# Patient Record
Sex: Female | Born: 1935 | Race: White | Hispanic: No | Marital: Married | Smoking: Never smoker
Health system: Southern US, Community
[De-identification: ages and names within clinical notes are randomized; demographics above are authoritative.]

## PROBLEM LIST (undated history)

## (undated) DIAGNOSIS — Z531 Procedure and treatment not carried out because of patient's decision for reasons of belief and group pressure: Secondary | ICD-10-CM

## (undated) DIAGNOSIS — F0150 Vascular dementia without behavioral disturbance: Secondary | ICD-10-CM

## (undated) DIAGNOSIS — IMO0001 Reserved for inherently not codable concepts without codable children: Secondary | ICD-10-CM

## (undated) DIAGNOSIS — I499 Cardiac arrhythmia, unspecified: Secondary | ICD-10-CM

## (undated) DIAGNOSIS — E039 Hypothyroidism, unspecified: Secondary | ICD-10-CM

## (undated) DIAGNOSIS — R609 Edema, unspecified: Secondary | ICD-10-CM

## (undated) DIAGNOSIS — G4730 Sleep apnea, unspecified: Secondary | ICD-10-CM

## (undated) DIAGNOSIS — I4891 Unspecified atrial fibrillation: Secondary | ICD-10-CM

## (undated) DIAGNOSIS — G459 Transient cerebral ischemic attack, unspecified: Secondary | ICD-10-CM

## (undated) DIAGNOSIS — R32 Unspecified urinary incontinence: Secondary | ICD-10-CM

## (undated) DIAGNOSIS — I639 Cerebral infarction, unspecified: Secondary | ICD-10-CM

## (undated) DIAGNOSIS — F0280 Dementia in other diseases classified elsewhere without behavioral disturbance: Secondary | ICD-10-CM

## (undated) HISTORY — PX: COLONOSCOPY: SHX174

## (undated) HISTORY — PX: FOOT SURGERY: SHX648

## (undated) HISTORY — PX: CHOLECYSTECTOMY: SHX55

---

## 2005-03-26 ENCOUNTER — Ambulatory Visit: Payer: Self-pay | Admitting: Internal Medicine

## 2005-04-19 ENCOUNTER — Ambulatory Visit: Payer: Self-pay | Admitting: Internal Medicine

## 2005-08-23 ENCOUNTER — Ambulatory Visit: Payer: Self-pay

## 2006-01-16 ENCOUNTER — Ambulatory Visit: Payer: Self-pay | Admitting: Gastroenterology

## 2006-05-01 ENCOUNTER — Ambulatory Visit: Payer: Self-pay | Admitting: Internal Medicine

## 2007-01-22 ENCOUNTER — Ambulatory Visit: Payer: Self-pay | Admitting: Internal Medicine

## 2007-07-08 ENCOUNTER — Ambulatory Visit: Payer: Self-pay | Admitting: Physician Assistant

## 2007-07-19 ENCOUNTER — Ambulatory Visit: Payer: Self-pay | Admitting: Internal Medicine

## 2008-01-28 ENCOUNTER — Ambulatory Visit: Payer: Self-pay | Admitting: Internal Medicine

## 2009-03-21 ENCOUNTER — Ambulatory Visit: Payer: Self-pay | Admitting: Internal Medicine

## 2010-05-22 ENCOUNTER — Ambulatory Visit: Payer: Self-pay | Admitting: Internal Medicine

## 2010-08-17 ENCOUNTER — Observation Stay: Payer: Self-pay | Admitting: Internal Medicine

## 2011-07-22 ENCOUNTER — Ambulatory Visit: Payer: Self-pay | Admitting: Internal Medicine

## 2012-01-23 ENCOUNTER — Ambulatory Visit: Payer: Self-pay | Admitting: Gastroenterology

## 2012-01-24 LAB — PATHOLOGY REPORT

## 2012-09-21 ENCOUNTER — Ambulatory Visit: Payer: Self-pay | Admitting: Internal Medicine

## 2013-09-02 ENCOUNTER — Ambulatory Visit: Payer: Self-pay | Admitting: Internal Medicine

## 2014-02-07 DIAGNOSIS — I4891 Unspecified atrial fibrillation: Secondary | ICD-10-CM | POA: Diagnosis not present

## 2014-04-21 DIAGNOSIS — G4733 Obstructive sleep apnea (adult) (pediatric): Secondary | ICD-10-CM | POA: Diagnosis not present

## 2014-04-21 DIAGNOSIS — I4891 Unspecified atrial fibrillation: Secondary | ICD-10-CM | POA: Diagnosis not present

## 2014-04-21 DIAGNOSIS — E039 Hypothyroidism, unspecified: Secondary | ICD-10-CM | POA: Diagnosis not present

## 2014-04-21 DIAGNOSIS — N3941 Urge incontinence: Secondary | ICD-10-CM | POA: Diagnosis not present

## 2014-04-28 DIAGNOSIS — E039 Hypothyroidism, unspecified: Secondary | ICD-10-CM | POA: Diagnosis not present

## 2014-04-28 DIAGNOSIS — I480 Paroxysmal atrial fibrillation: Secondary | ICD-10-CM | POA: Diagnosis not present

## 2014-04-28 DIAGNOSIS — G4733 Obstructive sleep apnea (adult) (pediatric): Secondary | ICD-10-CM | POA: Diagnosis not present

## 2014-04-28 DIAGNOSIS — N3941 Urge incontinence: Secondary | ICD-10-CM | POA: Diagnosis not present

## 2014-05-24 DIAGNOSIS — I4891 Unspecified atrial fibrillation: Secondary | ICD-10-CM | POA: Diagnosis not present

## 2014-06-03 DIAGNOSIS — N390 Urinary tract infection, site not specified: Secondary | ICD-10-CM | POA: Diagnosis not present

## 2014-06-03 DIAGNOSIS — E039 Hypothyroidism, unspecified: Secondary | ICD-10-CM | POA: Diagnosis not present

## 2014-06-03 DIAGNOSIS — N3941 Urge incontinence: Secondary | ICD-10-CM | POA: Diagnosis not present

## 2014-06-03 DIAGNOSIS — R300 Dysuria: Secondary | ICD-10-CM | POA: Diagnosis not present

## 2014-06-03 DIAGNOSIS — G4733 Obstructive sleep apnea (adult) (pediatric): Secondary | ICD-10-CM | POA: Diagnosis not present

## 2014-06-03 DIAGNOSIS — I480 Paroxysmal atrial fibrillation: Secondary | ICD-10-CM | POA: Diagnosis not present

## 2014-06-07 DIAGNOSIS — I4891 Unspecified atrial fibrillation: Secondary | ICD-10-CM | POA: Diagnosis not present

## 2014-06-21 DIAGNOSIS — I480 Paroxysmal atrial fibrillation: Secondary | ICD-10-CM | POA: Diagnosis not present

## 2014-07-21 DIAGNOSIS — I480 Paroxysmal atrial fibrillation: Secondary | ICD-10-CM | POA: Diagnosis not present

## 2014-08-23 DIAGNOSIS — I480 Paroxysmal atrial fibrillation: Secondary | ICD-10-CM | POA: Diagnosis not present

## 2014-09-09 DIAGNOSIS — I480 Paroxysmal atrial fibrillation: Secondary | ICD-10-CM | POA: Diagnosis not present

## 2014-09-09 DIAGNOSIS — G4733 Obstructive sleep apnea (adult) (pediatric): Secondary | ICD-10-CM | POA: Diagnosis not present

## 2014-09-09 DIAGNOSIS — N3941 Urge incontinence: Secondary | ICD-10-CM | POA: Diagnosis not present

## 2014-09-09 DIAGNOSIS — E039 Hypothyroidism, unspecified: Secondary | ICD-10-CM | POA: Diagnosis not present

## 2014-09-16 DIAGNOSIS — I480 Paroxysmal atrial fibrillation: Secondary | ICD-10-CM | POA: Diagnosis not present

## 2014-09-16 DIAGNOSIS — E039 Hypothyroidism, unspecified: Secondary | ICD-10-CM | POA: Diagnosis not present

## 2014-09-16 DIAGNOSIS — Z0000 Encounter for general adult medical examination without abnormal findings: Secondary | ICD-10-CM | POA: Diagnosis not present

## 2014-09-16 DIAGNOSIS — N3941 Urge incontinence: Secondary | ICD-10-CM | POA: Diagnosis not present

## 2014-09-22 DIAGNOSIS — I480 Paroxysmal atrial fibrillation: Secondary | ICD-10-CM | POA: Diagnosis not present

## 2014-11-11 DIAGNOSIS — Z23 Encounter for immunization: Secondary | ICD-10-CM | POA: Diagnosis not present

## 2014-11-23 DIAGNOSIS — I480 Paroxysmal atrial fibrillation: Secondary | ICD-10-CM | POA: Diagnosis not present

## 2015-01-11 DIAGNOSIS — R0789 Other chest pain: Secondary | ICD-10-CM | POA: Diagnosis not present

## 2015-01-11 DIAGNOSIS — I480 Paroxysmal atrial fibrillation: Secondary | ICD-10-CM | POA: Diagnosis not present

## 2015-01-11 DIAGNOSIS — R110 Nausea: Secondary | ICD-10-CM | POA: Diagnosis not present

## 2015-01-11 DIAGNOSIS — R079 Chest pain, unspecified: Secondary | ICD-10-CM | POA: Diagnosis not present

## 2015-01-24 DIAGNOSIS — H2513 Age-related nuclear cataract, bilateral: Secondary | ICD-10-CM | POA: Diagnosis not present

## 2015-02-16 DIAGNOSIS — I480 Paroxysmal atrial fibrillation: Secondary | ICD-10-CM | POA: Diagnosis not present

## 2015-02-16 DIAGNOSIS — E039 Hypothyroidism, unspecified: Secondary | ICD-10-CM | POA: Diagnosis not present

## 2015-02-16 DIAGNOSIS — Z6841 Body Mass Index (BMI) 40.0 and over, adult: Secondary | ICD-10-CM | POA: Diagnosis not present

## 2015-02-16 DIAGNOSIS — G4733 Obstructive sleep apnea (adult) (pediatric): Secondary | ICD-10-CM | POA: Diagnosis not present

## 2015-02-16 DIAGNOSIS — N3941 Urge incontinence: Secondary | ICD-10-CM | POA: Diagnosis not present

## 2015-03-02 DIAGNOSIS — I482 Chronic atrial fibrillation: Secondary | ICD-10-CM | POA: Diagnosis not present

## 2015-03-02 DIAGNOSIS — R410 Disorientation, unspecified: Secondary | ICD-10-CM | POA: Diagnosis not present

## 2015-03-02 DIAGNOSIS — K219 Gastro-esophageal reflux disease without esophagitis: Secondary | ICD-10-CM | POA: Diagnosis not present

## 2015-03-02 DIAGNOSIS — I517 Cardiomegaly: Secondary | ICD-10-CM | POA: Diagnosis not present

## 2015-03-02 DIAGNOSIS — R4182 Altered mental status, unspecified: Secondary | ICD-10-CM | POA: Diagnosis not present

## 2015-03-02 DIAGNOSIS — R51 Headache: Secondary | ICD-10-CM | POA: Diagnosis not present

## 2015-03-02 DIAGNOSIS — M1990 Unspecified osteoarthritis, unspecified site: Secondary | ICD-10-CM | POA: Diagnosis not present

## 2015-03-02 DIAGNOSIS — R791 Abnormal coagulation profile: Secondary | ICD-10-CM | POA: Diagnosis not present

## 2015-03-02 DIAGNOSIS — I4891 Unspecified atrial fibrillation: Secondary | ICD-10-CM | POA: Diagnosis not present

## 2015-03-02 DIAGNOSIS — N390 Urinary tract infection, site not specified: Secondary | ICD-10-CM | POA: Diagnosis not present

## 2015-03-02 DIAGNOSIS — E785 Hyperlipidemia, unspecified: Secondary | ICD-10-CM | POA: Diagnosis not present

## 2015-03-02 DIAGNOSIS — G9389 Other specified disorders of brain: Secondary | ICD-10-CM | POA: Diagnosis not present

## 2015-03-02 DIAGNOSIS — I639 Cerebral infarction, unspecified: Secondary | ICD-10-CM | POA: Diagnosis not present

## 2015-03-02 DIAGNOSIS — R4781 Slurred speech: Secondary | ICD-10-CM | POA: Diagnosis not present

## 2015-03-02 DIAGNOSIS — I10 Essential (primary) hypertension: Secondary | ICD-10-CM | POA: Diagnosis not present

## 2015-03-02 DIAGNOSIS — Z8673 Personal history of transient ischemic attack (TIA), and cerebral infarction without residual deficits: Secondary | ICD-10-CM | POA: Diagnosis not present

## 2015-03-02 DIAGNOSIS — G459 Transient cerebral ischemic attack, unspecified: Secondary | ICD-10-CM | POA: Diagnosis not present

## 2015-03-02 DIAGNOSIS — Z7901 Long term (current) use of anticoagulants: Secondary | ICD-10-CM | POA: Diagnosis not present

## 2015-04-05 DIAGNOSIS — H2513 Age-related nuclear cataract, bilateral: Secondary | ICD-10-CM | POA: Diagnosis not present

## 2015-04-05 DIAGNOSIS — I480 Paroxysmal atrial fibrillation: Secondary | ICD-10-CM | POA: Diagnosis not present

## 2015-04-06 DIAGNOSIS — N3941 Urge incontinence: Secondary | ICD-10-CM | POA: Diagnosis not present

## 2015-04-06 DIAGNOSIS — I480 Paroxysmal atrial fibrillation: Secondary | ICD-10-CM | POA: Diagnosis not present

## 2015-04-06 DIAGNOSIS — G4733 Obstructive sleep apnea (adult) (pediatric): Secondary | ICD-10-CM | POA: Diagnosis not present

## 2015-04-06 DIAGNOSIS — Z8673 Personal history of transient ischemic attack (TIA), and cerebral infarction without residual deficits: Secondary | ICD-10-CM | POA: Diagnosis not present

## 2015-04-06 DIAGNOSIS — E039 Hypothyroidism, unspecified: Secondary | ICD-10-CM | POA: Diagnosis not present

## 2015-04-06 DIAGNOSIS — Z6841 Body Mass Index (BMI) 40.0 and over, adult: Secondary | ICD-10-CM | POA: Diagnosis not present

## 2015-04-06 DIAGNOSIS — H2589 Other age-related cataract: Secondary | ICD-10-CM | POA: Diagnosis not present

## 2015-04-06 DIAGNOSIS — Z01818 Encounter for other preprocedural examination: Secondary | ICD-10-CM | POA: Diagnosis not present

## 2015-04-13 NOTE — Pre-Procedure Instructions (Signed)
CLEARED BY DR Ginette Pitman 04/06/15

## 2015-04-20 ENCOUNTER — Ambulatory Visit: Payer: Commercial Managed Care - HMO | Admitting: Anesthesiology

## 2015-04-20 ENCOUNTER — Encounter
Admission: RE | Disposition: A | Discharge status: Home / Self Care | Payer: Self-pay | Source: Ambulatory Visit | Attending: Ophthalmology

## 2015-04-20 ENCOUNTER — Ambulatory Visit
Admission: RE | Admit: 2015-04-20 | Discharge: 2015-04-20 | Disposition: A | Discharge status: Home / Self Care | Payer: Commercial Managed Care - HMO | Source: Ambulatory Visit | Attending: Ophthalmology | Admitting: Ophthalmology

## 2015-04-20 DIAGNOSIS — H2511 Age-related nuclear cataract, right eye: Secondary | ICD-10-CM | POA: Diagnosis not present

## 2015-04-20 DIAGNOSIS — I499 Cardiac arrhythmia, unspecified: Secondary | ICD-10-CM | POA: Insufficient documentation

## 2015-04-20 DIAGNOSIS — E039 Hypothyroidism, unspecified: Secondary | ICD-10-CM | POA: Insufficient documentation

## 2015-04-20 DIAGNOSIS — Z8673 Personal history of transient ischemic attack (TIA), and cerebral infarction without residual deficits: Secondary | ICD-10-CM | POA: Insufficient documentation

## 2015-04-20 DIAGNOSIS — I4891 Unspecified atrial fibrillation: Secondary | ICD-10-CM | POA: Diagnosis not present

## 2015-04-20 DIAGNOSIS — G4730 Sleep apnea, unspecified: Secondary | ICD-10-CM | POA: Diagnosis not present

## 2015-04-20 DIAGNOSIS — Z9049 Acquired absence of other specified parts of digestive tract: Secondary | ICD-10-CM | POA: Diagnosis not present

## 2015-04-20 DIAGNOSIS — Z91041 Radiographic dye allergy status: Secondary | ICD-10-CM | POA: Diagnosis not present

## 2015-04-20 DIAGNOSIS — H2513 Age-related nuclear cataract, bilateral: Secondary | ICD-10-CM | POA: Diagnosis not present

## 2015-04-20 HISTORY — PX: CATARACT EXTRACTION W/PHACO: SHX586

## 2015-04-20 HISTORY — DX: Hypothyroidism, unspecified: E03.9

## 2015-04-20 HISTORY — DX: Cardiac arrhythmia, unspecified: I49.9

## 2015-04-20 HISTORY — DX: Reserved for inherently not codable concepts without codable children: IMO0001

## 2015-04-20 HISTORY — DX: Sleep apnea, unspecified: G47.30

## 2015-04-20 HISTORY — DX: Cerebral infarction, unspecified: I63.9

## 2015-04-20 HISTORY — DX: Transient cerebral ischemic attack, unspecified: G45.9

## 2015-04-20 SURGERY — PHACOEMULSIFICATION, CATARACT, WITH IOL INSERTION
Anesthesia: Monitor Anesthesia Care | Laterality: Right | Wound class: Clean

## 2015-04-20 MED ORDER — EPINEPHRINE HCL 1 MG/ML IJ SOLN
INTRAMUSCULAR | Status: DC | PRN
  Administered 2015-04-20: 250 mL via OPHTHALMIC

## 2015-04-20 MED ORDER — NA HYALUR & NA CHOND-NA HYALUR 0.4-0.35 ML IO KIT
PACK | INTRAOCULAR | Status: DC | PRN
  Administered 2015-04-20: .75 mL via INTRAOCULAR

## 2015-04-20 MED ORDER — NA HYALUR & NA CHOND-NA HYALUR 0.55-0.5 ML IO KIT
PACK | INTRAOCULAR | Status: AC
  Filled 2015-04-20: qty 1.05

## 2015-04-20 MED ORDER — CEFUROXIME OPHTHALMIC INJECTION 1 MG/0.1 ML
INJECTION | OPHTHALMIC | Status: AC
  Filled 2015-04-20: qty 0.1

## 2015-04-20 MED ORDER — EPINEPHRINE HCL 1 MG/ML IJ SOLN
INTRAMUSCULAR | Status: AC
  Filled 2015-04-20: qty 1

## 2015-04-20 MED ORDER — POVIDONE-IODINE 5 % OP SOLN
OPHTHALMIC | Status: AC
  Administered 2015-04-20: 1 via OPHTHALMIC
  Filled 2015-04-20: qty 30

## 2015-04-20 MED ORDER — POVIDONE-IODINE 5 % OP SOLN
1.0000 "application " | OPHTHALMIC | Status: AC | PRN
  Administered 2015-04-20: 1 via OPHTHALMIC

## 2015-04-20 MED ORDER — CEFUROXIME OPHTHALMIC INJECTION 1 MG/0.1 ML
INJECTION | OPHTHALMIC | Status: DC | PRN
  Administered 2015-04-20: 0.1 mL via INTRACAMERAL

## 2015-04-20 MED ORDER — ARMC OPHTHALMIC DILATING GEL
OPHTHALMIC | Status: AC
  Administered 2015-04-20: 1 via OPHTHALMIC
  Filled 2015-04-20: qty 0.25

## 2015-04-20 MED ORDER — TETRACAINE HCL 0.5 % OP SOLN
1.0000 [drp] | OPHTHALMIC | Status: AC | PRN
  Administered 2015-04-20: 1 [drp] via OPHTHALMIC

## 2015-04-20 MED ORDER — NEOMYCIN-POLYMYXIN-DEXAMETH 3.5-10000-0.1 OP OINT
TOPICAL_OINTMENT | OPHTHALMIC | Status: AC
  Filled 2015-04-20: qty 3.5

## 2015-04-20 MED ORDER — CARBACHOL 0.01 % IO SOLN
INTRAOCULAR | Status: DC | PRN
  Administered 2015-04-20: 0.5 mL via INTRAOCULAR

## 2015-04-20 MED ORDER — MOXIFLOXACIN HCL 0.5 % OP SOLN: 1.0000 [drp] | Freq: Once | OPHTHALMIC | Status: DC

## 2015-04-20 MED ORDER — LABETALOL HCL 5 MG/ML IV SOLN
INTRAVENOUS | Status: DC | PRN
  Administered 2015-04-20 (×2): 5 mg via INTRAVENOUS

## 2015-04-20 MED ORDER — SODIUM CHLORIDE 0.9 % IV SOLN
INTRAVENOUS | Status: DC
  Administered 2015-04-20: 07:00:00 via INTRAVENOUS

## 2015-04-20 MED ORDER — NEOMYCIN-POLYMYXIN-DEXAMETH 0.1 % OP OINT
TOPICAL_OINTMENT | OPHTHALMIC | Status: DC | PRN
  Administered 2015-04-20: 1 via OPHTHALMIC

## 2015-04-20 MED ORDER — MOXIFLOXACIN HCL 0.5 % OP SOLN
OPHTHALMIC | Status: AC
  Filled 2015-04-20: qty 3

## 2015-04-20 MED ORDER — TETRACAINE HCL 0.5 % OP SOLN
OPHTHALMIC | Status: AC
  Administered 2015-04-20: 1 [drp] via OPHTHALMIC
  Filled 2015-04-20: qty 2

## 2015-04-20 MED ORDER — ARMC OPHTHALMIC DILATING GEL
1.0000 "application " | OPHTHALMIC | Status: DC | PRN
  Administered 2015-04-20: 1 via OPHTHALMIC

## 2015-04-20 SURGICAL SUPPLY — 23 items
BSS 15ML ×3 IMPLANT
CANNULA ANT/CHMB 27GA (MISCELLANEOUS) ×3 IMPLANT
CUP MEDICINE 2OZ PLAST GRAD ST (MISCELLANEOUS) IMPLANT
GLOVE BIO SURGEON STRL SZ8 (GLOVE) ×3 IMPLANT
GLOVE BIOGEL M 6.5 STRL (GLOVE) ×3 IMPLANT
GLOVE SURG LX 7.5 STRW (GLOVE) ×2
GLOVE SURG LX STRL 7.5 STRW (GLOVE) ×1 IMPLANT
GOWN STRL REUS W/ TWL LRG LVL3 (GOWN DISPOSABLE) ×2 IMPLANT
GOWN STRL REUS W/TWL LRG LVL3 (GOWN DISPOSABLE) ×4
LENS IOL TECNIS 21.5 (Intraocular Lens) ×3 IMPLANT
LENS IOL TECNIS MONO 1P 21.5 (Intraocular Lens) ×1 IMPLANT
PACK CATARACT (MISCELLANEOUS) ×3 IMPLANT
PACK CATARACT BRASINGTON LX (MISCELLANEOUS) ×3 IMPLANT
PACK EYE AFTER SURG (MISCELLANEOUS) ×3 IMPLANT
SOL BSS BAG (MISCELLANEOUS) ×3
SOL PREP PVP 2OZ (MISCELLANEOUS)
SOLUTION BSS BAG (MISCELLANEOUS) ×1 IMPLANT
SOLUTION PREP PVP 2OZ (MISCELLANEOUS) IMPLANT
SYR 3ML LL SCALE MARK (SYRINGE) ×3 IMPLANT
SYR 5ML LL (SYRINGE) ×6 IMPLANT
SYR TB 1ML 27GX1/2 LL (SYRINGE) ×3 IMPLANT
WATER STERILE IRR 1000ML POUR (IV SOLUTION) ×3 IMPLANT
WIPE NON LINTING 3.25X3.25 (MISCELLANEOUS) ×3 IMPLANT

## 2015-04-20 NOTE — Op Note (Signed)
OPERATIVE NOTE  Nicole Walton [id number] 04/20/2015   PREOPERATIVE DIAGNOSIS:  Nuclear Sclerotic Cataract Right Eye H25.11   POSTOPERATIVE DIAGNOSIS: Nuclear Sclerotic Cataract Right Eye H25.11          PROCEDURE:  Phacoemusification with posterior chamber intraocular lens placement of the right eye   LENS:   Implant Name Type Inv. Item Serial No. Manufacturer Lot No. LRB No. Used  LENS IOL TECNIS 21.5 - [id number] Intraocular Lens LENS IOL TECNIS 21.5 [id number]   Right 1       ULTRASOUND TIME: 16 %  of 1 minutes 12 seconds, CDE 11.7  SURGEON:  Wyonia Hough, MD   ANESTHESIA:  Topical with tetracaine drops and 2% Xylocaine jelly.   COMPLICATIONS:  None.   DESCRIPTION OF PROCEDURE:  The patient was identified in the holding room and transported to the operating room and placed in the supine position under the operating microscope. Theright eye was identified as the operative eye and it was prepped and draped in the usual sterile ophthalmic fashion.   A 1 millimeter clear-corneal paracentesis was made at the 12:00 position.  The anterior chamber was filled with Viscoat viscoelastic.  A 2.4 millimeter keratome was used to make a near-clear corneal incision at the 9:00 position. A curvilinear capsulorrhexis was made with a cystotome and capsulorrhexis forceps.  Balanced salt solution was used to hydrodissect and hydrodelineate the nucleus.   Phacoemulsification was then used in stop and chop fashion to remove the lens nucleus and epinucleus.  The remaining cortex was then removed using the irrigation and aspiration handpiece. Provisc was then placed into the capsular bag to distend it for lens placement.  A lens was then injected into the capsular bag.  The remaining viscoelastic was aspirated.  Wounds were hydrated with balanced salt solution.  The anterior chamber was inflated to a physiologic pressure with balanced salt solution. Cefuroxime 0.1 ml of a 10mg /ml solution  was injected into the anterior chamber for a dose of 1 mg of intracameral antibiotic at the completion of the case. Miostat was placed into the anterior chamber to constrict the pupil.  No wound leaks were noted.  Topical Vigamox drops and Maxitrol ointment were applied to the eye.  The patient was taken to the recovery room in stable condition without complications of anesthesia or surgery.  [name] 04/20/2015, 8:58 AM

## 2015-04-20 NOTE — Anesthesia Preprocedure Evaluation (Signed)
Anesthesia Evaluation  Patient identified by MRN, date of birth, ID band Patient awake    Reviewed: Allergy & Precautions, H&P , NPO status , Patient's Chart, lab work & pertinent test results  History of Anesthesia Complications Negative for: history of anesthetic complications  Airway Mallampati: III  TM Distance: >3 FB Neck ROM: limited    Dental  (+) Poor Dentition, Chipped, Missing, Upper Dentures, Lower Dentures   Pulmonary shortness of breath, sleep apnea and Continuous Positive Airway Pressure Ventilation ,    Pulmonary exam normal breath sounds clear to auscultation       Cardiovascular Exercise Tolerance: Good (-) angina(-) Past MI and (-) DOE Normal cardiovascular exam+ dysrhythmias  Rhythm:regular Rate:Normal     Neuro/Psych TIACVA negative psych ROS   GI/Hepatic negative GI ROS, Neg liver ROS, neg GERD  ,  Endo/Other  Hypothyroidism   Renal/GU negative Renal ROS  negative genitourinary   Musculoskeletal   Abdominal   Peds  Hematology negative hematology ROS (+)   Anesthesia Other Findings Past Medical History:   Sleep apnea                                                  Shortness of breath dyspnea                                    Comment:with exersion   Dysrhythmia                                                    Comment:A-fib   Stroke (HCC)                                                 TIA (transient ischemic attack)                              Hypothyroidism                                              Past Surgical History:   CESAREAN SECTION                                              CHOLECYSTECTOMY                                               FOOT SURGERY  BMI    Body Mass Index   38.62 kg/m 2      Reproductive/Obstetrics negative OB ROS                             Anesthesia Physical Anesthesia  Plan  ASA: III  Anesthesia Plan: MAC   Post-op Pain Management:    Induction:   Airway Management Planned:   Additional Equipment:   Intra-op Plan:   Post-operative Plan:   Informed Consent: I have reviewed the patients History and Physical, chart, labs and discussed the procedure including the risks, benefits and alternatives for the proposed anesthesia with the patient or authorized representative who has indicated his/her understanding and acceptance.   Dental Advisory Given  Plan Discussed with: Anesthesiologist, CRNA and Surgeon  Anesthesia Plan Comments:         Anesthesia Quick Evaluation

## 2015-04-20 NOTE — Discharge Instructions (Signed)
AMBULATORY SURGERY  DISCHARGE INSTRUCTIONS   1) The drugs that you were given will stay in your system until tomorrow so for the next 24 hours you should not:  A) Drive an automobile B) Make any legal decisions C) Drink any alcoholic beverage   2) You may resume regular meals tomorrow.  Today it is better to start with liquids and gradually work up to solid foods.  You may eat anything you prefer, but it is better to start with liquids, then soup and crackers, and gradually work up to solid foods.   3) Please notify your doctor immediately if you have any unusual bleeding, trouble breathing, redness and pain at the surgery site, drainage, fever, or pain not relieved by medication.    4) Additional Instructions:    Eye Surgery Discharge Instructions  Expect mild scratchy sensation or mild soreness. DO NOT RUB YOUR EYE!  The day of surgery:  Minimal physical activity, but bed rest is not required  No reading, computer work, or close hand work  No bending, lifting, or straining.  May watch TV  For 24 hours:  No driving, legal decisions, or alcoholic beverages  Safety precautions  Eat anything you prefer: It is better to start with liquids, then soup then solid foods.  _____ Eye patch should be worn until postoperative exam tomorrow.  ____ Solar shield eyeglasses should be worn for comfort in the sunlight/patch while sleeping  Resume all regular medications including aspirin or Coumadin if these were discontinued prior to surgery. You may shower, bathe, shave, or wash your hair. Tylenol may be taken for mild discomfort.  Call your doctor if you experience significant pain, nausea, or vomiting, fever > 101 or other signs of infection. [phone] or [phone] Specific instructions:  Follow-up Information    Follow up with Leandrew Koyanagi, MD In 1 day.   Specialty:  Ophthalmology   Why:  March 17 at 10:40am   Contact information:   [address]   [place] [address] [phone]         Please contact your physician with any problems or Same Day Surgery at [phone], Monday through Friday 6 am to 4 pm, or [place] at [facility] number at [phone].

## 2015-04-20 NOTE — Transfer of Care (Signed)
Immediate Anesthesia Transfer of Care Note  Patient: Nicole Walton  Procedure(s) Performed: Procedure(s) with comments: CATARACT EXTRACTION PHACO AND INTRAOCULAR LENS PLACEMENT (IOC) (Right) - [place]   1:12.1 AP%  16.2 CDE   11.69 fluid cassette lot# [id number]   exp.07/04/2016  Patient Location: PACU  Anesthesia Type:MAC  Level of Consciousness: awake, alert  and oriented  Airway & Oxygen Therapy: Patient Spontanous Breathing  Post-op Assessment: Report given to RN and Post -op Vital signs reviewed and stable  Post vital signs: stable  Last Vitals:  Filed Vitals:   04/20/15 0705 04/20/15 0903  BP: 172/89 166/92  Pulse: 63 68  Temp: 37 C 36.9 C  Resp: 16 12    Complications: No apparent anesthesia complications

## 2015-04-20 NOTE — OR Nursing (Signed)
Patient reports to her knowledge  Never taking Cipro however she list it as an allergy because her son had an allergy to it.  Discussed with Dr Wallace Going.  Order received to proceed with ordered drops.

## 2015-04-20 NOTE — H&P (Signed)
  The History and Physical notes are on paper, have been signed, and are to be scanned. The patient remains stable and unchanged from the H&P.   Previous H&P reviewed, patient examined, and there are no changes.  Gwenlyn Hottinger 04/20/2015 8:36 AM

## 2015-04-20 NOTE — Anesthesia Postprocedure Evaluation (Signed)
Anesthesia Post Note  Patient: Nicole Walton  Procedure(s) Performed: Procedure(s) (LRB): CATARACT EXTRACTION PHACO AND INTRAOCULAR LENS PLACEMENT (IOC) (Right)  Patient location during evaluation: PACU Anesthesia Type: MAC Level of consciousness: awake and alert and oriented Pain management: pain level controlled Vital Signs Assessment: post-procedure vital signs reviewed and stable Respiratory status: spontaneous breathing Cardiovascular status: blood pressure returned to baseline Anesthetic complications: no    Last Vitals:  Filed Vitals:   04/20/15 0705 04/20/15 0903  BP: 172/89 166/92  Pulse: 63 68  Temp: 37 C 36.9 C  Resp: 16 12    Last Pain: There were no vitals filed for this visit.               Estill Batten

## 2015-05-08 DIAGNOSIS — I480 Paroxysmal atrial fibrillation: Secondary | ICD-10-CM | POA: Diagnosis not present

## 2015-05-25 DIAGNOSIS — H2512 Age-related nuclear cataract, left eye: Secondary | ICD-10-CM | POA: Diagnosis not present

## 2015-05-30 ENCOUNTER — Encounter: Payer: Self-pay | Admitting: *Deleted

## 2015-05-31 NOTE — Progress Notes (Signed)
Called [facility] about allergy to Cipro. [facility] returned call to state this is patients 2nd eye and with her first eye she did well with medications as ordered with no reactions so continue to use medications for eye as ordered this time as well.

## 2015-06-01 ENCOUNTER — Ambulatory Visit
Admission: RE | Admit: 2015-06-01 | Discharge: 2015-06-01 | Disposition: A | Discharge status: Home / Self Care | Payer: Commercial Managed Care - HMO | Source: Ambulatory Visit | Attending: Ophthalmology | Admitting: Ophthalmology

## 2015-06-01 ENCOUNTER — Ambulatory Visit: Payer: Commercial Managed Care - HMO | Admitting: Anesthesiology

## 2015-06-01 ENCOUNTER — Encounter
Admission: RE | Disposition: A | Discharge status: Home / Self Care | Payer: Self-pay | Source: Ambulatory Visit | Attending: Ophthalmology

## 2015-06-01 DIAGNOSIS — Z9841 Cataract extraction status, right eye: Secondary | ICD-10-CM | POA: Insufficient documentation

## 2015-06-01 DIAGNOSIS — G4730 Sleep apnea, unspecified: Secondary | ICD-10-CM | POA: Insufficient documentation

## 2015-06-01 DIAGNOSIS — Z9049 Acquired absence of other specified parts of digestive tract: Secondary | ICD-10-CM | POA: Insufficient documentation

## 2015-06-01 DIAGNOSIS — Z9109 Other allergy status, other than to drugs and biological substances: Secondary | ICD-10-CM | POA: Diagnosis not present

## 2015-06-01 DIAGNOSIS — Z79891 Long term (current) use of opiate analgesic: Secondary | ICD-10-CM | POA: Insufficient documentation

## 2015-06-01 DIAGNOSIS — E039 Hypothyroidism, unspecified: Secondary | ICD-10-CM | POA: Insufficient documentation

## 2015-06-01 DIAGNOSIS — Z79899 Other long term (current) drug therapy: Secondary | ICD-10-CM | POA: Diagnosis not present

## 2015-06-01 DIAGNOSIS — Z7902 Long term (current) use of antithrombotics/antiplatelets: Secondary | ICD-10-CM | POA: Diagnosis not present

## 2015-06-01 DIAGNOSIS — Z7901 Long term (current) use of anticoagulants: Secondary | ICD-10-CM | POA: Insufficient documentation

## 2015-06-01 DIAGNOSIS — Z9889 Other specified postprocedural states: Secondary | ICD-10-CM | POA: Insufficient documentation

## 2015-06-01 DIAGNOSIS — H269 Unspecified cataract: Secondary | ICD-10-CM | POA: Diagnosis present

## 2015-06-01 DIAGNOSIS — Z91041 Radiographic dye allergy status: Secondary | ICD-10-CM | POA: Diagnosis not present

## 2015-06-01 DIAGNOSIS — H2512 Age-related nuclear cataract, left eye: Secondary | ICD-10-CM | POA: Diagnosis not present

## 2015-06-01 DIAGNOSIS — Z8673 Personal history of transient ischemic attack (TIA), and cerebral infarction without residual deficits: Secondary | ICD-10-CM | POA: Insufficient documentation

## 2015-06-01 DIAGNOSIS — I4891 Unspecified atrial fibrillation: Secondary | ICD-10-CM | POA: Insufficient documentation

## 2015-06-01 HISTORY — DX: Unspecified urinary incontinence: R32

## 2015-06-01 HISTORY — DX: Edema, unspecified: R60.9

## 2015-06-01 HISTORY — PX: CATARACT EXTRACTION W/PHACO: SHX586

## 2015-06-01 SURGERY — PHACOEMULSIFICATION, CATARACT, WITH IOL INSERTION
Anesthesia: Monitor Anesthesia Care | Site: Eye | Laterality: Left | Wound class: Clean

## 2015-06-01 MED ORDER — LIDOCAINE HCL (PF) 4 % IJ SOLN
INTRAMUSCULAR | Status: AC
  Filled 2015-06-01: qty 5

## 2015-06-01 MED ORDER — MOXIFLOXACIN HCL 0.5 % OP SOLN: 1.0000 [drp] | OPHTHALMIC | Status: DC | PRN

## 2015-06-01 MED ORDER — NEOMYCIN-POLYMYXIN-DEXAMETH 3.5-10000-0.1 OP OINT
TOPICAL_OINTMENT | OPHTHALMIC | Status: AC
  Filled 2015-06-01: qty 3.5

## 2015-06-01 MED ORDER — EPINEPHRINE HCL 1 MG/ML IJ SOLN
INTRAMUSCULAR | Status: AC
  Filled 2015-06-01: qty 1

## 2015-06-01 MED ORDER — BUPIVACAINE HCL (PF) 0.75 % IJ SOLN
INTRAMUSCULAR | Status: AC
  Filled 2015-06-01: qty 10

## 2015-06-01 MED ORDER — BSS IO SOLN
INTRAOCULAR | Status: DC | PRN
  Administered 2015-06-01: 08:00:00 via OPHTHALMIC

## 2015-06-01 MED ORDER — MOXIFLOXACIN HCL 0.5 % OP SOLN
OPHTHALMIC | Status: AC
  Filled 2015-06-01: qty 3

## 2015-06-01 MED ORDER — ARMC OPHTHALMIC DILATING GEL
1.0000 "application " | OPHTHALMIC | Status: AC | PRN
  Administered 2015-06-01 (×2): 1 via OPHTHALMIC

## 2015-06-01 MED ORDER — CEFUROXIME OPHTHALMIC INJECTION 1 MG/0.1 ML
INJECTION | OPHTHALMIC | Status: DC | PRN
  Administered 2015-06-01: 0.1 mL via INTRACAMERAL

## 2015-06-01 MED ORDER — CEFUROXIME OPHTHALMIC INJECTION 1 MG/0.1 ML
INJECTION | OPHTHALMIC | Status: AC
  Filled 2015-06-01: qty 0.1

## 2015-06-01 MED ORDER — NA HYALUR & NA CHOND-NA HYALUR 0.55-0.5 ML IO KIT
PACK | INTRAOCULAR | Status: AC
  Filled 2015-06-01: qty 1.05

## 2015-06-01 MED ORDER — CARBACHOL 0.01 % IO SOLN
INTRAOCULAR | Status: DC | PRN
  Administered 2015-06-01: 0.5 mL via INTRAOCULAR

## 2015-06-01 MED ORDER — POVIDONE-IODINE 5 % OP SOLN
OPHTHALMIC | Status: AC
  Administered 2015-06-01: 1 via OPHTHALMIC
  Filled 2015-06-01: qty 30

## 2015-06-01 MED ORDER — SODIUM CHLORIDE 0.9 % IV SOLN
INTRAVENOUS | Status: DC
  Administered 2015-06-01: 07:00:00 via INTRAVENOUS

## 2015-06-01 MED ORDER — POVIDONE-IODINE 5 % OP SOLN
1.0000 "application " | Freq: Once | OPHTHALMIC | Status: AC
  Administered 2015-06-01: 1 via OPHTHALMIC

## 2015-06-01 MED ORDER — NEOMYCIN-POLYMYXIN-DEXAMETH 0.1 % OP OINT
TOPICAL_OINTMENT | OPHTHALMIC | Status: DC | PRN
  Administered 2015-06-01: 1 via OPHTHALMIC

## 2015-06-01 MED ORDER — LIDOCAINE HCL (PF) 4 % IJ SOLN
INTRAMUSCULAR | Status: DC | PRN
  Administered 2015-06-01: 08:00:00 via OPHTHALMIC

## 2015-06-01 MED ORDER — TETRACAINE HCL 0.5 % OP SOLN
1.0000 [drp] | Freq: Once | OPHTHALMIC | Status: AC
  Administered 2015-06-01: 1 [drp] via OPHTHALMIC

## 2015-06-01 MED ORDER — TETRACAINE HCL 0.5 % OP SOLN
OPHTHALMIC | Status: AC
  Administered 2015-06-01: 1 [drp] via OPHTHALMIC
  Filled 2015-06-01: qty 2

## 2015-06-01 MED ORDER — LIDOCAINE HCL (PF) 1 % IJ SOLN
INTRAMUSCULAR | Status: AC
  Filled 2015-06-01: qty 2

## 2015-06-01 MED ORDER — FENTANYL CITRATE (PF) 100 MCG/2ML IJ SOLN
INTRAMUSCULAR | Status: DC | PRN
  Administered 2015-06-01: 25 ug via INTRAVENOUS

## 2015-06-01 MED ORDER — NA HYALUR & NA CHOND-NA HYALUR 0.4-0.35 ML IO KIT
PACK | INTRAOCULAR | Status: DC | PRN
  Administered 2015-06-01: .35 mL via INTRAOCULAR

## 2015-06-01 MED ORDER — ARMC OPHTHALMIC DILATING GEL
OPHTHALMIC | Status: AC
  Administered 2015-06-01: 1 via OPHTHALMIC
  Filled 2015-06-01: qty 0.25

## 2015-06-01 MED ORDER — HYALURONIDASE HUMAN 150 UNIT/ML IJ SOLN
INTRAMUSCULAR | Status: AC
  Filled 2015-06-01: qty 1

## 2015-06-01 SURGICAL SUPPLY — 22 items
CANNULA ANT/CHMB 27GA (MISCELLANEOUS) ×3 IMPLANT
CUP MEDICINE 2OZ PLAST GRAD ST (MISCELLANEOUS) ×3 IMPLANT
GLOVE BIO SURGEON STRL SZ8 (GLOVE) ×3 IMPLANT
GLOVE BIOGEL M 6.5 STRL (GLOVE) ×3 IMPLANT
GLOVE SURG LX 7.5 STRW (GLOVE) ×2
GLOVE SURG LX STRL 7.5 STRW (GLOVE) ×1 IMPLANT
GOWN STRL REUS W/ TWL LRG LVL3 (GOWN DISPOSABLE) ×2 IMPLANT
GOWN STRL REUS W/TWL LRG LVL3 (GOWN DISPOSABLE) ×4
LENS IOL TECNIS 21.5 (Intraocular Lens) ×3 IMPLANT
LENS IOL TECNIS MONO 1P 21.5 (Intraocular Lens) ×1 IMPLANT
PACK CATARACT (MISCELLANEOUS) ×3 IMPLANT
PACK CATARACT BRASINGTON LX (MISCELLANEOUS) ×3 IMPLANT
PACK EYE AFTER SURG (MISCELLANEOUS) ×3 IMPLANT
SOL BSS BAG (MISCELLANEOUS) ×3
SOL PREP PVP 2OZ (MISCELLANEOUS) ×3
SOLUTION BSS BAG (MISCELLANEOUS) ×1 IMPLANT
SOLUTION PREP PVP 2OZ (MISCELLANEOUS) ×1 IMPLANT
SYR 3ML LL SCALE MARK (SYRINGE) ×3 IMPLANT
SYR 5ML LL (SYRINGE) ×3 IMPLANT
SYR TB 1ML 27GX1/2 LL (SYRINGE) ×3 IMPLANT
WATER STERILE IRR 1000ML POUR (IV SOLUTION) ×3 IMPLANT
WIPE NON LINTING 3.25X3.25 (MISCELLANEOUS) ×3 IMPLANT

## 2015-06-01 NOTE — Op Note (Signed)
OPERATIVE NOTE  Nicole Walton [id number] 06/01/2015   PREOPERATIVE DIAGNOSIS:  Nuclear sclerotic cataract left eye. H25.12   POSTOPERATIVE DIAGNOSIS:    Nuclear sclerotic cataract left eye.     PROCEDURE:  Phacoemusification with posterior chamber intraocular lens placement of the left eye   LENS:   Implant Name Type Inv. Item Serial No. Manufacturer Lot No. LRB No. Used  LENS IOL TECNIS 21.5 - [id number] Intraocular Lens LENS IOL TECNIS 21.5 [id number]   Left 1        ULTRASOUND TIME: 10 % of 1 minutes, 17 seconds.  CDE 7.7   SURGEON:  Wyonia Hough, MD   ANESTHESIA:  Topical with tetracaine drops and 2% Xylocaine jelly, augmented with 1% preservative-free intracameral lidocaine.    COMPLICATIONS:  None.   DESCRIPTION OF PROCEDURE:  The patient was identified in the holding room and transported to the operating room and placed in the supine position under the operating microscope.  The left eye was identified as the operative eye and it was prepped and draped in the usual sterile ophthalmic fashion.   A 1 millimeter clear-corneal paracentesis was made at the 1:30 position. 0.5 ml of preservative-free 1% lidocaine was injected into the anterior chamber.  The anterior chamber was filled with Viscoat viscoelastic.  A 2.4 millimeter keratome was used to make a near-clear corneal incision at the 10:30 position.  .  A curvilinear capsulorrhexis was made with a cystotome and capsulorrhexis forceps.  Balanced salt solution was used to hydrodissect and hydrodelineate the nucleus.   Phacoemulsification was then used in stop and chop fashion to remove the lens nucleus and epinucleus.  The remaining cortex was then removed using the irrigation and aspiration handpiece. Provisc was then placed into the capsular bag to distend it for lens placement.  A lens was then injected into the capsular bag.  The remaining viscoelastic was aspirated.   Wounds were hydrated with balanced salt  solution.  The anterior chamber was inflated to a physiologic pressure with balanced salt solution. Cefuroxime 0.1 ml of a 10mg /ml solution was injected into the anterior chamber for a dose of 1 mg of intracameral antibiotic at the completion of the case.  Miostat was placed into the anterior chamber to constrict the pupil.  No wound leaks were noted.  Topical Vigamox drops and Maxitrol ointment were applied to the eye.  The patient was taken to the recovery room in stable condition without complications of anesthesia or surgery  Nicole Walton 06/01/2015, 8:34 AM

## 2015-06-01 NOTE — Anesthesia Postprocedure Evaluation (Signed)
Anesthesia Post Note  Patient: Nicole Walton  Procedure(s) Performed: Procedure(s) (LRB): CATARACT EXTRACTION PHACO AND INTRAOCULAR LENS PLACEMENT (IOC) (Left)  Patient location during evaluation: Short Stay Anesthesia Type: MAC Level of consciousness: awake and alert and oriented Pain management: pain level controlled Vital Signs Assessment: post-procedure vital signs reviewed and stable Respiratory status: spontaneous breathing Cardiovascular status: stable Postop Assessment: no signs of nausea or vomiting Anesthetic complications: no    Last Vitals:  Filed Vitals:   06/01/15 0646  BP: 162/92  Pulse: 60  Temp: 36.5 C  Resp: 16    Last Pain: There were no vitals filed for this visit.               [name]

## 2015-06-01 NOTE — Transfer of Care (Signed)
Immediate Anesthesia Transfer of Care Note  Patient: Brelyn M Boley  Procedure(s) Performed: Procedure(s) with comments: CATARACT EXTRACTION PHACO AND INTRAOCULAR LENS PLACEMENT (IOC) (Left) - [place] 1.17 AP% 9.9 CDE 765 Fluid Pack Lot # [id number]  Patient Location: Short Stay  Anesthesia Type:MAC  Level of Consciousness: awake, alert  and oriented  Airway & Oxygen Therapy: Patient Spontanous Breathing and Patient connected to nasal cannula oxygen  Post-op Assessment: Report given to RN and Post -op Vital signs reviewed and stable  Post vital signs: Reviewed and stable  Last Vitals: 0837 67 hr 99% sat 97.8 temp 106/79 bp 18 resp  Filed Vitals:   06/01/15 0646  BP: 162/92  Pulse: 60  Temp: 36.5 C  Resp: 16    Last Pain: There were no vitals filed for this visit.       Complications: No apparent anesthesia complications

## 2015-06-01 NOTE — Discharge Instructions (Signed)
AMBULATORY SURGERY  DISCHARGE INSTRUCTIONS   1) The drugs that you were given will stay in your system until tomorrow so for the next 24 hours you should not:  A) Drive an automobile B) Make any legal decisions C) Drink any alcoholic beverage   2) You may resume regular meals tomorrow.  Today it is better to start with liquids and gradually work up to solid foods.  You may eat anything you prefer, but it is better to start with liquids, then soup and crackers, and gradually work up to solid foods.   3) Please notify your doctor immediately if you have any unusual bleeding, trouble breathing, redness and pain at the surgery site, drainage, fever, or pain not relieved by medication.    4) Additional Instructions:        Please contact your physician with any problems or Same Day Surgery at [phone], Monday through Friday 6 am to 4 pm, or [place] at [facility] number at [phone].   Eye Surgery Discharge Instructions  Expect mild scratchy sensation or mild soreness. DO NOT RUB YOUR EYE!  The day of surgery:  Minimal physical activity, but bed rest is not required  No reading, computer work, or close hand work  No bending, lifting, or straining.  May watch TV  For 24 hours:  No driving, legal decisions, or alcoholic beverages  Safety precautions  Eat anything you prefer: It is better to start with liquids, then soup then solid foods.  _____ Eye patch should be worn until postoperative exam tomorrow.  ____ Solar shield eyeglasses should be worn for comfort in the sunlight/patch while sleeping  Resume all regular medications including aspirin or Coumadin if these were discontinued prior to surgery. You may shower, bathe, shave, or wash your hair. Tylenol may be taken for mild discomfort.  Call your doctor if you experience significant pain, nausea, or vomiting, fever > 101 or other signs of infection. [phone] or [phone] Specific  instructions:  Follow-up Information    Follow up with Leandrew Koyanagi, MD In 1 day.   Specialty:  Ophthalmology   Why:  April 28 at 9:35am   Contact information:   [address]   [place] [address] [phone]

## 2015-06-01 NOTE — H&P (Signed)
  The History and Physical notes are on paper, have been signed, and are to be scanned. The patient remains stable and unchanged from the H&P.   Previous H&P reviewed, patient examined, and there are no changes.  Nicole Walton 06/01/2015 7:29 AM

## 2015-06-01 NOTE — Anesthesia Preprocedure Evaluation (Signed)
Anesthesia Evaluation  Patient identified by MRN, date of birth, ID band Patient awake    Reviewed: Allergy & Precautions, H&P , NPO status , Patient's Chart, lab work & pertinent test results  History of Anesthesia Complications Negative for: history of anesthetic complications  Airway Mallampati: III  TM Distance: >3 FB Neck ROM: limited    Dental  (+) Poor Dentition, Chipped, Missing, Upper Dentures, Lower Dentures   Pulmonary shortness of breath, sleep apnea and Continuous Positive Airway Pressure Ventilation ,    Pulmonary exam normal breath sounds clear to auscultation       Cardiovascular Exercise Tolerance: Good (-) angina(-) Past MI and (-) DOE Normal cardiovascular exam+ dysrhythmias  Rhythm:regular Rate:Normal     Neuro/Psych TIACVA negative psych ROS   GI/Hepatic negative GI ROS, Neg liver ROS, neg GERD  ,  Endo/Other  Hypothyroidism   Renal/GU negative Renal ROS  negative genitourinary   Musculoskeletal   Abdominal   Peds  Hematology negative hematology ROS (+)   Anesthesia Other Findings Past Medical History:   Sleep apnea                                                  Shortness of breath dyspnea                                    Comment:with exersion   Dysrhythmia                                                    Comment:A-fib   Stroke (HCC)                                                 TIA (transient ischemic attack)                              Hypothyroidism                                              Past Surgical History:   CESAREAN SECTION                                              CHOLECYSTECTOMY                                               FOOT SURGERY  BMI    Body Mass Index   38.62 kg/m 2      Reproductive/Obstetrics negative OB ROS                             Anesthesia Physical  Anesthesia  Plan  ASA: III  Anesthesia Plan: MAC   Post-op Pain Management:    Induction:   Airway Management Planned:   Additional Equipment:   Intra-op Plan:   Post-operative Plan:   Informed Consent: I have reviewed the patients History and Physical, chart, labs and discussed the procedure including the risks, benefits and alternatives for the proposed anesthesia with the patient or authorized representative who has indicated his/her understanding and acceptance.   Dental Advisory Given  Plan Discussed with: Anesthesiologist, CRNA and Surgeon  Anesthesia Plan Comments:         Anesthesia Quick Evaluation

## 2015-06-08 DIAGNOSIS — E039 Hypothyroidism, unspecified: Secondary | ICD-10-CM | POA: Diagnosis not present

## 2015-06-08 DIAGNOSIS — N3941 Urge incontinence: Secondary | ICD-10-CM | POA: Diagnosis not present

## 2015-06-08 DIAGNOSIS — I480 Paroxysmal atrial fibrillation: Secondary | ICD-10-CM | POA: Diagnosis not present

## 2015-06-08 DIAGNOSIS — G4733 Obstructive sleep apnea (adult) (pediatric): Secondary | ICD-10-CM | POA: Diagnosis not present

## 2015-06-08 DIAGNOSIS — Z6841 Body Mass Index (BMI) 40.0 and over, adult: Secondary | ICD-10-CM | POA: Diagnosis not present

## 2015-06-15 DIAGNOSIS — M79672 Pain in left foot: Secondary | ICD-10-CM | POA: Diagnosis not present

## 2015-06-15 DIAGNOSIS — E039 Hypothyroidism, unspecified: Secondary | ICD-10-CM | POA: Diagnosis not present

## 2015-06-15 DIAGNOSIS — Z8673 Personal history of transient ischemic attack (TIA), and cerebral infarction without residual deficits: Secondary | ICD-10-CM | POA: Diagnosis not present

## 2015-06-15 DIAGNOSIS — Z23 Encounter for immunization: Secondary | ICD-10-CM | POA: Diagnosis not present

## 2015-06-15 DIAGNOSIS — Z6841 Body Mass Index (BMI) 40.0 and over, adult: Secondary | ICD-10-CM | POA: Diagnosis not present

## 2015-06-15 DIAGNOSIS — G4733 Obstructive sleep apnea (adult) (pediatric): Secondary | ICD-10-CM | POA: Diagnosis not present

## 2015-06-15 DIAGNOSIS — N3941 Urge incontinence: Secondary | ICD-10-CM | POA: Diagnosis not present

## 2015-06-15 DIAGNOSIS — I480 Paroxysmal atrial fibrillation: Secondary | ICD-10-CM | POA: Diagnosis not present

## 2015-06-15 DIAGNOSIS — M7732 Calcaneal spur, left foot: Secondary | ICD-10-CM | POA: Diagnosis not present

## 2015-06-27 DIAGNOSIS — M722 Plantar fascial fibromatosis: Secondary | ICD-10-CM | POA: Diagnosis not present

## 2015-06-27 DIAGNOSIS — M7732 Calcaneal spur, left foot: Secondary | ICD-10-CM | POA: Diagnosis not present

## 2015-06-28 ENCOUNTER — Encounter: Payer: Self-pay | Admitting: Ophthalmology

## 2015-07-18 DIAGNOSIS — I480 Paroxysmal atrial fibrillation: Secondary | ICD-10-CM | POA: Diagnosis not present

## 2015-07-25 DIAGNOSIS — M722 Plantar fascial fibromatosis: Secondary | ICD-10-CM | POA: Diagnosis not present

## 2015-07-25 DIAGNOSIS — M7732 Calcaneal spur, left foot: Secondary | ICD-10-CM | POA: Diagnosis not present

## 2015-08-16 DIAGNOSIS — I480 Paroxysmal atrial fibrillation: Secondary | ICD-10-CM | POA: Diagnosis not present

## 2015-09-13 DIAGNOSIS — I480 Paroxysmal atrial fibrillation: Secondary | ICD-10-CM | POA: Diagnosis not present

## 2015-10-20 DIAGNOSIS — I480 Paroxysmal atrial fibrillation: Secondary | ICD-10-CM | POA: Diagnosis not present

## 2015-10-31 DIAGNOSIS — I480 Paroxysmal atrial fibrillation: Secondary | ICD-10-CM | POA: Diagnosis not present

## 2015-10-31 DIAGNOSIS — N3941 Urge incontinence: Secondary | ICD-10-CM | POA: Diagnosis not present

## 2015-10-31 DIAGNOSIS — M79672 Pain in left foot: Secondary | ICD-10-CM | POA: Diagnosis not present

## 2015-10-31 DIAGNOSIS — E039 Hypothyroidism, unspecified: Secondary | ICD-10-CM | POA: Diagnosis not present

## 2015-10-31 DIAGNOSIS — Z23 Encounter for immunization: Secondary | ICD-10-CM | POA: Diagnosis not present

## 2015-10-31 DIAGNOSIS — G4733 Obstructive sleep apnea (adult) (pediatric): Secondary | ICD-10-CM | POA: Diagnosis not present

## 2015-10-31 DIAGNOSIS — Z8673 Personal history of transient ischemic attack (TIA), and cerebral infarction without residual deficits: Secondary | ICD-10-CM | POA: Diagnosis not present

## 2015-10-31 DIAGNOSIS — Z6841 Body Mass Index (BMI) 40.0 and over, adult: Secondary | ICD-10-CM | POA: Diagnosis not present

## 2015-11-06 DIAGNOSIS — M545 Low back pain: Secondary | ICD-10-CM | POA: Diagnosis not present

## 2015-11-06 DIAGNOSIS — I480 Paroxysmal atrial fibrillation: Secondary | ICD-10-CM | POA: Diagnosis not present

## 2015-11-06 DIAGNOSIS — Z6841 Body Mass Index (BMI) 40.0 and over, adult: Secondary | ICD-10-CM | POA: Diagnosis not present

## 2015-11-06 DIAGNOSIS — G8929 Other chronic pain: Secondary | ICD-10-CM | POA: Diagnosis not present

## 2015-11-06 DIAGNOSIS — E039 Hypothyroidism, unspecified: Secondary | ICD-10-CM | POA: Diagnosis not present

## 2015-11-06 DIAGNOSIS — Z23 Encounter for immunization: Secondary | ICD-10-CM | POA: Diagnosis not present

## 2015-11-06 DIAGNOSIS — G4733 Obstructive sleep apnea (adult) (pediatric): Secondary | ICD-10-CM | POA: Diagnosis not present

## 2015-11-06 DIAGNOSIS — Z0000 Encounter for general adult medical examination without abnormal findings: Secondary | ICD-10-CM | POA: Diagnosis not present

## 2015-11-30 DIAGNOSIS — I480 Paroxysmal atrial fibrillation: Secondary | ICD-10-CM | POA: Diagnosis not present

## 2016-01-04 DIAGNOSIS — I480 Paroxysmal atrial fibrillation: Secondary | ICD-10-CM | POA: Diagnosis not present

## 2016-01-08 DIAGNOSIS — H04123 Dry eye syndrome of bilateral lacrimal glands: Secondary | ICD-10-CM | POA: Diagnosis not present

## 2016-01-31 DIAGNOSIS — I480 Paroxysmal atrial fibrillation: Secondary | ICD-10-CM | POA: Diagnosis not present

## 2016-02-08 DIAGNOSIS — G4733 Obstructive sleep apnea (adult) (pediatric): Secondary | ICD-10-CM | POA: Diagnosis not present

## 2016-02-20 DIAGNOSIS — R350 Frequency of micturition: Secondary | ICD-10-CM | POA: Diagnosis not present

## 2016-02-20 DIAGNOSIS — I4891 Unspecified atrial fibrillation: Secondary | ICD-10-CM | POA: Diagnosis not present

## 2016-02-20 DIAGNOSIS — G8929 Other chronic pain: Secondary | ICD-10-CM | POA: Diagnosis not present

## 2016-02-20 DIAGNOSIS — M545 Low back pain: Secondary | ICD-10-CM | POA: Diagnosis not present

## 2016-02-24 ENCOUNTER — Encounter: Payer: Self-pay | Admitting: Emergency Medicine

## 2016-02-24 ENCOUNTER — Inpatient Hospital Stay
Admission: EM | Admit: 2016-02-24 | Discharge: 2016-02-25 | DRG: 103 | Disposition: A | Discharge status: Home / Self Care | Payer: Medicare HMO | Attending: Internal Medicine | Admitting: Internal Medicine

## 2016-02-24 ENCOUNTER — Emergency Department: Payer: Medicare HMO

## 2016-02-24 DIAGNOSIS — Z9989 Dependence on other enabling machines and devices: Secondary | ICD-10-CM | POA: Diagnosis not present

## 2016-02-24 DIAGNOSIS — Z880 Allergy status to penicillin: Secondary | ICD-10-CM

## 2016-02-24 DIAGNOSIS — Z91041 Radiographic dye allergy status: Secondary | ICD-10-CM | POA: Diagnosis not present

## 2016-02-24 DIAGNOSIS — R519 Headache, unspecified: Secondary | ICD-10-CM

## 2016-02-24 DIAGNOSIS — Z79891 Long term (current) use of opiate analgesic: Secondary | ICD-10-CM | POA: Diagnosis not present

## 2016-02-24 DIAGNOSIS — I480 Paroxysmal atrial fibrillation: Secondary | ICD-10-CM | POA: Diagnosis not present

## 2016-02-24 DIAGNOSIS — Z7902 Long term (current) use of antithrombotics/antiplatelets: Secondary | ICD-10-CM

## 2016-02-24 DIAGNOSIS — R531 Weakness: Secondary | ICD-10-CM | POA: Diagnosis not present

## 2016-02-24 DIAGNOSIS — Z8673 Personal history of transient ischemic attack (TIA), and cerebral infarction without residual deficits: Secondary | ICD-10-CM | POA: Diagnosis not present

## 2016-02-24 DIAGNOSIS — R4781 Slurred speech: Secondary | ICD-10-CM | POA: Diagnosis present

## 2016-02-24 DIAGNOSIS — Z7901 Long term (current) use of anticoagulants: Secondary | ICD-10-CM

## 2016-02-24 DIAGNOSIS — N390 Urinary tract infection, site not specified: Secondary | ICD-10-CM | POA: Diagnosis present

## 2016-02-24 DIAGNOSIS — E039 Hypothyroidism, unspecified: Secondary | ICD-10-CM | POA: Diagnosis present

## 2016-02-24 DIAGNOSIS — Z79899 Other long term (current) drug therapy: Secondary | ICD-10-CM

## 2016-02-24 DIAGNOSIS — N3000 Acute cystitis without hematuria: Secondary | ICD-10-CM | POA: Diagnosis not present

## 2016-02-24 DIAGNOSIS — R110 Nausea: Secondary | ICD-10-CM | POA: Diagnosis present

## 2016-02-24 DIAGNOSIS — R32 Unspecified urinary incontinence: Secondary | ICD-10-CM | POA: Diagnosis not present

## 2016-02-24 DIAGNOSIS — I6523 Occlusion and stenosis of bilateral carotid arteries: Secondary | ICD-10-CM | POA: Diagnosis not present

## 2016-02-24 DIAGNOSIS — I482 Chronic atrial fibrillation: Secondary | ICD-10-CM | POA: Diagnosis not present

## 2016-02-24 DIAGNOSIS — R262 Difficulty in walking, not elsewhere classified: Secondary | ICD-10-CM

## 2016-02-24 DIAGNOSIS — G459 Transient cerebral ischemic attack, unspecified: Secondary | ICD-10-CM | POA: Diagnosis not present

## 2016-02-24 DIAGNOSIS — Z8249 Family history of ischemic heart disease and other diseases of the circulatory system: Secondary | ICD-10-CM

## 2016-02-24 DIAGNOSIS — R5381 Other malaise: Secondary | ICD-10-CM

## 2016-02-24 DIAGNOSIS — R51 Headache: Secondary | ICD-10-CM | POA: Diagnosis not present

## 2016-02-24 DIAGNOSIS — G451 Carotid artery syndrome (hemispheric): Secondary | ICD-10-CM | POA: Diagnosis not present

## 2016-02-24 DIAGNOSIS — G4730 Sleep apnea, unspecified: Secondary | ICD-10-CM | POA: Diagnosis not present

## 2016-02-24 DIAGNOSIS — Z881 Allergy status to other antibiotic agents status: Secondary | ICD-10-CM

## 2016-02-24 DIAGNOSIS — M6281 Muscle weakness (generalized): Secondary | ICD-10-CM

## 2016-02-24 LAB — CBC
HEMATOCRIT: 42.4 % (ref 35.0–47.0)
Hemoglobin: 14.5 g/dL (ref 12.0–16.0)
MCH: 31.6 pg (ref 26.0–34.0)
MCHC: 34.3 g/dL (ref 32.0–36.0)
MCV: 92.2 fL (ref 80.0–100.0)
Platelets: 221 10*3/uL (ref 150–440)
RBC: 4.6 MIL/uL (ref 3.80–5.20)
RDW: 13.6 % (ref 11.5–14.5)
WBC: 6.6 10*3/uL (ref 3.6–11.0)

## 2016-02-24 LAB — COMPREHENSIVE METABOLIC PANEL
ALT: 13 U/L — ABNORMAL LOW (ref 14–54)
ANION GAP: 6 (ref 5–15)
AST: 24 U/L (ref 15–41)
Albumin: 4.1 g/dL (ref 3.5–5.0)
Alkaline Phosphatase: 73 U/L (ref 38–126)
BILIRUBIN TOTAL: 1.1 mg/dL (ref 0.3–1.2)
BUN: 16 mg/dL (ref 6–20)
CO2: 29 mmol/L (ref 22–32)
Calcium: 8.9 mg/dL (ref 8.9–10.3)
Chloride: 103 mmol/L (ref 101–111)
Creatinine, Ser: 0.87 mg/dL (ref 0.44–1.00)
GFR calc Af Amer: >60 mL/min (ref >60)
Glucose, Bld: 133 mg/dL — ABNORMAL HIGH (ref 65–99)
POTASSIUM: 4.9 mmol/L (ref 3.5–5.1)
Sodium: 138 mmol/L (ref 135–145)
TOTAL PROTEIN: 8 g/dL (ref 6.5–8.1)

## 2016-02-24 LAB — PROTIME-INR
INR: 1.82
Prothrombin Time: 21.3 seconds — ABNORMAL HIGH (ref 11.4–15.2)

## 2016-02-24 LAB — TROPONIN I

## 2016-02-24 LAB — LIPASE, BLOOD: LIPASE: 17 U/L (ref 11–51)

## 2016-02-24 MED ORDER — ONDANSETRON 4 MG PO TBDP
4.0000 mg | ORAL_TABLET | Freq: Once | ORAL | Status: AC | PRN
Start: 2016-02-24 — End: 2016-02-24
  Administered 2016-02-24: 4 mg via ORAL

## 2016-02-24 MED ORDER — ONDANSETRON 4 MG PO TBDP
ORAL_TABLET | ORAL | Status: AC
  Filled 2016-02-24: qty 1

## 2016-02-24 MED ORDER — ONDANSETRON HCL 4 MG/2ML IJ SOLN
4.0000 mg | Freq: Once | INTRAMUSCULAR | Status: AC
  Administered 2016-02-24: 4 mg via INTRAVENOUS
  Filled 2016-02-24: qty 2

## 2016-02-24 MED ORDER — FENTANYL CITRATE (PF) 100 MCG/2ML IJ SOLN
50.0000 ug | Freq: Once | INTRAMUSCULAR | Status: AC
  Administered 2016-02-24: 50 ug via INTRAVENOUS
  Filled 2016-02-24: qty 2

## 2016-02-24 MED ORDER — SODIUM CHLORIDE 0.9 % IV BOLUS (SEPSIS)
500.0000 mL | Freq: Once | INTRAVENOUS | Status: AC
  Administered 2016-02-24: 500 mL via INTRAVENOUS

## 2016-02-24 NOTE — ED Notes (Addendum)
Pt's duaghter and son are now present and state they think pt is having a stroke because she has had a headache since noon. Daughter states her mother has been confused today and has had a headache since noon. Daughter states her father told her her mother's speech was slurred today. Pt appears unchanged at this time, cms intact in all extremities, speech clear.

## 2016-02-24 NOTE — ED Triage Notes (Addendum)
Pt states nausea that began at lunchtime. Pt states she did have generalized abd pain, but it is gone. Pt states "i just don't feel good." pt states she has also had a headache today. Pt denies diarrhea, but states she feels like "it could happen". resps unlabored. Pt drinking pepsi when this rn called her back for triage without difficulty.

## 2016-02-24 NOTE — ED Notes (Signed)
Pt to CT via wheelchair
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
## 2016-02-24 NOTE — ED Notes (Signed)
ED Provider at bedside. 

## 2016-02-25 ENCOUNTER — Inpatient Hospital Stay: Payer: Medicare HMO

## 2016-02-25 ENCOUNTER — Inpatient Hospital Stay
Admit: 2016-02-25 | Discharge: 2016-02-25 | Disposition: A | Discharge status: Home / Self Care | Payer: Medicare HMO | Attending: Internal Medicine | Admitting: Internal Medicine

## 2016-02-25 ENCOUNTER — Encounter: Payer: Self-pay | Admitting: Internal Medicine

## 2016-02-25 DIAGNOSIS — Z9989 Dependence on other enabling machines and devices: Secondary | ICD-10-CM | POA: Diagnosis not present

## 2016-02-25 DIAGNOSIS — G451 Carotid artery syndrome (hemispheric): Secondary | ICD-10-CM

## 2016-02-25 DIAGNOSIS — I480 Paroxysmal atrial fibrillation: Secondary | ICD-10-CM | POA: Diagnosis not present

## 2016-02-25 DIAGNOSIS — G459 Transient cerebral ischemic attack, unspecified: Secondary | ICD-10-CM | POA: Diagnosis present

## 2016-02-25 DIAGNOSIS — Z7902 Long term (current) use of antithrombotics/antiplatelets: Secondary | ICD-10-CM | POA: Diagnosis not present

## 2016-02-25 DIAGNOSIS — N390 Urinary tract infection, site not specified: Secondary | ICD-10-CM | POA: Diagnosis present

## 2016-02-25 DIAGNOSIS — R110 Nausea: Secondary | ICD-10-CM | POA: Diagnosis present

## 2016-02-25 DIAGNOSIS — R51 Headache: Secondary | ICD-10-CM | POA: Diagnosis not present

## 2016-02-25 DIAGNOSIS — R32 Unspecified urinary incontinence: Secondary | ICD-10-CM | POA: Diagnosis present

## 2016-02-25 DIAGNOSIS — I482 Chronic atrial fibrillation: Secondary | ICD-10-CM | POA: Diagnosis present

## 2016-02-25 DIAGNOSIS — Z8673 Personal history of transient ischemic attack (TIA), and cerebral infarction without residual deficits: Secondary | ICD-10-CM | POA: Diagnosis not present

## 2016-02-25 DIAGNOSIS — Z8249 Family history of ischemic heart disease and other diseases of the circulatory system: Secondary | ICD-10-CM | POA: Diagnosis not present

## 2016-02-25 DIAGNOSIS — Z880 Allergy status to penicillin: Secondary | ICD-10-CM | POA: Diagnosis not present

## 2016-02-25 DIAGNOSIS — Z91041 Radiographic dye allergy status: Secondary | ICD-10-CM | POA: Diagnosis not present

## 2016-02-25 DIAGNOSIS — N3000 Acute cystitis without hematuria: Secondary | ICD-10-CM | POA: Diagnosis present

## 2016-02-25 DIAGNOSIS — I6523 Occlusion and stenosis of bilateral carotid arteries: Secondary | ICD-10-CM | POA: Diagnosis not present

## 2016-02-25 DIAGNOSIS — Z881 Allergy status to other antibiotic agents status: Secondary | ICD-10-CM | POA: Diagnosis not present

## 2016-02-25 DIAGNOSIS — Z79891 Long term (current) use of opiate analgesic: Secondary | ICD-10-CM | POA: Diagnosis not present

## 2016-02-25 DIAGNOSIS — G4730 Sleep apnea, unspecified: Secondary | ICD-10-CM | POA: Diagnosis present

## 2016-02-25 DIAGNOSIS — Z79899 Other long term (current) drug therapy: Secondary | ICD-10-CM | POA: Diagnosis not present

## 2016-02-25 DIAGNOSIS — R4781 Slurred speech: Secondary | ICD-10-CM | POA: Diagnosis not present

## 2016-02-25 DIAGNOSIS — E039 Hypothyroidism, unspecified: Secondary | ICD-10-CM | POA: Diagnosis present

## 2016-02-25 DIAGNOSIS — Z7901 Long term (current) use of anticoagulants: Secondary | ICD-10-CM | POA: Diagnosis not present

## 2016-02-25 LAB — URINALYSIS, COMPLETE (UACMP) WITH MICROSCOPIC
BILIRUBIN URINE: NEGATIVE
Glucose, UA: NEGATIVE mg/dL
HGB URINE DIPSTICK: NEGATIVE
Ketones, ur: NEGATIVE mg/dL
NITRITE: NEGATIVE
PH: 7 (ref 5.0–8.0)
Protein, ur: NEGATIVE mg/dL
SPECIFIC GRAVITY, URINE: 1.017 (ref 1.005–1.030)

## 2016-02-25 LAB — LIPID PANEL
Cholesterol: 128 mg/dL (ref 0–200)
HDL: 47 mg/dL (ref >40)
LDL CALC: 64 mg/dL (ref 0–99)
Total CHOL/HDL Ratio: 2.7 RATIO
Triglycerides: 87 mg/dL (ref <150)
VLDL: 17 mg/dL (ref 0–40)

## 2016-02-25 MED ORDER — METOCLOPRAMIDE HCL 5 MG/ML IJ SOLN
2.5000 mg | Freq: Once | INTRAMUSCULAR | Status: AC
  Administered 2016-02-25: 2.5 mg via INTRAVENOUS

## 2016-02-25 MED ORDER — CHLORHEXIDINE GLUCONATE 0.12 % MT SOLN
15.0000 mL | Freq: Two times a day (BID) | OROMUCOSAL | Status: DC
  Administered 2016-02-25: 15 mL via OROMUCOSAL
  Filled 2016-02-25: qty 15

## 2016-02-25 MED ORDER — ASPIRIN 300 MG RE SUPP: 300.0000 mg | Freq: Every day | RECTAL | Status: DC

## 2016-02-25 MED ORDER — METOCLOPRAMIDE HCL 5 MG/ML IJ SOLN
INTRAMUSCULAR | Status: AC
  Administered 2016-02-25: 2.5 mg via INTRAVENOUS
  Filled 2016-02-25: qty 2

## 2016-02-25 MED ORDER — ACETAMINOPHEN 160 MG/5ML PO SOLN: 650.0000 mg | ORAL | Status: DC | PRN

## 2016-02-25 MED ORDER — CLOPIDOGREL BISULFATE 75 MG PO TABS
75.0000 mg | ORAL_TABLET | Freq: Every day | ORAL | Status: DC
  Administered 2016-02-25: 75 mg via ORAL
  Filled 2016-02-25: qty 1

## 2016-02-25 MED ORDER — STROKE: EARLY STAGES OF RECOVERY BOOK
Freq: Once | Status: AC
  Administered 2016-02-25: 06:00:00

## 2016-02-25 MED ORDER — ACETAMINOPHEN 325 MG PO TABS: 650.0000 mg | ORAL_TABLET | ORAL | Status: DC | PRN

## 2016-02-25 MED ORDER — WARFARIN SODIUM 4 MG PO TABS: 4.0000 mg | ORAL_TABLET | ORAL | Status: DC

## 2016-02-25 MED ORDER — SODIUM CHLORIDE 0.9 % IV SOLN
INTRAVENOUS | Status: DC
  Administered 2016-02-25: 06:00:00 via INTRAVENOUS

## 2016-02-25 MED ORDER — CEPHALEXIN 500 MG PO CAPS: 500.0000 mg | ORAL_CAPSULE | Freq: Three times a day (TID) | ORAL | 0 refills | Status: DC

## 2016-02-25 MED ORDER — NITROGLYCERIN 0.4 MG SL SUBL: 0.4000 mg | SUBLINGUAL_TABLET | SUBLINGUAL | Status: DC | PRN

## 2016-02-25 MED ORDER — WARFARIN SODIUM 4 MG PO TABS: 5.0000 mg | ORAL_TABLET | ORAL | Status: DC

## 2016-02-25 MED ORDER — CEFTRIAXONE SODIUM 1 G IJ SOLR: 1.0000 g | INTRAMUSCULAR | Status: DC

## 2016-02-25 MED ORDER — LORAZEPAM 2 MG/ML IJ SOLN
0.5000 mg | Freq: Once | INTRAMUSCULAR | Status: AC
  Administered 2016-02-25: 0.5 mg via INTRAVENOUS
  Filled 2016-02-25: qty 1

## 2016-02-25 MED ORDER — OXYBUTYNIN CHLORIDE 5 MG PO TABS
5.0000 mg | ORAL_TABLET | Freq: Three times a day (TID) | ORAL | Status: DC
  Administered 2016-02-25: 5 mg via ORAL
  Filled 2016-02-25: qty 1

## 2016-02-25 MED ORDER — WARFARIN - PHYSICIAN DOSING INPATIENT: Freq: Every day | Status: DC

## 2016-02-25 MED ORDER — SENNOSIDES-DOCUSATE SODIUM 8.6-50 MG PO TABS: 1.0000 | ORAL_TABLET | Freq: Every evening | ORAL | Status: DC | PRN

## 2016-02-25 MED ORDER — ASPIRIN 325 MG PO TABS
325.0000 mg | ORAL_TABLET | Freq: Every day | ORAL | Status: DC
  Administered 2016-02-25: 325 mg via ORAL
  Filled 2016-02-25: qty 1

## 2016-02-25 MED ORDER — CEFTRIAXONE SODIUM-DEXTROSE 1-3.74 GM-% IV SOLR
1.0000 g | INTRAVENOUS | Status: DC
  Administered 2016-02-25: 1 g via INTRAVENOUS
  Filled 2016-02-25: qty 50

## 2016-02-25 MED ORDER — KETOROLAC TROMETHAMINE 15 MG/ML IJ SOLN
15.0000 mg | Freq: Once | INTRAMUSCULAR | Status: AC
  Administered 2016-02-25: 14:00:00 15 mg via INTRAVENOUS
  Filled 2016-02-25: qty 1

## 2016-02-25 MED ORDER — ACETAMINOPHEN 650 MG RE SUPP: 650.0000 mg | RECTAL | Status: DC | PRN

## 2016-02-25 MED ORDER — HYDROCODONE-ACETAMINOPHEN 5-325 MG PO TABS: 1.0000 | ORAL_TABLET | Freq: Two times a day (BID) | ORAL | Status: DC | PRN

## 2016-02-25 MED ORDER — DILTIAZEM HCL ER COATED BEADS 120 MG PO CP24
120.0000 mg | ORAL_CAPSULE | Freq: Every day | ORAL | Status: DC
Start: 2016-02-25 — End: 2016-02-25
  Administered 2016-02-25: 08:00:00 120 mg via ORAL
  Filled 2016-02-25: qty 1

## 2016-02-25 MED ORDER — LEVOTHYROXINE SODIUM 50 MCG PO TABS
50.0000 ug | ORAL_TABLET | Freq: Every day | ORAL | Status: DC
  Administered 2016-02-25: 08:00:00 50 ug via ORAL
  Filled 2016-02-25: qty 1

## 2016-02-25 MED ORDER — CEFTRIAXONE SODIUM-DEXTROSE 1-3.74 GM-% IV SOLR: 1.0000 g | INTRAVENOUS | Status: DC

## 2016-02-25 MED ORDER — MAGNESIUM SULFATE 2 GM/50ML IV SOLN
2.0000 g | Freq: Once | INTRAVENOUS | Status: AC
  Administered 2016-02-25: 14:00:00 2 g via INTRAVENOUS
  Filled 2016-02-25: qty 50

## 2016-02-25 MED ORDER — ATENOLOL 50 MG PO TABS: 25.0000 mg | ORAL_TABLET | Freq: Every day | ORAL | Status: DC

## 2016-02-25 NOTE — Progress Notes (Signed)
Pharmacy Antibiotic Note  Nicole Walton is a 83 y.o. female admitted on 02/24/2016 with UTI.  Pharmacy has been consulted for ceftriaxone dosing.  Plan: Ceftriaxone 1 gm IV Q24H  Height: 5\' 4"  (162.6 cm) Weight: 212 lb (96.2 kg) IBW/kg (Calculated) : 54.7  Temp (24hrs), Avg:98.3 F (36.8 C), Min:98.3 F (36.8 C), Max:98.3 F (36.8 C)   Recent Labs Lab 02/24/16 2057  WBC 6.6  CREATININE 0.87    Estimated Creatinine Clearance: 56.1 mL/min (by C-G formula based on SCr of 0.87 mg/dL).    Allergies  Allergen Reactions  . Ciprofloxacin Other (See Comments)    Not sure of reaction   . Contrast Media [Iodinated Diagnostic Agents] Swelling    Of feet and legs.  (Betadine is OK to use)  . Nitrofurantoin Other (See Comments)    GI distress  . Penicillin G Other (See Comments)    Pt doesn't know.     Thank you for allowing pharmacy to be a part of this patient's care.  Laural Benes, Pharm.D., BCPS Clinical Pharmacist 02/25/2016 2:57 AM

## 2016-02-25 NOTE — Discharge Summary (Signed)
[place] [facility] [place] NAME: Nicole Walton    MR#:  [id number]  DATE OF BIRTH:  September 24, 1931  DATE OF ADMISSION:  02/24/2016 ADMITTING PHYSICIAN: Saundra Shelling, MD  DATE OF DISCHARGE: 02/25/2016  PRIMARY CARE PHYSICIAN: Tracie Harrier, MD    ADMISSION DIAGNOSIS:  Nausea [R11.0] Malaise [R53.81] Weakness [R53.1] Urinary tract infection without hematuria, site unspecified [N39.0] Acute nonintractable headache, unspecified headache type [R51]  DISCHARGE DIAGNOSIS:  Headache Acute cystitis without hematuria  SECONDARY DIAGNOSIS:   Past Medical History:  Diagnosis Date  . Dysrhythmia    A-fib  . Edema    feet/legs  . Hypothyroidism   . Incontinence   . Shortness of breath dyspnea    with exersion  . Sleep apnea   . Stroke ([place])   . TIA (transient ischemic attack)     HOSPITAL COURSE:   1. Slurred speech, headache. Slurred speech has resolved. Headache still present will give a dose of Toradol and magnesium IV. MRI of the brain is negative this is not a stroke. Carotid ultrasound and echocardiogram done but not read at the time of discharge. Patient seen in consultation by neurology and cleared to go home. 2. Acute cystitis without hematuria. Started on Rocephin in the emergency room prescribed Keflex for a few more days at home. 3. History of old right frontal stroke in the past. On Coumadin and Plavix 4. History of sleep apnea on CPAP 5. Hypothyroidism unspecified on levothyroxine 6. History of paroxysmal atrial fibrillation on Cardizem and Coumadin  DISCHARGE CONDITIONS:   Follow-up PMD one week  CONSULTS OBTAINED:  Treatment Team:  Leotis Pain, MD  DRUG ALLERGIES:   Allergies  Allergen Reactions  . Ciprofloxacin Other (See Comments)    Not sure of reaction   . Contrast Media [Iodinated Diagnostic Agents] Swelling    Of feet and legs.  (Betadine is OK to use)  . Nitrofurantoin Other (See Comments)    GI  distress  . Penicillin G Other (See Comments)    Pt doesn't know.     DISCHARGE MEDICATIONS:   Current Discharge Medication List    START taking these medications   Details  cephALEXin (KEFLEX) 500 MG capsule Take 1 capsule (500 mg total) by mouth 3 (three) times daily. Qty: 6 capsule, Refills: 0      CONTINUE these medications which have NOT CHANGED   Details  clopidogrel (PLAVIX) 75 MG tablet Take 75 mg by mouth daily.    diltiazem (DILACOR XR) 120 MG 24 hr capsule Take 120 mg by mouth daily.    HYDROcodone-acetaminophen (NORCO/VICODIN) 5-325 MG tablet Take 1 tablet by mouth 2 (two) times daily as needed for moderate pain. Reported on 06/01/2015    levothyroxine (SYNTHROID, LEVOTHROID) 50 MCG tablet Take 50 mcg by mouth daily before breakfast.    nitroGLYCERIN (NITROSTAT) 0.4 MG SL tablet Place 0.4 mg under the tongue every 5 (five) minutes as needed for chest pain.    oxybutynin (DITROPAN) 5 MG tablet Take 5 mg by mouth 3 (three) times daily.    !! warfarin (COUMADIN) 4 MG tablet Take 4 mg by mouth every morning. Monday - Friday. Not on Saturdays and sundays.    !! warfarin (COUMADIN) 5 MG tablet Take 5 mg by mouth 2 (two) times a week. Saturday and Sunday.     !! - Potential duplicate medications found. Please discuss with provider.    STOP taking these medications     atenolol (TENORMIN) 25 MG tablet  doxycycline (VIBRA-TABS) 100 MG tablet          DISCHARGE INSTRUCTIONS:   Follow-up with PMD one week  If you experience worsening of your admission symptoms, develop shortness of breath, life threatening emergency, suicidal or homicidal thoughts you must seek medical attention immediately by calling 911 or calling your MD immediately  if symptoms less severe.  You Must read complete instructions/literature along with all the possible adverse reactions/side effects for all the Medicines you take and that have been prescribed to you. Take any new Medicines after  you have completely understood and accept all the possible adverse reactions/side effects.   Please note  You were cared for by a hospitalist during your hospital stay. If you have any questions about your discharge medications or the care you received while you were in the hospital after you are discharged, you can call the unit and asked to speak with the hospitalist on call if the hospitalist that took care of you is not available. Once you are discharged, your primary care physician will handle any further medical issues. Please note that NO REFILLS for any discharge medications will be authorized once you are discharged, as it is imperative that you return to your primary care physician (or establish a relationship with a primary care physician if you do not have one) for your aftercare needs so that they can reassess your need for medications and monitor your lab values.    Today   CHIEF COMPLAINT:   Chief Complaint  Patient presents with  . Nausea    HISTORY OF PRESENT ILLNESS:  Nicole Walton  is a 83 y.o. female with a known history of Came in with slurred speech headache and some nausea   VITAL SIGNS:  Blood pressure (!) 143/60, pulse 81, temperature 98 F (36.7 C), temperature source Oral, resp. rate 18, height 5\' 4"  (1.626 m), weight 96.2 kg (212 lb), SpO2 99 %.    PHYSICAL EXAMINATION:  GENERAL:  83 y.o.-year-old patient lying in the bed with no acute distress.  EYES: Pupils equal, round, reactive to light and accommodation. No scleral icterus. Extraocular muscles intact.  HEENT: Head atraumatic, normocephalic. Oropharynx and nasopharynx clear.  NECK:  Supple, no jugular venous distention. No thyroid enlargement, no tenderness.  LUNGS: Normal breath sounds bilaterally, no wheezing, rales,rhonchi or crepitation. No use of accessory muscles of respiration.  CARDIOVASCULAR: S1, S2 normal. No murmurs, rubs, or gallops.  ABDOMEN: Soft, non-tender, non-distended. Bowel  sounds present. No organomegaly or mass.  EXTREMITIES: Trace edema, no cyanosis, or clubbing.  NEUROLOGIC: Cranial nerves II through XII are intact. Muscle strength 5/5 in all extremities. Sensation intact. Gait not checked.  PSYCHIATRIC: The patient is alert and oriented x 3.  SKIN: No obvious rash, lesion, or ulcer.   DATA REVIEW:   CBC  Recent Labs Lab 02/24/16 2057  WBC 6.6  HGB 14.5  HCT 42.4  PLT 221    Chemistries   Recent Labs Lab 02/24/16 2057  NA 138  K 4.9  CL 103  CO2 29  GLUCOSE 133*  BUN 16  CREATININE 0.87  CALCIUM 8.9  AST 24  ALT 13*  ALKPHOS 73  BILITOT 1.1    Cardiac Enzymes  Recent Labs Lab 02/24/16 2057  TROPONINI <0.03      RADIOLOGY:  Ct Head Wo Contrast  Result Date: 02/24/2016 CLINICAL DATA:  Headache and nausea EXAM: CT HEAD WITHOUT CONTRAST TECHNIQUE: Contiguous axial images were obtained from the base of the skull through  the vertex without intravenous contrast. COMPARISON:  09/02/2013, 08/17/2010 FINDINGS: Brain: No acute territorial infarction, intracranial hemorrhage or extra-axial fluid collection is seen. There is no focal mass, mass effect or midline shift. Old left cerebellar infarct. Old right frontal lobe infarct. Mild periventricular and deep white matter hypodensity consistent with small vessel changes. Moderate atrophy. Ventricles are nonenlarged. Vascular: No hyperdense vessels. Scattered carotid artery calcifications. Skull: Normal. Negative for fracture or focal lesion. Sinuses/Orbits: No acute finding. Other: None IMPRESSION: No CT evidence for acute intracranial hemorrhage. Old left cerebellar and right frontal lobe infarcts. Electronically Signed   By: Donavan Foil M.D.   On: 02/24/2016 23:09   Mr Brain Wo Contrast  Result Date: 02/25/2016 CLINICAL DATA:  Acute presentation with nausea and slurred speech. Headache. EXAM: MRI HEAD WITHOUT CONTRAST MRA HEAD WITHOUT CONTRAST TECHNIQUE: Multiplanar, multiecho pulse  sequences of the brain and surrounding structures were obtained without intravenous contrast. Angiographic images of the head were obtained using MRA technique without contrast. COMPARISON:  Head CT 02/24/2016.  MRI 09/02/2013. FINDINGS: MRI HEAD FINDINGS Brain: Diffusion imaging does not show any acute or subacute infarction. There is an old inferior cerebellar infarction on the left. Mild chronic small-vessel changes affect the pons. Cerebral hemispheres show an old right posterior frontal cortical and subcortical infarction. Minimal small vessel changes of the white matter. No mass lesion, hemorrhage, hydrocephalus or extra-axial collection. Vascular: Major vessels at the base of the brain show flow. Skull and upper cervical spine: Negative Sinuses/Orbits: Clear/normal Other: None significant MRA HEAD FINDINGS Both internal carotid arteries are widely patent into the brain. The anterior and middle cerebral arteries are patent without proximal stenosis, aneurysm or vascular malformation. Both vertebral arteries are patent to the basilar. No basilar stenosis. Posterior circulation branch vessels are normal. This includes flow in left PICA. IMPRESSION: No acute finding by MRI. Old inferior left cerebellar infarction. Old right posterior frontal infarction. Intracranial MR angiography of the large and medium size vessels is within normal limits. Electronically Signed   By: Nelson Chimes M.D.   On: 02/25/2016 11:18   Mr Jodene Nam Head/brain [id number] Cm  Result Date: 02/25/2016 CLINICAL DATA:  Acute presentation with nausea and slurred speech. Headache. EXAM: MRI HEAD WITHOUT CONTRAST MRA HEAD WITHOUT CONTRAST TECHNIQUE: Multiplanar, multiecho pulse sequences of the brain and surrounding structures were obtained without intravenous contrast. Angiographic images of the head were obtained using MRA technique without contrast. COMPARISON:  Head CT 02/24/2016.  MRI 09/02/2013. FINDINGS: MRI HEAD FINDINGS Brain: Diffusion imaging  does not show any acute or subacute infarction. There is an old inferior cerebellar infarction on the left. Mild chronic small-vessel changes affect the pons. Cerebral hemispheres show an old right posterior frontal cortical and subcortical infarction. Minimal small vessel changes of the white matter. No mass lesion, hemorrhage, hydrocephalus or extra-axial collection. Vascular: Major vessels at the base of the brain show flow. Skull and upper cervical spine: Negative Sinuses/Orbits: Clear/normal Other: None significant MRA HEAD FINDINGS Both internal carotid arteries are widely patent into the brain. The anterior and middle cerebral arteries are patent without proximal stenosis, aneurysm or vascular malformation. Both vertebral arteries are patent to the basilar. No basilar stenosis. Posterior circulation branch vessels are normal. This includes flow in left PICA. IMPRESSION: No acute finding by MRI. Old inferior left cerebellar infarction. Old right posterior frontal infarction. Intracranial MR angiography of the large and medium size vessels is within normal limits. Electronically Signed   By: Nelson Chimes M.D.   On: 02/25/2016 11:18  Management plans discussed with the patient, family and they are in agreement.  CODE STATUS:     Code Status Orders        Start     Ordered   02/25/16 0534  Full code  Continuous     02/25/16 0533    Code Status History    Date Active Date Inactive Code Status Order ID Comments User Context   This patient has a current code status but no historical code status.    Advance Directive Documentation   Flowsheet Row Most Recent Value  Type of Advance Directive  Healthcare Power of Attorney, Living will  Pre-existing out of facility DNR order (yellow form or pink MOST form)  No data  "MOST" Form in Place?  No data      TOTAL TIME TAKING CARE OF THIS PATIENT: 35 minutes.    Loletha Grayer M.D on 02/25/2016 at 1:58 PM  Between 7am to 6pm - Pager -  [phone]  After 6pm go to [email or web address] - password [email or web address]  [employer] Office  [phone]  CC: Primary care physician; Tracie Harrier, MD
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
## 2016-02-25 NOTE — Consult Note (Signed)
Reason for Consult:dysarthria  Referring Physician: Dr. Leslye Peer   CC: dysarthria   HPI: Nicole Walton is an 83 y.o. female  with a known history of Atrial fibrillation on Coumadin, hypothyroidism, sleep apnea, CVA, urinary incontinence presented to the emergency room with nausea and slurred speech. Patient had slurred speech for about 20 minutes. Currently back to baseline. Was found to have UTI and currently on antibiotics.    Past Medical History:  Diagnosis Date  . Dysrhythmia    A-fib  . Edema    feet/legs  . Hypothyroidism   . Incontinence   . Shortness of breath dyspnea    with exersion  . Sleep apnea   . Stroke ([place])   . TIA (transient ischemic attack)     Past Surgical History:  Procedure Laterality Date  . CATARACT EXTRACTION W/PHACO Right 04/20/2015   Procedure: CATARACT EXTRACTION PHACO AND INTRAOCULAR LENS PLACEMENT (IOC);  Surgeon: Leandrew Koyanagi, MD;  Location: [facility] ORS;  Service: Ophthalmology;  Laterality: Right;  [place]   1:12.1 AP%  16.2 CDE   11.69 fluid cassette lot# [id number]   exp.07/04/2016  . CATARACT EXTRACTION W/PHACO Left 06/01/2015   Procedure: CATARACT EXTRACTION PHACO AND INTRAOCULAR LENS PLACEMENT (IOC);  Surgeon: Leandrew Koyanagi, MD;  Location: [facility] ORS;  Service: Ophthalmology;  Laterality: Left;  [place] 1.17 AP% 9.9 CDE 765 Fluid Pack Lot # [id number]  . CESAREAN SECTION    . CHOLECYSTECTOMY    . COLONOSCOPY    . FOOT SURGERY      Family History  Problem Relation Age of Onset  . Atrial fibrillation Son   . Diabetes Daughter     Social History:  reports that she has never smoked. She has never used smokeless tobacco. She reports that she does not drink alcohol or use drugs.  Allergies  Allergen Reactions  . Ciprofloxacin Other (See Comments)    Not sure of reaction   . Contrast Media [Iodinated Diagnostic Agents] Swelling    Of feet and legs.  (Betadine is OK to use)  . Nitrofurantoin Other (See Comments)    GI distress  .  Penicillin G Other (See Comments)    Pt doesn't know.     Medications: I have reviewed the patient's current medications.  ROS: History obtained from the patient  General ROS: negative for - chills, fatigue, fever, night sweats, weight gain or weight loss Psychological ROS: negative for - behavioral disorder, hallucinations, memory difficulties, mood swings or suicidal ideation Ophthalmic ROS: negative for - blurry vision, double vision, eye pain or loss of vision ENT ROS: negative for - epistaxis, nasal discharge, oral lesions, sore throat, tinnitus or vertigo Allergy and Immunology ROS: negative for - hives or itchy/watery eyes Hematological and Lymphatic ROS: negative for - bleeding problems, bruising or swollen lymph nodes Endocrine ROS: negative for - galactorrhea, hair pattern changes, polydipsia/polyuria or temperature intolerance Respiratory ROS: negative for - cough, hemoptysis, shortness of breath or wheezing Cardiovascular ROS: negative for - chest pain, dyspnea on exertion, edema or irregular heartbeat Gastrointestinal ROS: negative for - abdominal pain, diarrhea, hematemesis, nausea/vomiting or stool incontinence Genito-Urinary ROS: negative for - dysuria, hematuria, incontinence or urinary frequency/urgency Musculoskeletal ROS: negative for - joint swelling or muscular weakness Neurological ROS: as noted in HPI Dermatological ROS: negative for rash and skin lesion changes  Physical Examination: Blood pressure (!) 143/60, pulse 81, temperature 98 F (36.7 C), temperature source Oral, resp. rate 18, height 5\' 4"  (1.626 m), weight 96.2 kg (212 lb), SpO2 99 %.  Neurological Examination Mental Status: Alert, oriented, thought content appropriate.  Speech fluent without evidence of aphasia.  Able to follow 3 step commands without difficulty. Cranial Nerves: II: Discs flat bilaterally; Visual fields grossly normal, pupils equal, round, reactive to light and  accommodation III,IV, VI: ptosis not present, extra-ocular motions intact bilaterally V,VII: smile symmetric, facial light touch sensation normal bilaterally VIII: hearing normal bilaterally IX,X: gag reflex present XI: bilateral shoulder shrug XII: midline tongue extension Motor: Right : Upper extremity   5/5    Left:     Upper extremity   5/5  Lower extremity   5/5     Lower extremity   5/5 Tone and bulk:normal tone throughout; no atrophy noted Sensory: Pinprick and light touch intact throughout, bilaterally Deep Tendon Reflexes: 2+ and symmetric throughout Plantars: Right: downgoing   Left: downgoing Cerebellar: normal finger-to-nose, normal rapid alternating movements and normal heel-to-shin test Gait: normal gait and station      Laboratory Studies:   Basic Metabolic Panel:  Recent Labs Lab 02/24/16 2057  NA 138  K 4.9  CL 103  CO2 29  GLUCOSE 133*  BUN 16  CREATININE 0.87  CALCIUM 8.9    Liver Function Tests:  Recent Labs Lab 02/24/16 2057  AST 24  ALT 13*  ALKPHOS 73  BILITOT 1.1  PROT 8.0  ALBUMIN 4.1    Recent Labs Lab 02/24/16 2057  LIPASE 17   No results for input(s): AMMONIA in the last 168 hours.  CBC:  Recent Labs Lab 02/24/16 2057  WBC 6.6  HGB 14.5  HCT 42.4  MCV 92.2  PLT 221    Cardiac Enzymes:  Recent Labs Lab 02/24/16 2057  TROPONINI <0.03    BNP: Invalid input(s): POCBNP  CBG: No results for input(s): GLUCAP in the last 168 hours.  Microbiology: No results found for this or any previous visit.  Coagulation Studies:  Recent Labs  02/24/16 2057  LABPROT 21.3*  INR 1.82    Urinalysis:  Recent Labs Lab 02/25/16 0054  COLORURINE YELLOW*  LABSPEC 1.017  PHURINE 7.0  GLUCOSEU NEGATIVE  HGBUR NEGATIVE  BILIRUBINUR NEGATIVE  KETONESUR NEGATIVE  PROTEINUR NEGATIVE  NITRITE NEGATIVE  LEUKOCYTESUR TRACE*    Lipid Panel:     Component Value Date/Time   CHOL 128 02/25/2016 0559   TRIG 87  02/25/2016 0559   HDL 47 02/25/2016 0559   CHOLHDL 2.7 02/25/2016 0559   VLDL 17 02/25/2016 0559   LDLCALC 64 02/25/2016 0559    HgbA1C: No results found for: HGBA1C  Urine Drug Screen:  No results found for: LABOPIA, COCAINSCRNUR, LABBENZ, AMPHETMU, THCU, LABBARB  Alcohol Level: No results for input(s): ETH in the last 168 hours.  Other results: EKG: Aifb .  Imaging: Ct Head Wo Contrast  Result Date: 02/24/2016 CLINICAL DATA:  Headache and nausea EXAM: CT HEAD WITHOUT CONTRAST TECHNIQUE: Contiguous axial images were obtained from the base of the skull through the vertex without intravenous contrast. COMPARISON:  09/02/2013, 08/17/2010 FINDINGS: Brain: No acute territorial infarction, intracranial hemorrhage or extra-axial fluid collection is seen. There is no focal mass, mass effect or midline shift. Old left cerebellar infarct. Old right frontal lobe infarct. Mild periventricular and deep white matter hypodensity consistent with small vessel changes. Moderate atrophy. Ventricles are nonenlarged. Vascular: No hyperdense vessels. Scattered carotid artery calcifications. Skull: Normal. Negative for fracture or focal lesion. Sinuses/Orbits: No acute finding. Other: None IMPRESSION: No CT evidence for acute intracranial hemorrhage. Old left cerebellar and right frontal lobe infarcts.  Electronically Signed   By: Donavan Foil M.D.   On: 02/24/2016 23:09   Mr Brain Wo Contrast  Result Date: 02/25/2016 CLINICAL DATA:  Acute presentation with nausea and slurred speech. Headache. EXAM: MRI HEAD WITHOUT CONTRAST MRA HEAD WITHOUT CONTRAST TECHNIQUE: Multiplanar, multiecho pulse sequences of the brain and surrounding structures were obtained without intravenous contrast. Angiographic images of the head were obtained using MRA technique without contrast. COMPARISON:  Head CT 02/24/2016.  MRI 09/02/2013. FINDINGS: MRI HEAD FINDINGS Brain: Diffusion imaging does not show any acute or subacute infarction.  There is an old inferior cerebellar infarction on the left. Mild chronic small-vessel changes affect the pons. Cerebral hemispheres show an old right posterior frontal cortical and subcortical infarction. Minimal small vessel changes of the white matter. No mass lesion, hemorrhage, hydrocephalus or extra-axial collection. Vascular: Major vessels at the base of the brain show flow. Skull and upper cervical spine: Negative Sinuses/Orbits: Clear/normal Other: None significant MRA HEAD FINDINGS Both internal carotid arteries are widely patent into the brain. The anterior and middle cerebral arteries are patent without proximal stenosis, aneurysm or vascular malformation. Both vertebral arteries are patent to the basilar. No basilar stenosis. Posterior circulation branch vessels are normal. This includes flow in left PICA. IMPRESSION: No acute finding by MRI. Old inferior left cerebellar infarction. Old right posterior frontal infarction. Intracranial MR angiography of the large and medium size vessels is within normal limits. Electronically Signed   By: Nelson Chimes M.D.   On: 02/25/2016 11:18   Mr Jodene Nam Head/brain [id number] Cm  Result Date: 02/25/2016 CLINICAL DATA:  Acute presentation with nausea and slurred speech. Headache. EXAM: MRI HEAD WITHOUT CONTRAST MRA HEAD WITHOUT CONTRAST TECHNIQUE: Multiplanar, multiecho pulse sequences of the brain and surrounding structures were obtained without intravenous contrast. Angiographic images of the head were obtained using MRA technique without contrast. COMPARISON:  Head CT 02/24/2016.  MRI 09/02/2013. FINDINGS: MRI HEAD FINDINGS Brain: Diffusion imaging does not show any acute or subacute infarction. There is an old inferior cerebellar infarction on the left. Mild chronic small-vessel changes affect the pons. Cerebral hemispheres show an old right posterior frontal cortical and subcortical infarction. Minimal small vessel changes of the white matter. No mass lesion, hemorrhage,  hydrocephalus or extra-axial collection. Vascular: Major vessels at the base of the brain show flow. Skull and upper cervical spine: Negative Sinuses/Orbits: Clear/normal Other: None significant MRA HEAD FINDINGS Both internal carotid arteries are widely patent into the brain. The anterior and middle cerebral arteries are patent without proximal stenosis, aneurysm or vascular malformation. Both vertebral arteries are patent to the basilar. No basilar stenosis. Posterior circulation branch vessels are normal. This includes flow in left PICA. IMPRESSION: No acute finding by MRI. Old inferior left cerebellar infarction. Old right posterior frontal infarction. Intracranial MR angiography of the large and medium size vessels is within normal limits. Electronically Signed   By: Nelson Chimes M.D.   On: 02/25/2016 11:18     Assessment/Plan:  83 y.o. female  with a known history of Atrial fibrillation on Coumadin, hypothyroidism, sleep apnea, CVA, urinary incontinence presented to the emergency room with nausea and slurred speech. Patient had slurred speech for about 20 minutes. Currently back to baseline. Was found to have UTI and currently on antibiotics.    - MRI and MRA reviewed no acute abnormalities.  - Con't anticoagulation  - P/ot and d/c planning - s/p discussion with family - UTI treatment per primary team  02/25/2016, 12:53 PM

## 2016-02-25 NOTE — ED Notes (Signed)
Patient requested to be put back in wheelchair, she wants to sit up with her legs over the side of the bed and for safety she compromised for the wheelchair.

## 2016-02-25 NOTE — Progress Notes (Signed)
Pt has been discharged home. Discharge papers given and explained to pt and spouse. Both verbalized understanding. F/U appointment and meds reviewed with pt. RX given. Escorted in a wheelchair.
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
## 2016-02-25 NOTE — ED Notes (Signed)
Pt transported to room 124 
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
## 2016-02-26 LAB — ECHOCARDIOGRAM COMPLETE
HEIGHTINCHES: 64 in
WEIGHTICAEL: 3392 [oz_av]

## 2016-02-28 DIAGNOSIS — G4733 Obstructive sleep apnea (adult) (pediatric): Secondary | ICD-10-CM | POA: Diagnosis not present

## 2016-02-28 DIAGNOSIS — Z6841 Body Mass Index (BMI) 40.0 and over, adult: Secondary | ICD-10-CM | POA: Diagnosis not present

## 2016-02-28 DIAGNOSIS — B9620 Unspecified Escherichia coli [E. coli] as the cause of diseases classified elsewhere: Secondary | ICD-10-CM | POA: Diagnosis not present

## 2016-02-28 DIAGNOSIS — E039 Hypothyroidism, unspecified: Secondary | ICD-10-CM | POA: Diagnosis not present

## 2016-02-28 DIAGNOSIS — N390 Urinary tract infection, site not specified: Secondary | ICD-10-CM | POA: Diagnosis not present

## 2016-02-28 DIAGNOSIS — Z09 Encounter for follow-up examination after completed treatment for conditions other than malignant neoplasm: Secondary | ICD-10-CM | POA: Diagnosis not present

## 2016-02-28 DIAGNOSIS — I4891 Unspecified atrial fibrillation: Secondary | ICD-10-CM | POA: Diagnosis not present

## 2016-02-28 DIAGNOSIS — Z8673 Personal history of transient ischemic attack (TIA), and cerebral infarction without residual deficits: Secondary | ICD-10-CM | POA: Diagnosis not present

## 2016-03-08 DIAGNOSIS — J069 Acute upper respiratory infection, unspecified: Secondary | ICD-10-CM | POA: Diagnosis not present

## 2016-03-08 DIAGNOSIS — E039 Hypothyroidism, unspecified: Secondary | ICD-10-CM | POA: Diagnosis not present

## 2016-03-08 DIAGNOSIS — G4733 Obstructive sleep apnea (adult) (pediatric): Secondary | ICD-10-CM | POA: Diagnosis not present

## 2016-03-08 DIAGNOSIS — Z6841 Body Mass Index (BMI) 40.0 and over, adult: Secondary | ICD-10-CM | POA: Diagnosis not present

## 2016-03-08 DIAGNOSIS — I4891 Unspecified atrial fibrillation: Secondary | ICD-10-CM | POA: Diagnosis not present

## 2016-03-19 DIAGNOSIS — R8290 Unspecified abnormal findings in urine: Secondary | ICD-10-CM | POA: Diagnosis not present

## 2016-03-19 DIAGNOSIS — Z87440 Personal history of urinary (tract) infections: Secondary | ICD-10-CM | POA: Diagnosis not present

## 2016-04-18 DIAGNOSIS — I480 Paroxysmal atrial fibrillation: Secondary | ICD-10-CM | POA: Diagnosis not present

## 2016-05-20 DIAGNOSIS — I480 Paroxysmal atrial fibrillation: Secondary | ICD-10-CM | POA: Diagnosis not present

## 2016-06-17 DIAGNOSIS — Z7901 Long term (current) use of anticoagulants: Secondary | ICD-10-CM | POA: Diagnosis not present

## 2016-06-21 DIAGNOSIS — R0602 Shortness of breath: Secondary | ICD-10-CM | POA: Diagnosis not present

## 2016-06-21 DIAGNOSIS — G4733 Obstructive sleep apnea (adult) (pediatric): Secondary | ICD-10-CM | POA: Diagnosis not present

## 2016-06-28 DIAGNOSIS — I480 Paroxysmal atrial fibrillation: Secondary | ICD-10-CM | POA: Diagnosis not present

## 2016-06-28 DIAGNOSIS — R21 Rash and other nonspecific skin eruption: Secondary | ICD-10-CM | POA: Diagnosis not present

## 2016-06-28 DIAGNOSIS — E039 Hypothyroidism, unspecified: Secondary | ICD-10-CM | POA: Diagnosis not present

## 2016-07-03 DIAGNOSIS — E039 Hypothyroidism, unspecified: Secondary | ICD-10-CM | POA: Diagnosis not present

## 2016-07-03 DIAGNOSIS — G4733 Obstructive sleep apnea (adult) (pediatric): Secondary | ICD-10-CM | POA: Diagnosis not present

## 2016-07-03 DIAGNOSIS — R8290 Unspecified abnormal findings in urine: Secondary | ICD-10-CM | POA: Diagnosis not present

## 2016-07-03 DIAGNOSIS — J069 Acute upper respiratory infection, unspecified: Secondary | ICD-10-CM | POA: Diagnosis not present

## 2016-07-03 DIAGNOSIS — I4891 Unspecified atrial fibrillation: Secondary | ICD-10-CM | POA: Diagnosis not present

## 2016-07-03 DIAGNOSIS — Z6841 Body Mass Index (BMI) 40.0 and over, adult: Secondary | ICD-10-CM | POA: Diagnosis not present

## 2016-07-10 DIAGNOSIS — R21 Rash and other nonspecific skin eruption: Secondary | ICD-10-CM | POA: Diagnosis not present

## 2016-07-10 DIAGNOSIS — G4733 Obstructive sleep apnea (adult) (pediatric): Secondary | ICD-10-CM | POA: Diagnosis not present

## 2016-07-10 DIAGNOSIS — I480 Paroxysmal atrial fibrillation: Secondary | ICD-10-CM | POA: Diagnosis not present

## 2016-07-10 DIAGNOSIS — N3941 Urge incontinence: Secondary | ICD-10-CM | POA: Diagnosis not present

## 2016-07-10 DIAGNOSIS — E039 Hypothyroidism, unspecified: Secondary | ICD-10-CM | POA: Diagnosis not present

## 2016-07-10 DIAGNOSIS — Z6841 Body Mass Index (BMI) 40.0 and over, adult: Secondary | ICD-10-CM | POA: Diagnosis not present

## 2016-07-11 ENCOUNTER — Emergency Department
Admission: EM | Admit: 2016-07-11 | Discharge: 2016-07-12 | Disposition: A | Discharge status: Home / Self Care | Payer: Medicare HMO | Attending: Emergency Medicine | Admitting: Emergency Medicine

## 2016-07-11 ENCOUNTER — Encounter: Payer: Self-pay | Admitting: Emergency Medicine

## 2016-07-11 DIAGNOSIS — Z8673 Personal history of transient ischemic attack (TIA), and cerebral infarction without residual deficits: Secondary | ICD-10-CM | POA: Diagnosis not present

## 2016-07-11 DIAGNOSIS — I4891 Unspecified atrial fibrillation: Secondary | ICD-10-CM | POA: Insufficient documentation

## 2016-07-11 DIAGNOSIS — R531 Weakness: Secondary | ICD-10-CM

## 2016-07-11 DIAGNOSIS — R39198 Other difficulties with micturition: Secondary | ICD-10-CM | POA: Insufficient documentation

## 2016-07-11 DIAGNOSIS — E039 Hypothyroidism, unspecified: Secondary | ICD-10-CM | POA: Diagnosis not present

## 2016-07-11 LAB — URINALYSIS, COMPLETE (UACMP) WITH MICROSCOPIC
Bilirubin Urine: NEGATIVE
GLUCOSE, UA: NEGATIVE mg/dL
HGB URINE DIPSTICK: NEGATIVE
KETONES UR: NEGATIVE mg/dL
Nitrite: NEGATIVE
PH: 5 (ref 5.0–8.0)
PROTEIN: NEGATIVE mg/dL
Specific Gravity, Urine: 1.01 (ref 1.005–1.030)

## 2016-07-11 LAB — CBC
HCT: 42.4 % (ref 35.0–47.0)
Hemoglobin: 14.2 g/dL (ref 12.0–16.0)
MCH: 32.3 pg (ref 26.0–34.0)
MCHC: 33.6 g/dL (ref 32.0–36.0)
MCV: 96.1 fL (ref 80.0–100.0)
PLATELETS: 169 10*3/uL (ref 150–440)
RBC: 4.41 MIL/uL (ref 3.80–5.20)
RDW: 14.2 % (ref 11.5–14.5)
WBC: 6.4 10*3/uL (ref 3.6–11.0)

## 2016-07-11 LAB — BASIC METABOLIC PANEL
ANION GAP: 8 (ref 5–15)
BUN: 13 mg/dL (ref 6–20)
CALCIUM: 8.5 mg/dL — AB (ref 8.9–10.3)
CO2: 22 mmol/L (ref 22–32)
Chloride: 102 mmol/L (ref 101–111)
Creatinine, Ser: 1.17 mg/dL — ABNORMAL HIGH (ref 0.44–1.00)
GFR calc Af Amer: 49 mL/min — ABNORMAL LOW (ref >60)
GFR, EST NON AFRICAN AMERICAN: 42 mL/min — AB (ref >60)
Glucose, Bld: 111 mg/dL — ABNORMAL HIGH (ref 65–99)
POTASSIUM: 4.2 mmol/L (ref 3.5–5.1)
SODIUM: 132 mmol/L — AB (ref 135–145)

## 2016-07-11 LAB — PROTIME-INR
INR: 2.84
Prothrombin Time: 30.4 seconds — ABNORMAL HIGH (ref 11.4–15.2)

## 2016-07-11 LAB — TROPONIN I

## 2016-07-11 NOTE — ED Notes (Signed)
Patient reports she was diagnosed with Ecoli and placed on medication. Pt reports that she took some pain medication a little while after taking her Ecoli medication so she could work outside. Pt reports she felt she was having an "afib flare" when she was outside due to weakness.

## 2016-07-11 NOTE — ED Triage Notes (Addendum)
Patient ambulatory to triage with steady gait, without difficulty or distress noted; pt reports feeling weakness today; st that she takes oxybutin 3x/day and normally urinates frequently and hasn't voided in 3 hours--pt appears very anxious over such; pt reports that she thinks she is in afib but denies any CP or irregularity; pt st that she is taking bactrim for e coli and took a hydrocodone with it as well today

## 2016-07-11 NOTE — ED Provider Notes (Addendum)
[facility] Emergency Department Provider Note       Time seen: ----------------------------------------- 10:12 PM on 07/11/2016 -----------------------------------------     I have reviewed the triage vital signs and the nursing notes.   HISTORY   Chief Complaint Weakness    HPI Nicole Walton is a 83 y.o. female who presents to the ED for weakness today. Patient takes oxybutynin 3 times a day normally urinates frequently and hasn't voided in 3 hours. Patient had presented very anxious over same. Patient thinks she is in A. fib but denies any chest pain. Recently she has been treated for a UTI. She is taking Bactrim for Escherichia coli in her urine and took a hydrocodone with it as well today. Currently she is feeling much improved from what she was feeling earlier. She has not been in atrial fibrillation in over a year.   Past Medical History:  Diagnosis Date  . Dysrhythmia    A-fib  . Edema    feet/legs  . Hypothyroidism   . Incontinence   . Shortness of breath dyspnea    with exersion  . Sleep apnea   . Stroke ([place])   . TIA (transient ischemic attack)     Patient Active Problem List   Diagnosis Date Noted  . TIA (transient ischemic attack) 02/25/2016  . UTI (urinary tract infection) 02/25/2016    Past Surgical History:  Procedure Laterality Date  . CATARACT EXTRACTION W/PHACO Right 04/20/2015   Procedure: CATARACT EXTRACTION PHACO AND INTRAOCULAR LENS PLACEMENT (IOC);  Surgeon: Leandrew Koyanagi, MD;  Location: [facility] ORS;  Service: Ophthalmology;  Laterality: Right;  [place]   1:12.1 AP%  16.2 CDE   11.69 fluid cassette lot# [id number]   exp.07/04/2016  . CATARACT EXTRACTION W/PHACO Left 06/01/2015   Procedure: CATARACT EXTRACTION PHACO AND INTRAOCULAR LENS PLACEMENT (IOC);  Surgeon: Leandrew Koyanagi, MD;  Location: [facility] ORS;  Service: Ophthalmology;  Laterality: Left;  [place] 1.17 AP% 9.9 CDE 765 Fluid Pack Lot # [id number]  . CESAREAN  SECTION    . CHOLECYSTECTOMY    . COLONOSCOPY    . FOOT SURGERY      Allergies Ciprofloxacin; Contrast media [iodinated diagnostic agents]; Nitrofurantoin; and Penicillin g  Social History Social History  Substance Use Topics  . Smoking status: Never Smoker  . Smokeless tobacco: Never Used  . Alcohol use No    Review of Systems Constitutional: Negative for fever. Eyes: Negative for vision changes ENT:  Negative for congestion, sore throat Cardiovascular: Negative for chest pain. Respiratory: Negative for shortness of breath. Gastrointestinal: Negative for abdominal pain, vomiting and diarrhea. Genitourinary: Positive for decreased urination Musculoskeletal: Negative for back pain. Skin: Negative for rash. Neurological: Negative for headaches, positive for generalized weakness  All systems negative/normal/unremarkable except as stated in the HPI  ____________________________________________   PHYSICAL EXAM:  VITAL SIGNS: ED Triage Vitals  Enc Vitals Group     BP 07/11/16 1930 (!) 120/100     Pulse Rate 07/11/16 1930 68     Resp 07/11/16 1930 18     Temp 07/11/16 1930 98 F (36.7 C)     Temp Source 07/11/16 1930 Oral     SpO2 07/11/16 1930 97 %     Weight 07/11/16 1928 208 lb (94.3 kg)     Height 07/11/16 1928 5' (1.524 m)     Head Circumference --      Peak Flow --      Pain Score --      Pain Loc --  Pain Edu? --      Excl. in [place]? --     Constitutional: Alert and oriented. Anxious, no distress Eyes: Conjunctivae are normal. Normal extraocular movements. ENT   Head: Normocephalic and atraumatic.   Nose: No congestion/rhinnorhea.   Mouth/Throat: Mucous membranes are moist.   Neck: No stridor. Cardiovascular: Irregularly irregular rhythm. No murmurs, rubs, or gallops. Respiratory: Normal respiratory effort without tachypnea nor retractions. Breath sounds are clear and equal bilaterally. No wheezes/rales/rhonchi. Gastrointestinal: Soft and  nontender. Normal bowel sounds Musculoskeletal: Nontender with normal range of motion in extremities. No lower extremity tenderness nor edema. Neurologic:  Normal speech and language. No gross focal neurologic deficits are appreciated.  Skin:  Skin is warm, dry and intact. No rash noted. Psychiatric: Mood and affect are normal. Speech and behavior are normal.  ____________________________________________  EKG: Interpreted by me. Atrial fibrillation with a rate of 63 bpm, normal QT, possible septal infarct age indeterminate.  ____________________________________________  ED COURSE:  Pertinent labs & imaging results that were available during my care of the patient were reviewed by me and considered in my medical decision making (see chart for details). Patient presents for weakness and urinary of occult E with possible anxiety, we will assess with labs as indicated.   Procedures ____________________________________________   LABS (pertinent positives/negatives)  Labs Reviewed  BASIC METABOLIC PANEL - Abnormal; Notable for the following:       Result Value   Sodium 132 (*)    Glucose, Bld 111 (*)    Creatinine, Ser 1.17 (*)    Calcium 8.5 (*)    GFR calc non Af Amer 42 (*)    GFR calc Af Amer 49 (*)    All other components within normal limits  URINALYSIS, COMPLETE (UACMP) WITH MICROSCOPIC - Abnormal; Notable for the following:    Color, Urine YELLOW (*)    APPearance CLEAR (*)    Leukocytes, UA TRACE (*)    Bacteria, UA RARE (*)    Squamous Epithelial / LPF 0-5 (*)    All other components within normal limits  PROTIME-INR - Abnormal; Notable for the following:    Prothrombin Time 30.4 (*)    All other components within normal limits  TROPONIN I  CBC   ____________________________________________  FINAL ASSESSMENT AND PLAN  Atrial fibrillation, weakness  Plan: Patient's labs and imaging were dictated above. Patient had presented for Weakness But workup here was  negative. Urine has cleared and no longer indicates infection. She remains in A. fib but this is chronic for her and she is anticoagulated adequately.   Earleen Newport, MD   Note: This note was generated in part or whole with voice recognition software. Voice recognition is usually quite accurate but there are transcription errors that can and very often do occur. I apologize for any typographical errors that were not detected and corrected.     Earleen Newport, MD 07/11/16 2308    Earleen Newport, MD 07/11/16 [phone]

## 2016-07-17 DIAGNOSIS — R0602 Shortness of breath: Secondary | ICD-10-CM | POA: Diagnosis not present

## 2016-07-19 DIAGNOSIS — Z7901 Long term (current) use of anticoagulants: Secondary | ICD-10-CM | POA: Diagnosis not present

## 2016-07-29 DIAGNOSIS — I361 Nonrheumatic tricuspid (valve) insufficiency: Secondary | ICD-10-CM | POA: Diagnosis not present

## 2016-07-29 DIAGNOSIS — G4733 Obstructive sleep apnea (adult) (pediatric): Secondary | ICD-10-CM | POA: Diagnosis not present

## 2016-07-29 DIAGNOSIS — I480 Paroxysmal atrial fibrillation: Secondary | ICD-10-CM | POA: Diagnosis not present

## 2016-07-29 DIAGNOSIS — R05 Cough: Secondary | ICD-10-CM | POA: Diagnosis not present

## 2016-07-29 DIAGNOSIS — J40 Bronchitis, not specified as acute or chronic: Secondary | ICD-10-CM | POA: Diagnosis not present

## 2016-08-01 DIAGNOSIS — R791 Abnormal coagulation profile: Secondary | ICD-10-CM | POA: Diagnosis not present

## 2016-08-05 DIAGNOSIS — R0609 Other forms of dyspnea: Secondary | ICD-10-CM | POA: Diagnosis not present

## 2016-08-05 DIAGNOSIS — R05 Cough: Secondary | ICD-10-CM | POA: Diagnosis not present

## 2016-08-05 DIAGNOSIS — I071 Rheumatic tricuspid insufficiency: Secondary | ICD-10-CM | POA: Diagnosis not present

## 2016-08-05 DIAGNOSIS — I272 Pulmonary hypertension, unspecified: Secondary | ICD-10-CM | POA: Diagnosis not present

## 2016-08-12 DIAGNOSIS — R002 Palpitations: Secondary | ICD-10-CM | POA: Diagnosis not present

## 2016-08-12 DIAGNOSIS — I10 Essential (primary) hypertension: Secondary | ICD-10-CM | POA: Diagnosis not present

## 2016-08-12 DIAGNOSIS — I071 Rheumatic tricuspid insufficiency: Secondary | ICD-10-CM | POA: Diagnosis not present

## 2016-08-12 DIAGNOSIS — R05 Cough: Secondary | ICD-10-CM | POA: Diagnosis not present

## 2016-08-12 DIAGNOSIS — R0609 Other forms of dyspnea: Secondary | ICD-10-CM | POA: Diagnosis not present

## 2016-09-04 DIAGNOSIS — L2389 Allergic contact dermatitis due to other agents: Secondary | ICD-10-CM | POA: Diagnosis not present

## 2016-09-05 DIAGNOSIS — Z7901 Long term (current) use of anticoagulants: Secondary | ICD-10-CM | POA: Diagnosis not present

## 2016-10-08 DIAGNOSIS — Z7901 Long term (current) use of anticoagulants: Secondary | ICD-10-CM | POA: Diagnosis not present

## 2016-10-31 DIAGNOSIS — E039 Hypothyroidism, unspecified: Secondary | ICD-10-CM | POA: Diagnosis not present

## 2016-10-31 DIAGNOSIS — R21 Rash and other nonspecific skin eruption: Secondary | ICD-10-CM | POA: Diagnosis not present

## 2016-10-31 DIAGNOSIS — G4733 Obstructive sleep apnea (adult) (pediatric): Secondary | ICD-10-CM | POA: Diagnosis not present

## 2016-10-31 DIAGNOSIS — I480 Paroxysmal atrial fibrillation: Secondary | ICD-10-CM | POA: Diagnosis not present

## 2016-10-31 DIAGNOSIS — Z6841 Body Mass Index (BMI) 40.0 and over, adult: Secondary | ICD-10-CM | POA: Diagnosis not present

## 2016-10-31 DIAGNOSIS — N3941 Urge incontinence: Secondary | ICD-10-CM | POA: Diagnosis not present

## 2016-11-07 DIAGNOSIS — L298 Other pruritus: Secondary | ICD-10-CM | POA: Diagnosis not present

## 2016-11-07 DIAGNOSIS — I4891 Unspecified atrial fibrillation: Secondary | ICD-10-CM | POA: Diagnosis not present

## 2016-11-07 DIAGNOSIS — Z1231 Encounter for screening mammogram for malignant neoplasm of breast: Secondary | ICD-10-CM | POA: Diagnosis not present

## 2016-11-07 DIAGNOSIS — Z23 Encounter for immunization: Secondary | ICD-10-CM | POA: Diagnosis not present

## 2016-11-07 DIAGNOSIS — Z0000 Encounter for general adult medical examination without abnormal findings: Secondary | ICD-10-CM | POA: Diagnosis not present

## 2016-11-07 DIAGNOSIS — E039 Hypothyroidism, unspecified: Secondary | ICD-10-CM | POA: Diagnosis not present

## 2016-11-07 DIAGNOSIS — Z6841 Body Mass Index (BMI) 40.0 and over, adult: Secondary | ICD-10-CM | POA: Diagnosis not present

## 2016-11-07 DIAGNOSIS — Z7901 Long term (current) use of anticoagulants: Secondary | ICD-10-CM | POA: Diagnosis not present

## 2016-11-11 DIAGNOSIS — R0609 Other forms of dyspnea: Secondary | ICD-10-CM | POA: Diagnosis not present

## 2016-11-11 DIAGNOSIS — R002 Palpitations: Secondary | ICD-10-CM | POA: Diagnosis not present

## 2016-11-11 DIAGNOSIS — I361 Nonrheumatic tricuspid (valve) insufficiency: Secondary | ICD-10-CM | POA: Diagnosis not present

## 2016-11-11 DIAGNOSIS — R05 Cough: Secondary | ICD-10-CM | POA: Diagnosis not present

## 2016-11-11 DIAGNOSIS — I071 Rheumatic tricuspid insufficiency: Secondary | ICD-10-CM | POA: Diagnosis not present

## 2016-11-11 DIAGNOSIS — I480 Paroxysmal atrial fibrillation: Secondary | ICD-10-CM | POA: Diagnosis not present

## 2016-11-11 DIAGNOSIS — J40 Bronchitis, not specified as acute or chronic: Secondary | ICD-10-CM | POA: Diagnosis not present

## 2016-11-11 DIAGNOSIS — I10 Essential (primary) hypertension: Secondary | ICD-10-CM | POA: Diagnosis not present

## 2016-11-25 ENCOUNTER — Other Ambulatory Visit: Payer: Self-pay | Admitting: Internal Medicine

## 2016-11-25 DIAGNOSIS — Z1231 Encounter for screening mammogram for malignant neoplasm of breast: Secondary | ICD-10-CM

## 2016-12-05 DIAGNOSIS — Z7901 Long term (current) use of anticoagulants: Secondary | ICD-10-CM | POA: Diagnosis not present

## 2016-12-06 ENCOUNTER — Emergency Department
Admission: EM | Admit: 2016-12-06 | Discharge: 2016-12-06 | Disposition: A | Discharge status: Home / Self Care | Payer: Medicare HMO | Attending: Emergency Medicine | Admitting: Emergency Medicine

## 2016-12-06 ENCOUNTER — Emergency Department: Payer: Medicare HMO

## 2016-12-06 DIAGNOSIS — Z8673 Personal history of transient ischemic attack (TIA), and cerebral infarction without residual deficits: Secondary | ICD-10-CM | POA: Diagnosis not present

## 2016-12-06 DIAGNOSIS — G459 Transient cerebral ischemic attack, unspecified: Secondary | ICD-10-CM | POA: Diagnosis not present

## 2016-12-06 DIAGNOSIS — E039 Hypothyroidism, unspecified: Secondary | ICD-10-CM | POA: Diagnosis not present

## 2016-12-06 DIAGNOSIS — I4891 Unspecified atrial fibrillation: Secondary | ICD-10-CM | POA: Insufficient documentation

## 2016-12-06 DIAGNOSIS — R4182 Altered mental status, unspecified: Secondary | ICD-10-CM | POA: Diagnosis present

## 2016-12-06 DIAGNOSIS — Z7901 Long term (current) use of anticoagulants: Secondary | ICD-10-CM | POA: Diagnosis not present

## 2016-12-06 DIAGNOSIS — Z79899 Other long term (current) drug therapy: Secondary | ICD-10-CM | POA: Diagnosis not present

## 2016-12-06 DIAGNOSIS — R4701 Aphasia: Secondary | ICD-10-CM | POA: Diagnosis not present

## 2016-12-06 DIAGNOSIS — B353 Tinea pedis: Secondary | ICD-10-CM | POA: Diagnosis not present

## 2016-12-06 LAB — COMPREHENSIVE METABOLIC PANEL
ALBUMIN: 3.5 g/dL (ref 3.5–5.0)
ALK PHOS: 60 U/L (ref 38–126)
ALT: 10 U/L — ABNORMAL LOW (ref 14–54)
ANION GAP: 8 (ref 5–15)
AST: 22 U/L (ref 15–41)
BILIRUBIN TOTAL: 1.7 mg/dL — AB (ref 0.3–1.2)
BUN: 9 mg/dL (ref 6–20)
CALCIUM: 8.5 mg/dL — AB (ref 8.9–10.3)
CO2: 28 mmol/L (ref 22–32)
Chloride: 104 mmol/L (ref 101–111)
Creatinine, Ser: 0.79 mg/dL (ref 0.44–1.00)
GFR calc non Af Amer: >60 mL/min (ref >60)
GLUCOSE: 99 mg/dL (ref 65–99)
POTASSIUM: 3.3 mmol/L — AB (ref 3.5–5.1)
SODIUM: 140 mmol/L (ref 135–145)
TOTAL PROTEIN: 6.6 g/dL (ref 6.5–8.1)

## 2016-12-06 LAB — DIFFERENTIAL
Basophils Absolute: 0 10*3/uL (ref 0–0.1)
Basophils Relative: 0 %
EOS ABS: 0.1 10*3/uL (ref 0–0.7)
EOS PCT: 2 %
LYMPHS ABS: 1 10*3/uL (ref 1.0–3.6)
Lymphocytes Relative: 16 %
MONO ABS: 0.6 10*3/uL (ref 0.2–0.9)
Monocytes Relative: 9 %
NEUTROS PCT: 73 %
Neutro Abs: 4.6 10*3/uL (ref 1.4–6.5)

## 2016-12-06 LAB — CBC
HCT: 37.9 % (ref 35.0–47.0)
Hemoglobin: 12.7 g/dL (ref 12.0–16.0)
MCH: 32 pg (ref 26.0–34.0)
MCHC: 33.6 g/dL (ref 32.0–36.0)
MCV: 95.2 fL (ref 80.0–100.0)
PLATELETS: 198 10*3/uL (ref 150–440)
RBC: 3.98 MIL/uL (ref 3.80–5.20)
RDW: 13.8 % (ref 11.5–14.5)
WBC: 6.4 10*3/uL (ref 3.6–11.0)

## 2016-12-06 LAB — URINALYSIS, ROUTINE W REFLEX MICROSCOPIC
BILIRUBIN URINE: NEGATIVE
Bacteria, UA: NONE SEEN
GLUCOSE, UA: NEGATIVE mg/dL
HGB URINE DIPSTICK: NEGATIVE
KETONES UR: NEGATIVE mg/dL
NITRITE: NEGATIVE
PH: 6 (ref 5.0–8.0)
PROTEIN: NEGATIVE mg/dL
Specific Gravity, Urine: 1.004 — ABNORMAL LOW (ref 1.005–1.030)

## 2016-12-06 LAB — URINE DRUG SCREEN, QUALITATIVE (ARMC ONLY)
AMPHETAMINES, UR SCREEN: NOT DETECTED
BARBITURATES, UR SCREEN: NOT DETECTED
BENZODIAZEPINE, UR SCRN: NOT DETECTED
Cannabinoid 50 Ng, Ur ~~LOC~~: NOT DETECTED
Cocaine Metabolite,Ur ~~LOC~~: NOT DETECTED
MDMA (Ecstasy)Ur Screen: NOT DETECTED
METHADONE SCREEN, URINE: NOT DETECTED
OPIATE, UR SCREEN: NOT DETECTED
Phencyclidine (PCP) Ur S: NOT DETECTED
TRICYCLIC, UR SCREEN: NOT DETECTED

## 2016-12-06 LAB — APTT: aPTT: 32 seconds (ref 24–36)

## 2016-12-06 LAB — PROTIME-INR
INR: 1.83
PROTHROMBIN TIME: 21 s — AB (ref 11.4–15.2)

## 2016-12-06 LAB — ETHANOL: Alcohol, Ethyl (B): <10 mg/dL (ref <10)

## 2016-12-06 LAB — TROPONIN I: Troponin I: <0.03 ng/mL (ref <0.03)

## 2016-12-06 NOTE — ED Notes (Signed)
Noted steady gate when escorted to in room toilet, no assistance required

## 2016-12-06 NOTE — ED Notes (Signed)
Patient does not appear to be in any acute distress at time of discharge. Patient ambulatory to lobby with steady gate. Patient denies any comments or concerns regarding discharge.  

## 2016-12-06 NOTE — ED Triage Notes (Signed)
FIRST NURSE NOTE-pt was normal this morning. Got to [facility] at 845 am and became unable to answer questions; could not think or speak clearly.  Husband reports this has now resolved completely. Pt is alert and oriented and can answer questions.  Pt on coumadin and plavix, hx CVA and TIA. Sx resolved within 30 ish minutes per husband.   Discussed with dr Clearnce Hasten, will do stroke work up but not call code stroke since resolved.

## 2016-12-06 NOTE — ED Provider Notes (Signed)
[facility] Emergency Department Provider Note  ____________________________________________   First MD Initiated Contact with Patient 12/06/16 [phone]     (approximate)  I have reviewed the triage vital signs and the nursing notes.   HISTORY  Chief Complaint Aphasia   HPI Ardys SMT. LODER is a 83 y.o. female with a history of atrial fibrillation on Plavix and Coumadin who is presenting to the emergency department today with a 20-minute episode of altered mental status that started earlier this morning at 8:45 AM.  The patient was being seen at the [facility] clinic for a rash on her feet when she became acutely altered, could not remember her medications.  Husband witnessed the episode and says that he did not notice any facial drooping or drooling.  The patient denies any weakness.  The patient's husband says that this is exactly how his stroke presented last year.  Patient without any complaints at this time.  Past Medical History:  Diagnosis Date  . Dysrhythmia    A-fib  . Edema    feet/legs  . Hypothyroidism   . Incontinence   . Shortness of breath dyspnea    with exersion  . Sleep apnea   . Stroke ([place])   . TIA (transient ischemic attack)     Patient Active Problem List   Diagnosis Date Noted  . TIA (transient ischemic attack) 02/25/2016  . UTI (urinary tract infection) 02/25/2016    Past Surgical History:  Procedure Laterality Date  . CATARACT EXTRACTION W/PHACO Right 04/20/2015   Procedure: CATARACT EXTRACTION PHACO AND INTRAOCULAR LENS PLACEMENT (IOC);  Surgeon: Leandrew Koyanagi, MD;  Location: [facility] ORS;  Service: Ophthalmology;  Laterality: Right;  [place]   1:12.1 AP%  16.2 CDE   11.69 fluid cassette lot# [id number]   exp.07/04/2016  . CATARACT EXTRACTION W/PHACO Left 06/01/2015   Procedure: CATARACT EXTRACTION PHACO AND INTRAOCULAR LENS PLACEMENT (IOC);  Surgeon: Leandrew Koyanagi, MD;  Location: [facility] ORS;  Service: Ophthalmology;   Laterality: Left;  [place] 1.17 AP% 9.9 CDE 765 Fluid Pack Lot # [id number]  . CESAREAN SECTION    . CHOLECYSTECTOMY    . COLONOSCOPY    . FOOT SURGERY      Prior to Admission medications   Medication Sig Start Date End Date Taking? Authorizing Provider  atenolol (TENORMIN) 25 MG tablet Take 1 tablet by mouth daily. 11/04/16  Yes [provider]  Cetirizine HCl (ZYRTEC ALLERGY) 10 MG CAPS Take 1 tablet by mouth daily.   Yes [provider]  clopidogrel (PLAVIX) 75 MG tablet Take 75 mg by mouth daily.   Yes [provider]  furosemide (LASIX) 20 MG tablet Take 1 tablet by mouth daily. 11/17/16  Yes [provider]  levothyroxine (SYNTHROID, LEVOTHROID) 50 MCG tablet Take 50 mcg by mouth daily before breakfast.   Yes [provider]  Menthol-Zinc Oxide (CALMOSEPTINE) 0.44-20.6 % OINT Apply 1 application topically daily. 11/07/16  Yes [provider]  oxybutynin (DITROPAN) 5 MG tablet Take 5 mg by mouth 3 (three) times daily.   Yes [provider]  triamcinolone cream (KENALOG) 0.1 % Apply 1 application topically 2 (two) times daily.   Yes [provider]  vitamin B-12 (CYANOCOBALAMIN) 500 MCG tablet Take 1 tablet by mouth 2 (two) times daily.   Yes [provider]  warfarin (COUMADIN) 4 MG tablet Take 4 mg by mouth every morning. Monday -Saturday   Yes [provider]  warfarin (COUMADIN) 5 MG tablet Take 5 mg by  mouth once a week. Sunday   Yes [provider]  cephALEXin (KEFLEX) 500 MG capsule Take 1 capsule (500 mg total) by mouth 3 (three) times daily. Patient not taking: Reported on 12/06/2016 02/25/16   Loletha Grayer, MD  ketoconazole (NIZORAL) 2 % cream Apply 1 application topically daily. 11/07/16   [provider]  KRILL OIL PO Take 1 tablet by mouth as needed.    [provider]  nitroGLYCERIN (NITROSTAT) 0.4 MG SL tablet Place 0.4 mg under the tongue every 5 (five) minutes  as needed for chest pain.    [provider]    Allergies Ciprofloxacin; Contrast media [iodinated diagnostic agents]; Nitrofurantoin; and Penicillin g  Family History  Problem Relation Age of Onset  . Atrial fibrillation Son   . Diabetes Daughter     Social History Social History  Substance Use Topics  . Smoking status: Never Smoker  . Smokeless tobacco: Never Used  . Alcohol use No    Review of Systems  Constitutional: No fever/chills Eyes: No visual changes. ENT: No sore throat. Cardiovascular: Denies chest pain. Respiratory: Denies shortness of breath. Gastrointestinal: No abdominal pain.  No nausea, no vomiting.  No diarrhea.  No constipation. Genitourinary: Negative for dysuria. Musculoskeletal: Negative for back pain. Skin: Negative for rash. Neurological: Negative for headaches, focal weakness or numbness.   ____________________________________________   PHYSICAL EXAM:  VITAL SIGNS: ED Triage Vitals  Enc Vitals Group     BP 12/06/16 0931 (!) 134/103     Pulse Rate 12/06/16 0931 74     Resp 12/06/16 0931 18     Temp 12/06/16 0931 98.5 F (36.9 C)     Temp Source 12/06/16 0931 Oral     SpO2 12/06/16 0931 95 %     Weight 12/06/16 0932 211 lb (95.7 kg)     Height 12/06/16 0932 5' (1.524 m)     Head Circumference --      Peak Flow --      Pain Score --      Pain Loc --      Pain Edu? --      Excl. in [place]? --     Constitutional: Alert and oriented. Well appearing and in no acute distress. Eyes: Conjunctivae are normal.  Head: Atraumatic. Nose: No congestion/rhinnorhea. Mouth/Throat: Mucous membranes are moist.  Neck: No stridor.   Cardiovascular: Normal rate, regular rhythm. Grossly normal heart sounds.   Respiratory: Normal respiratory effort.  No retractions. Lungs CTAB. Gastrointestinal: Soft and nontender. No distention.  Musculoskeletal: No lower extremity tenderness nor edema.  No joint effusions. Neurologic:  Normal speech and  language. No gross focal neurologic deficits are appreciated. Skin:  Skin is warm, dry and intact. No rash noted. Psychiatric: Mood and affect are normal. Speech and behavior are normal.  NIH Stroke Scale   Time: 12:14 PM Person Administering Scale: Doran Stabler  Administer stroke scale items in the order listed. Record performance in each category after each subscale exam. Do not go back and change scores. Follow directions provided for each exam technique. Scores should reflect what the patient does, not what the clinician thinks the patient can do. The clinician should record answers while administering the exam and work quickly. Except where indicated, the patient should not be coached (i.e., repeated requests to patient to make a special effort).   1a  Level of consciousness: 0=alert; keenly responsive  1b. LOC questions:  0=Performs both tasks correctly  1c. LOC commands: 0=Performs both  tasks correctly  2.  Best Gaze: 0=normal  3.  Visual: 0=No visual loss  4. Facial Palsy: 0=Normal symmetric movement  5a.  Motor left arm: 0=No drift, limb holds 90 (or 45) degrees for full 10 seconds  5b.  Motor right arm: 0=No drift, limb holds 90 (or 45) degrees for full 10 seconds  6a. motor left leg: 0=No drift, limb holds 90 (or 45) degrees for full 10 seconds  6b  Motor right leg:  0=No drift, limb holds 90 (or 45) degrees for full 10 seconds  7. Limb Ataxia: 0=Absent  8.  Sensory: 0=Normal; no sensory loss  9. Best Language:  0=No aphasia, normal  10. Dysarthria: 0=Normal  11. Extinction and Inattention: 0=No abnormality  12. Distal motor function: 0=Normal   Total:   0    ____________________________________________   LABS (all labs ordered are listed, but only abnormal results are displayed)  Labs Reviewed  COMPREHENSIVE METABOLIC PANEL - Abnormal; Notable for the following:       Result Value   Potassium 3.3 (*)    Calcium 8.5 (*)    ALT 10 (*)    Total Bilirubin 1.7  (*)    All other components within normal limits  URINALYSIS, ROUTINE W REFLEX MICROSCOPIC - Abnormal; Notable for the following:    Color, Urine YELLOW (*)    APPearance HAZY (*)    Specific Gravity, Urine 1.004 (*)    Leukocytes, UA SMALL (*)    Squamous Epithelial / LPF 0-5 (*)    All other components within normal limits  ETHANOL  CBC  DIFFERENTIAL  TROPONIN I  URINE DRUG SCREEN, QUALITATIVE ([facility] ONLY)  PROTIME-INR  APTT   ____________________________________________  EKG  ED ECG REPORT I, Doran Stabler, the attending physician, personally viewed and interpreted this ECG.   Date: 12/06/2016  EKG Time: 0953  Rate: 74  Rhythm: atrial fibrillation, rate 74  Axis: Normal  Intervals:none  ST&T Change: No ST segment elevation or depression.  T wave inversions in 3 and aVF as well as V3 through 6.  Also with minimal depressions in V4 through V6. No significant change from the previous EKG of July 11, 2016. ____________________________________________  RADIOLOGY   CT with mild diffuse cortical atrophy without any acute findings ____________________________________________   PROCEDURES  Procedure(s) performed:   Procedures  Critical Care performed:   ____________________________________________   INITIAL IMPRESSION / ASSESSMENT AND PLAN / ED COURSE  Pertinent labs & imaging results that were available during my care of the patient were reviewed by me and considered in my medical decision making (see chart for details).  Differential diagnosis includes, but is not limited to, alcohol, illicit or prescription medications, or other toxic ingestion; intracranial pathology such as stroke or intracerebral hemorrhage; fever or infectious causes including sepsis; hypoxemia and/or hypercarbia; uremia; trauma; endocrine related disorders such as diabetes, hypoglycemia, and thyroid-related diseases; hypertensive encephalopathy; etc.  As part of my medical decision  making, I reviewed the following data within the [place] chart reviewed  Patient asymptomatic at this time.  Likely TIA.  Stroke alert was not called.  ----------------------------------------- 10:08 AM on 12/06/2016 -----------------------------------------  I discussed the case with Dr. Doy Mince of neurology who says the patient could be admitted for observation overnight.  However, if the patient is therapeutic on Coumadin there may be limited utility for further workup emergently save a carotid ultrasound which could lead to an endarterectomy.    12/06/2016 at 12:14 PM  Patient continues to be asymptomatic at this time.  Reassuring workup without any acute findings.  Discussed plan with patient regarding admission versus being discharged.  She says that she would rather be discharged home and follow-up with Dr. Roque Lias.  Dr. Doy Mince seem to think that this will be an appropriate plan to since the patient is already on maximal medical therapy.  The patient had an INR of 2.2 just 1 day ago and was told to maintain this current dose.  She has not taken her Coumadin yet this morning so expect that her level at this time would be slightly lower and possibly not therapeutic.  However, she is due to take her medication today which should make her therapeutic once again.  Patient is understanding of the likely diagnosis of TIA and would still prefer to be discharged home for outpatient follow-up.  ____________________________________________   FINAL CLINICAL IMPRESSION(S) / ED DIAGNOSES  TIA    NEW MEDICATIONS STARTED DURING THIS VISIT:  New Prescriptions   No medications on file     Note:  This document was prepared using Dragon voice recognition software and may include unintentional dictation errors.     Orbie Pyo, MD 12/06/16 1215

## 2016-12-06 NOTE — ED Triage Notes (Signed)
Pt had episode of being unable to speak while at [facility] today at 0845AM. Lasted for 15 minutes per husband. Pt awoke and was acting normal today, able to speak without difficulty. Symptoms resolved at this time. Pt alert and oriented X4, active, cooperative, pt in NAD. RR even and unlabored, color WNL.

## 2016-12-06 NOTE — ED Notes (Signed)
Patient returned from CT

## 2016-12-06 NOTE — ED Notes (Signed)
Patient transported to CT 

## 2016-12-12 ENCOUNTER — Ambulatory Visit
Admission: RE | Admit: 2016-12-12 | Discharge: 2016-12-12 | Disposition: A | Discharge status: Home / Self Care | Payer: Medicare HMO | Source: Ambulatory Visit | Attending: Internal Medicine | Admitting: Internal Medicine

## 2016-12-12 DIAGNOSIS — Z1231 Encounter for screening mammogram for malignant neoplasm of breast: Secondary | ICD-10-CM | POA: Insufficient documentation

## 2017-01-02 DIAGNOSIS — Z7901 Long term (current) use of anticoagulants: Secondary | ICD-10-CM | POA: Diagnosis not present

## 2017-01-30 DIAGNOSIS — Z7901 Long term (current) use of anticoagulants: Secondary | ICD-10-CM | POA: Diagnosis not present

## 2017-02-26 DIAGNOSIS — Z7901 Long term (current) use of anticoagulants: Secondary | ICD-10-CM | POA: Diagnosis not present

## 2017-03-27 DIAGNOSIS — Z6841 Body Mass Index (BMI) 40.0 and over, adult: Secondary | ICD-10-CM | POA: Diagnosis not present

## 2017-03-27 DIAGNOSIS — E039 Hypothyroidism, unspecified: Secondary | ICD-10-CM | POA: Diagnosis not present

## 2017-03-27 DIAGNOSIS — L298 Other pruritus: Secondary | ICD-10-CM | POA: Diagnosis not present

## 2017-03-27 DIAGNOSIS — I4891 Unspecified atrial fibrillation: Secondary | ICD-10-CM | POA: Diagnosis not present

## 2017-03-27 DIAGNOSIS — Z0000 Encounter for general adult medical examination without abnormal findings: Secondary | ICD-10-CM | POA: Diagnosis not present

## 2017-03-27 DIAGNOSIS — Z7901 Long term (current) use of anticoagulants: Secondary | ICD-10-CM | POA: Diagnosis not present

## 2017-03-27 DIAGNOSIS — Z23 Encounter for immunization: Secondary | ICD-10-CM | POA: Diagnosis not present

## 2017-04-02 DIAGNOSIS — G4733 Obstructive sleep apnea (adult) (pediatric): Secondary | ICD-10-CM | POA: Diagnosis not present

## 2017-04-02 DIAGNOSIS — E039 Hypothyroidism, unspecified: Secondary | ICD-10-CM | POA: Diagnosis not present

## 2017-04-02 DIAGNOSIS — R001 Bradycardia, unspecified: Secondary | ICD-10-CM | POA: Diagnosis not present

## 2017-04-02 DIAGNOSIS — I4891 Unspecified atrial fibrillation: Secondary | ICD-10-CM | POA: Diagnosis not present

## 2017-04-02 DIAGNOSIS — Z6841 Body Mass Index (BMI) 40.0 and over, adult: Secondary | ICD-10-CM | POA: Diagnosis not present

## 2017-04-25 DIAGNOSIS — Z7901 Long term (current) use of anticoagulants: Secondary | ICD-10-CM | POA: Diagnosis not present

## 2017-05-14 DIAGNOSIS — J40 Bronchitis, not specified as acute or chronic: Secondary | ICD-10-CM | POA: Diagnosis not present

## 2017-05-14 DIAGNOSIS — I480 Paroxysmal atrial fibrillation: Secondary | ICD-10-CM | POA: Diagnosis not present

## 2017-05-14 DIAGNOSIS — R002 Palpitations: Secondary | ICD-10-CM | POA: Diagnosis not present

## 2017-05-14 DIAGNOSIS — I361 Nonrheumatic tricuspid (valve) insufficiency: Secondary | ICD-10-CM | POA: Diagnosis not present

## 2017-05-14 DIAGNOSIS — I071 Rheumatic tricuspid insufficiency: Secondary | ICD-10-CM | POA: Diagnosis not present

## 2017-05-14 DIAGNOSIS — R05 Cough: Secondary | ICD-10-CM | POA: Diagnosis not present

## 2017-05-14 DIAGNOSIS — R0609 Other forms of dyspnea: Secondary | ICD-10-CM | POA: Diagnosis not present

## 2017-05-14 DIAGNOSIS — I10 Essential (primary) hypertension: Secondary | ICD-10-CM | POA: Diagnosis not present

## 2017-05-26 DIAGNOSIS — Z7901 Long term (current) use of anticoagulants: Secondary | ICD-10-CM | POA: Diagnosis not present

## 2017-05-26 DIAGNOSIS — I4891 Unspecified atrial fibrillation: Secondary | ICD-10-CM | POA: Diagnosis not present

## 2017-06-26 DIAGNOSIS — I4891 Unspecified atrial fibrillation: Secondary | ICD-10-CM | POA: Diagnosis not present

## 2017-06-26 DIAGNOSIS — Z7901 Long term (current) use of anticoagulants: Secondary | ICD-10-CM | POA: Diagnosis not present

## 2017-07-12 ENCOUNTER — Encounter: Payer: Self-pay | Admitting: Emergency Medicine

## 2017-07-12 ENCOUNTER — Other Ambulatory Visit: Payer: Self-pay

## 2017-07-12 DIAGNOSIS — I4891 Unspecified atrial fibrillation: Secondary | ICD-10-CM | POA: Diagnosis not present

## 2017-07-12 DIAGNOSIS — Z7952 Long term (current) use of systemic steroids: Secondary | ICD-10-CM

## 2017-07-12 DIAGNOSIS — Z888 Allergy status to other drugs, medicaments and biological substances status: Secondary | ICD-10-CM | POA: Diagnosis not present

## 2017-07-12 DIAGNOSIS — Z7901 Long term (current) use of anticoagulants: Secondary | ICD-10-CM | POA: Diagnosis not present

## 2017-07-12 DIAGNOSIS — D500 Iron deficiency anemia secondary to blood loss (chronic): Secondary | ICD-10-CM | POA: Diagnosis not present

## 2017-07-12 DIAGNOSIS — Z7902 Long term (current) use of antithrombotics/antiplatelets: Secondary | ICD-10-CM | POA: Diagnosis not present

## 2017-07-12 DIAGNOSIS — Z881 Allergy status to other antibiotic agents status: Secondary | ICD-10-CM

## 2017-07-12 DIAGNOSIS — K644 Residual hemorrhoidal skin tags: Secondary | ICD-10-CM | POA: Diagnosis present

## 2017-07-12 DIAGNOSIS — K922 Gastrointestinal hemorrhage, unspecified: Secondary | ICD-10-CM | POA: Diagnosis not present

## 2017-07-12 DIAGNOSIS — K625 Hemorrhage of anus and rectum: Secondary | ICD-10-CM | POA: Diagnosis not present

## 2017-07-12 DIAGNOSIS — G4733 Obstructive sleep apnea (adult) (pediatric): Secondary | ICD-10-CM | POA: Diagnosis present

## 2017-07-12 DIAGNOSIS — K621 Rectal polyp: Secondary | ICD-10-CM | POA: Diagnosis present

## 2017-07-12 DIAGNOSIS — Z880 Allergy status to penicillin: Secondary | ICD-10-CM | POA: Diagnosis not present

## 2017-07-12 DIAGNOSIS — E669 Obesity, unspecified: Secondary | ICD-10-CM | POA: Diagnosis present

## 2017-07-12 DIAGNOSIS — Z9842 Cataract extraction status, left eye: Secondary | ICD-10-CM | POA: Diagnosis not present

## 2017-07-12 DIAGNOSIS — Z9841 Cataract extraction status, right eye: Secondary | ICD-10-CM | POA: Diagnosis not present

## 2017-07-12 DIAGNOSIS — Z79899 Other long term (current) drug therapy: Secondary | ICD-10-CM

## 2017-07-12 DIAGNOSIS — Z91041 Radiographic dye allergy status: Secondary | ICD-10-CM | POA: Diagnosis not present

## 2017-07-12 DIAGNOSIS — Z961 Presence of intraocular lens: Secondary | ICD-10-CM | POA: Diagnosis present

## 2017-07-12 DIAGNOSIS — Z86010 Personal history of colonic polyps: Secondary | ICD-10-CM

## 2017-07-12 DIAGNOSIS — Z6835 Body mass index (BMI) 35.0-35.9, adult: Secondary | ICD-10-CM

## 2017-07-12 DIAGNOSIS — Z8673 Personal history of transient ischemic attack (TIA), and cerebral infarction without residual deficits: Secondary | ICD-10-CM | POA: Diagnosis not present

## 2017-07-12 DIAGNOSIS — Z79890 Hormone replacement therapy (postmenopausal): Secondary | ICD-10-CM

## 2017-07-12 DIAGNOSIS — R739 Hyperglycemia, unspecified: Secondary | ICD-10-CM | POA: Diagnosis present

## 2017-07-12 DIAGNOSIS — Z9049 Acquired absence of other specified parts of digestive tract: Secondary | ICD-10-CM | POA: Diagnosis not present

## 2017-07-12 DIAGNOSIS — R350 Frequency of micturition: Secondary | ICD-10-CM | POA: Diagnosis present

## 2017-07-12 DIAGNOSIS — K5900 Constipation, unspecified: Secondary | ICD-10-CM | POA: Diagnosis present

## 2017-07-12 DIAGNOSIS — I482 Chronic atrial fibrillation: Secondary | ICD-10-CM | POA: Diagnosis present

## 2017-07-12 DIAGNOSIS — D126 Benign neoplasm of colon, unspecified: Secondary | ICD-10-CM | POA: Diagnosis not present

## 2017-07-12 DIAGNOSIS — E039 Hypothyroidism, unspecified: Secondary | ICD-10-CM | POA: Diagnosis present

## 2017-07-12 DIAGNOSIS — K6289 Other specified diseases of anus and rectum: Secondary | ICD-10-CM | POA: Diagnosis not present

## 2017-07-12 DIAGNOSIS — K921 Melena: Secondary | ICD-10-CM | POA: Diagnosis present

## 2017-07-12 DIAGNOSIS — K635 Polyp of colon: Secondary | ICD-10-CM | POA: Diagnosis not present

## 2017-07-12 DIAGNOSIS — Z833 Family history of diabetes mellitus: Secondary | ICD-10-CM | POA: Diagnosis not present

## 2017-07-12 DIAGNOSIS — Z789 Other specified health status: Secondary | ICD-10-CM | POA: Diagnosis not present

## 2017-07-12 LAB — COMPREHENSIVE METABOLIC PANEL
ALBUMIN: 3.8 g/dL (ref 3.5–5.0)
ALK PHOS: 80 U/L (ref 38–126)
ALT: 13 U/L — ABNORMAL LOW (ref 14–54)
ANION GAP: 8 (ref 5–15)
AST: 23 U/L (ref 15–41)
BUN: 11 mg/dL (ref 6–20)
CHLORIDE: 105 mmol/L (ref 101–111)
CO2: 26 mmol/L (ref 22–32)
Calcium: 8.9 mg/dL (ref 8.9–10.3)
Creatinine, Ser: 0.82 mg/dL (ref 0.44–1.00)
GFR calc Af Amer: >60 mL/min (ref >60)
GFR calc non Af Amer: >60 mL/min (ref >60)
GLUCOSE: 112 mg/dL — AB (ref 65–99)
POTASSIUM: 4.3 mmol/L (ref 3.5–5.1)
Sodium: 139 mmol/L (ref 135–145)
Total Bilirubin: 0.7 mg/dL (ref 0.3–1.2)
Total Protein: 7 g/dL (ref 6.5–8.1)

## 2017-07-12 LAB — TYPE AND SCREEN
ABO/RH(D): A NEG
Antibody Screen: NEGATIVE

## 2017-07-12 LAB — CBC
HEMATOCRIT: 39.5 % (ref 35.0–47.0)
HEMOGLOBIN: 13.4 g/dL (ref 12.0–16.0)
MCH: 32.6 pg (ref 26.0–34.0)
MCHC: 33.8 g/dL (ref 32.0–36.0)
MCV: 96.2 fL (ref 80.0–100.0)
Platelets: 216 10*3/uL (ref 150–440)
RBC: 4.11 MIL/uL (ref 3.80–5.20)
RDW: 14 % (ref 11.5–14.5)
WBC: 5.1 10*3/uL (ref 3.6–11.0)

## 2017-07-12 NOTE — ED Triage Notes (Addendum)
Pt reports some rectal bleeding that started this am; took laxative last night as well as 2 stool softeners for constipation; says she strained this am; bright red blood in toilet; pt denies abd pain; pt says if she has hemorrhoids they don't bother her; pt awake and alert; ambulatory holding on to husbands arm; denies dizziness; pt pale around her mouth but cheeks and skin pink;

## 2017-07-13 ENCOUNTER — Other Ambulatory Visit: Payer: Self-pay

## 2017-07-13 ENCOUNTER — Encounter: Payer: Self-pay | Admitting: Internal Medicine

## 2017-07-13 ENCOUNTER — Inpatient Hospital Stay
Admission: EM | Admit: 2017-07-13 | Discharge: 2017-07-14 | DRG: 379 | Disposition: A | Discharge status: Home / Self Care | Payer: Medicare HMO | Attending: Internal Medicine | Admitting: Internal Medicine

## 2017-07-13 DIAGNOSIS — Z91041 Radiographic dye allergy status: Secondary | ICD-10-CM | POA: Diagnosis not present

## 2017-07-13 DIAGNOSIS — Z8673 Personal history of transient ischemic attack (TIA), and cerebral infarction without residual deficits: Secondary | ICD-10-CM | POA: Diagnosis not present

## 2017-07-13 DIAGNOSIS — E039 Hypothyroidism, unspecified: Secondary | ICD-10-CM | POA: Insufficient documentation

## 2017-07-13 DIAGNOSIS — Z881 Allergy status to other antibiotic agents status: Secondary | ICD-10-CM | POA: Diagnosis not present

## 2017-07-13 DIAGNOSIS — Z9049 Acquired absence of other specified parts of digestive tract: Secondary | ICD-10-CM | POA: Diagnosis not present

## 2017-07-13 DIAGNOSIS — K6289 Other specified diseases of anus and rectum: Secondary | ICD-10-CM | POA: Diagnosis not present

## 2017-07-13 DIAGNOSIS — K921 Melena: Secondary | ICD-10-CM | POA: Diagnosis present

## 2017-07-13 DIAGNOSIS — E669 Obesity, unspecified: Secondary | ICD-10-CM | POA: Diagnosis present

## 2017-07-13 DIAGNOSIS — D126 Benign neoplasm of colon, unspecified: Secondary | ICD-10-CM | POA: Diagnosis not present

## 2017-07-13 DIAGNOSIS — I639 Cerebral infarction, unspecified: Secondary | ICD-10-CM | POA: Insufficient documentation

## 2017-07-13 DIAGNOSIS — Z7901 Long term (current) use of anticoagulants: Secondary | ICD-10-CM

## 2017-07-13 DIAGNOSIS — Z888 Allergy status to other drugs, medicaments and biological substances status: Secondary | ICD-10-CM | POA: Diagnosis not present

## 2017-07-13 DIAGNOSIS — Z880 Allergy status to penicillin: Secondary | ICD-10-CM | POA: Diagnosis not present

## 2017-07-13 DIAGNOSIS — K5900 Constipation, unspecified: Secondary | ICD-10-CM | POA: Diagnosis present

## 2017-07-13 DIAGNOSIS — Z79899 Other long term (current) drug therapy: Secondary | ICD-10-CM | POA: Diagnosis not present

## 2017-07-13 DIAGNOSIS — Z79890 Hormone replacement therapy (postmenopausal): Secondary | ICD-10-CM | POA: Diagnosis not present

## 2017-07-13 DIAGNOSIS — K644 Residual hemorrhoidal skin tags: Secondary | ICD-10-CM | POA: Diagnosis present

## 2017-07-13 DIAGNOSIS — Z789 Other specified health status: Secondary | ICD-10-CM | POA: Diagnosis present

## 2017-07-13 DIAGNOSIS — G4733 Obstructive sleep apnea (adult) (pediatric): Secondary | ICD-10-CM | POA: Insufficient documentation

## 2017-07-13 DIAGNOSIS — I482 Chronic atrial fibrillation, unspecified: Secondary | ICD-10-CM | POA: Insufficient documentation

## 2017-07-13 DIAGNOSIS — Z7952 Long term (current) use of systemic steroids: Secondary | ICD-10-CM | POA: Diagnosis not present

## 2017-07-13 DIAGNOSIS — Z9841 Cataract extraction status, right eye: Secondary | ICD-10-CM | POA: Diagnosis not present

## 2017-07-13 DIAGNOSIS — R739 Hyperglycemia, unspecified: Secondary | ICD-10-CM | POA: Diagnosis present

## 2017-07-13 DIAGNOSIS — Z961 Presence of intraocular lens: Secondary | ICD-10-CM | POA: Diagnosis present

## 2017-07-13 DIAGNOSIS — Z7902 Long term (current) use of antithrombotics/antiplatelets: Secondary | ICD-10-CM | POA: Diagnosis not present

## 2017-07-13 DIAGNOSIS — D500 Iron deficiency anemia secondary to blood loss (chronic): Secondary | ICD-10-CM | POA: Diagnosis not present

## 2017-07-13 DIAGNOSIS — IMO0001 Reserved for inherently not codable concepts without codable children: Secondary | ICD-10-CM | POA: Diagnosis present

## 2017-07-13 DIAGNOSIS — K621 Rectal polyp: Secondary | ICD-10-CM | POA: Diagnosis present

## 2017-07-13 DIAGNOSIS — Z9842 Cataract extraction status, left eye: Secondary | ICD-10-CM | POA: Diagnosis not present

## 2017-07-13 DIAGNOSIS — Z833 Family history of diabetes mellitus: Secondary | ICD-10-CM | POA: Diagnosis not present

## 2017-07-13 HISTORY — DX: Unspecified atrial fibrillation: I48.91

## 2017-07-13 LAB — PROTIME-INR
INR: 2.68
Prothrombin Time: 28.3 seconds — ABNORMAL HIGH (ref 11.4–15.2)

## 2017-07-13 LAB — APTT: APTT: 38 s — AB (ref 24–36)

## 2017-07-13 MED ORDER — WARFARIN SODIUM 4 MG PO TABS
4.0000 mg | ORAL_TABLET | ORAL | Status: DC
  Filled 2017-07-13: qty 1

## 2017-07-13 MED ORDER — WARFARIN - PHYSICIAN DOSING INPATIENT: Freq: Every day | Status: DC

## 2017-07-13 MED ORDER — WARFARIN SODIUM 5 MG PO TABS
5.0000 mg | ORAL_TABLET | ORAL | Status: DC
  Administered 2017-07-13: 5 mg via ORAL
  Filled 2017-07-13: qty 1

## 2017-07-13 MED ORDER — ACETAMINOPHEN 325 MG PO TABS
650.0000 mg | ORAL_TABLET | Freq: Four times a day (QID) | ORAL | Status: DC | PRN
  Administered 2017-07-13: 19:00:00 650 mg via ORAL
  Filled 2017-07-13: qty 2

## 2017-07-13 MED ORDER — PEG 3350-KCL-NA BICARB-NACL 420 G PO SOLR
4000.0000 mL | Freq: Once | ORAL | Status: AC
  Administered 2017-07-13: 4000 mL via ORAL
  Filled 2017-07-13: qty 4000

## 2017-07-13 MED ORDER — ACETAMINOPHEN 650 MG RE SUPP: 650.0000 mg | Freq: Four times a day (QID) | RECTAL | Status: DC | PRN

## 2017-07-13 MED ORDER — BISACODYL 5 MG PO TBEC
5.0000 mg | DELAYED_RELEASE_TABLET | Freq: Every day | ORAL | Status: DC | PRN
  Filled 2017-07-13: qty 1

## 2017-07-13 MED ORDER — SODIUM CHLORIDE 0.9 % IV SOLN
INTRAVENOUS | Status: DC
  Administered 2017-07-14: 13:00:00 via INTRAVENOUS

## 2017-07-13 MED ORDER — OXYBUTYNIN CHLORIDE 5 MG PO TABS
5.0000 mg | ORAL_TABLET | Freq: Three times a day (TID) | ORAL | Status: DC
  Filled 2017-07-13 (×2): qty 1

## 2017-07-13 MED ORDER — PANTOPRAZOLE SODIUM 40 MG PO TBEC
40.0000 mg | DELAYED_RELEASE_TABLET | Freq: Once | ORAL | Status: AC
  Administered 2017-07-13: 40 mg via ORAL
  Filled 2017-07-13: qty 1

## 2017-07-13 MED ORDER — CLOPIDOGREL BISULFATE 75 MG PO TABS
75.0000 mg | ORAL_TABLET | Freq: Every day | ORAL | Status: DC
  Administered 2017-07-13: 75 mg via ORAL
  Filled 2017-07-13: qty 1

## 2017-07-13 MED ORDER — SENNOSIDES-DOCUSATE SODIUM 8.6-50 MG PO TABS: 1.0000 | ORAL_TABLET | Freq: Every evening | ORAL | Status: DC | PRN

## 2017-07-13 MED ORDER — LEVOTHYROXINE SODIUM 50 MCG PO TABS
50.0000 ug | ORAL_TABLET | Freq: Every day | ORAL | Status: DC
  Filled 2017-07-13: qty 1

## 2017-07-13 MED ORDER — ONDANSETRON HCL 4 MG/2ML IJ SOLN
4.0000 mg | Freq: Four times a day (QID) | INTRAMUSCULAR | Status: DC | PRN
  Administered 2017-07-13: 4 mg via INTRAVENOUS
  Filled 2017-07-13: qty 2

## 2017-07-13 MED ORDER — ONDANSETRON HCL 4 MG PO TABS: 4.0000 mg | ORAL_TABLET | Freq: Four times a day (QID) | ORAL | Status: DC | PRN

## 2017-07-13 MED ORDER — BISACODYL 5 MG PO TBEC
10.0000 mg | DELAYED_RELEASE_TABLET | Freq: Once | ORAL | Status: AC
  Administered 2017-07-13: 22:00:00 10 mg via ORAL
  Filled 2017-07-13: qty 2

## 2017-07-13 MED ORDER — ATENOLOL 25 MG PO TABS
25.0000 mg | ORAL_TABLET | Freq: Every day | ORAL | Status: DC
  Administered 2017-07-13: 13:00:00 25 mg via ORAL
  Filled 2017-07-13 (×2): qty 1

## 2017-07-13 NOTE — Progress Notes (Signed)
[place] [facility] [place] NAME: Nicole Walton    MR#:  [id number]  DATE OF BIRTH:  07-08-1931  SUBJECTIVE:   Patient presented with bright red blood per rectum. No abdominal pain. Denies any history of hemorrhoids. REVIEW OF SYSTEMS:   Review of Systems  Constitutional: Negative for chills, fever and weight loss.  HENT: Negative for ear discharge, ear pain and nosebleeds.   Eyes: Negative for blurred vision, pain and discharge.  Respiratory: Negative for sputum production, shortness of breath, wheezing and stridor.   Cardiovascular: Negative for chest pain, palpitations, orthopnea and PND.  Gastrointestinal: Positive for blood in stool. Negative for abdominal pain, diarrhea, nausea and vomiting.  Genitourinary: Negative for frequency and urgency.  Musculoskeletal: Negative for back pain and joint pain.  Neurological: Negative for sensory change, speech change, focal weakness and weakness.  Psychiatric/Behavioral: Negative for depression and hallucinations. The patient is not nervous/anxious.    Tolerating Diet:yes Tolerating PT: ambulatory  DRUG ALLERGIES:   Allergies  Allergen Reactions  . Ciprofloxacin Other (See Comments)    Not sure of reaction   . Contrast Media [Iodinated Diagnostic Agents] Swelling    Of feet and legs.  (Betadine is OK to use)  . Nitrofurantoin Other (See Comments)    GI distress  . Penicillin G Other (See Comments)    Has patient had a PCN reaction causing immediate rash, facial/tongue/throat swelling, SOB or lightheadedness with hypotension: No Has patient had a PCN reaction causing severe rash involving mucus membranes or skin necrosis: No Has patient had a PCN reaction that required hospitalization: No Has patient had a PCN reaction occurring within the last 10 years: No If all of the above answers are "NO", then may proceed with Cephalosporin use.    VITALS:  Blood pressure (!) 161/78, pulse 62,  temperature (!) 97.3 F (36.3 C), temperature source Oral, resp. rate 16, height 5\' 4"  (1.626 m), weight 94.3 kg (208 lb), SpO2 99 %.  PHYSICAL EXAMINATION:   Physical Exam  GENERAL:  84 y.o.-year-old patient lying in the bed with no acute distress.  EYES: Pupils equal, round, reactive to light and accommodation. No scleral icterus. Extraocular muscles intact.  HEENT: Head atraumatic, normocephalic. Oropharynx and nasopharynx clear.  NECK:  Supple, no jugular venous distention. No thyroid enlargement, no tenderness.  LUNGS: Normal breath sounds bilaterally, no wheezing, rales, rhonchi. No use of accessory muscles of respiration.  CARDIOVASCULAR: S1, S2 normal. No murmurs, rubs, or gallops.  ABDOMEN: Soft, nontender, nondistended. Bowel sounds present. No organomegaly or mass.  EXTREMITIES: No cyanosis, clubbing or edema b/l.    NEUROLOGIC: Cranial nerves II through XII are intact. No focal Motor or sensory deficits b/l.   PSYCHIATRIC:  patient is alert and oriented x 3.  SKIN: No obvious rash, lesion, or ulcer.   LABORATORY PANEL:  CBC Recent Labs  Lab 07/12/17 2217  WBC 5.1  HGB 13.4  HCT 39.5  PLT 216    Chemistries  Recent Labs  Lab 07/12/17 2217  NA 139  K 4.3  CL 105  CO2 26  GLUCOSE 112*  BUN 11  CREATININE 0.82  CALCIUM 8.9  AST 23  ALT 13*  ALKPHOS 80  BILITOT 0.7   Cardiac Enzymes No results for input(s): TROPONINI in the last 168 hours. RADIOLOGY:  No results found. ASSESSMENT AND PLAN:  Nicole Walton is a 84 y.o. female  Presenting with acute LGI bleed. Patient has hx of tubular adenomatous polyps  01/23/2012 by Dr. Verdie Shire formerly of [facility]. No mention was made of colonic diverticular disease at that time.  Patient noticed having some bout of constipation with bloating 2 days ago. She took an OTC laxative which caused her to have a bowel movement which may have been non-bloody.  1. BRBPR likely diverticular vs hemorrhoidal. -on anticoagulation  meds -[facility] gi Dr Ricky Stabs input appreciated -colonoscopy plan in the morning.  -Hold Plavix and Coumadin  -hemoglobin stable -patient is Je[employer]'s [employer]  2. chronic a fib  -continue atenolol. Hold Coumadin   3.hypothyroidism continue Synthroid   4.h/o TIA  5. DVT on coumadin (but holding due to GI bleed)   Case discussed with Care Management/Social Worker. Management plans discussed with the patient, family and they are in agreement.  CODE STATUS: full  DVT Prophylaxis:  AMbulation  TOTAL TIME TAKING CARE OF THIS PATIENT: *30* minutes.  >50% time spent on counselling and coordination of care  POSSIBLE D/C IN *1-2* DAYS, DEPENDING ON CLINICAL CONDITION.  Note: This dictation was prepared with Dragon dictation along with smaller phrase technology. Any transcriptional errors that result from this process are unintentional.  Fritzi Mandes M.D on 07/13/2017 at 1:08 PM  Between 7am to 6pm - Pager - [phone]  After 6pm go to [email or web address] - password [email or web address] [facility]  Office  [phone]  CC: Primary care physician; Tracie Harrier, MDPatient ID: Nicole Walton, female   DOB: May 29, 1931, 84 y.o.   MRN: [id number]

## 2017-07-13 NOTE — Progress Notes (Signed)
Spoke with patient about wearing CPAP.  She states she did not bring hers and does not want to wear ours.  She states she will be fine without wearing CPAP during her stay in the hospital.  I asked to please let her nurse know to contact us if she changes her mind and we will bring her one of our CPAP machines.

## 2017-07-13 NOTE — ED Notes (Signed)
Pt reports 1 episode of bright red blood in stool, reports taking warfarin and Plavix for afib, pt denies dizziness, new SOB or hx of diverticulitis, hemorrhoids, or bowel obstruction  Pt with family

## 2017-07-13 NOTE — ED Provider Notes (Signed)
[facility] Emergency Department Provider Note  ____________________________________________   First MD Initiated Contact with Patient 07/13/17 0153     (approximate)  I have reviewed the triage vital signs and the nursing notes.   HISTORY  Chief Complaint Rectal Bleeding    HPI Nicole Walton is a 84 y.o. female whose medical history includes atrial fibrillation on warfarin and Plavix.  She presents for evaluation of acute onset bright red blood per rectum.  Reports that she does have a history of hemorrhoids but they usually do not bother past.  Straining at bowel movement yesterday when she noticed that she had some fresh blood on the toilet paper.  That is continued she does not have any increased urgency to have bowel movements but has noted a mild to moderate amount of blood in the stool and on the paper.  She said that she has some mild and vague generalized abdominal discomfort but no specific pain.  She denies fever/chills, chest pain, shortness of breath, nausea, and vomiting.  No prior history of GI bleeding.  Nothing in particular makes her symptoms better or worse.  Past Medical History:  Diagnosis Date  . Atrial fibrillation ([place])   . Dysrhythmia    A-fib  . Edema    feet/legs  . Hypothyroidism   . Incontinence   . Shortness of breath dyspnea    with exersion  . Sleep apnea   . Stroke ([place])   . TIA (transient ischemic attack)     Patient Active Problem List   Diagnosis Date Noted  . Hematochezia 07/13/2017  . Patient is Jehovah's Witness 07/13/2017  . Atrial fibrillation, chronic ([place]) 07/13/2017  . CVA (cerebral vascular accident) ([place]) 07/13/2017  . Hypothyroidism 07/13/2017  . OSA (obstructive sleep apnea) 07/13/2017  . TIA (transient ischemic attack) 02/25/2016  . UTI (urinary tract infection) 02/25/2016    Past Surgical History:  Procedure Laterality Date  . CATARACT EXTRACTION W/PHACO Right 04/20/2015   Procedure:  CATARACT EXTRACTION PHACO AND INTRAOCULAR LENS PLACEMENT (IOC);  Surgeon: Leandrew Koyanagi, MD;  Location: [facility] ORS;  Service: Ophthalmology;  Laterality: Right;  [place]   1:12.1 AP%  16.2 CDE   11.69 fluid cassette lot# [id number]   exp.07/04/2016  . CATARACT EXTRACTION W/PHACO Left 06/01/2015   Procedure: CATARACT EXTRACTION PHACO AND INTRAOCULAR LENS PLACEMENT (IOC);  Surgeon: Leandrew Koyanagi, MD;  Location: [facility] ORS;  Service: Ophthalmology;  Laterality: Left;  [place] 1.17 AP% 9.9 CDE 765 Fluid Pack Lot # [id number]  . CESAREAN SECTION    . CHOLECYSTECTOMY    . COLONOSCOPY    . FOOT SURGERY      Prior to Admission medications   Medication Sig Start Date End Date Taking? Authorizing Provider  atenolol (TENORMIN) 25 MG tablet Take 1 tablet by mouth daily. 11/04/16  Yes [provider]  Calcium-Magnesium-Vitamin D (CALCIUM 500 PO) Take 1 tablet by mouth daily.   Yes [provider]  clopidogrel (PLAVIX) 75 MG tablet Take 75 mg by mouth daily.   Yes [provider]  furosemide (LASIX) 20 MG tablet Take 80 mg by mouth daily.  11/17/16  Yes [provider]  ketoconazole (NIZORAL) 2 % cream Apply 1 application topically daily. 11/07/16  Yes [provider]  KRILL OIL PO Take 1 tablet by mouth as needed.   Yes [provider]  levothyroxine (SYNTHROID, LEVOTHROID) 50 MCG tablet Take 50 mcg by mouth daily before breakfast.   Yes [provider]  Menthol-Zinc Oxide (  CALMOSEPTINE) 0.44-20.6 % OINT Apply 1 application topically daily. 11/07/16  Yes [provider]  nitroGLYCERIN (NITROSTAT) 0.4 MG SL tablet Place 0.4 mg under the tongue every 5 (five) minutes as needed for chest pain.   Yes [provider]  oxybutynin (DITROPAN) 5 MG tablet Take 5 mg by mouth 3 (three) times daily.   Yes [provider]  triamcinolone cream (KENALOG) 0.1 % Apply 1 application topically 2 (two) times daily.   Yes [provider]  warfarin (COUMADIN) 4 MG tablet Take 4 mg by mouth every morning. Monday -Saturday   Yes [provider]  warfarin (COUMADIN) 5 MG tablet Take 5 mg by mouth once a week. Sunday   Yes [provider]  Cetirizine HCl (ZYRTEC ALLERGY) 10 MG CAPS Take 1 tablet by mouth daily.    [provider]  vitamin B-12 (CYANOCOBALAMIN) 500 MCG tablet Take 1 tablet by mouth 2 (two) times daily.    [provider]    Allergies Ciprofloxacin; Contrast media [iodinated diagnostic agents]; Nitrofurantoin; and Penicillin g  Family History  Problem Relation Age of Onset  . Atrial fibrillation Son   . Diabetes Daughter   . Breast cancer Neg Hx     Social History Social History   Tobacco Use  . Smoking status: Never Smoker  . Smokeless tobacco: Never Used  Substance Use Topics  . Alcohol use: No  . Drug use: No    Review of Systems Constitutional: No fever/chills Eyes: No visual changes. ENT: No sore throat. Cardiovascular: Denies chest pain. Respiratory: Denies shortness of breath. Gastrointestinal: Bright red blood per rectum intermittently for about 24 hours.  Vague abdominal discomfort, no specific pain..  No nausea, no vomiting.  No diarrhea.  No constipation. Genitourinary: Negative for dysuria. Musculoskeletal: Negative for neck pain.  Negative for back pain. Integumentary: Negative for rash. Neurological: Negative for headaches, focal weakness or numbness.   ____________________________________________   PHYSICAL EXAM:  VITAL SIGNS: ED Triage Vitals  Enc Vitals Group     BP 07/12/17 2202 (!) 161/69     Pulse Rate 07/12/17 2202 63     Resp 07/12/17 2202 18     Temp 07/12/17 2202 97.8 F (36.6 C)     Temp Source 07/12/17 2202 Oral     SpO2 07/12/17 2202 97 %     Weight 07/12/17 2203 94.3 kg (208 lb)     Height 07/12/17 2203 1.626 m (5\' 4" )     Head Circumference --      Peak Flow --      Pain Score 07/12/17 2202 0     Pain Loc --       Pain Edu? --      Excl. in [place]? --     Constitutional: Alert and oriented.  No acute distress. Eyes: Conjunctivae are normal.  Head: Atraumatic. Nose: No congestion/rhinnorhea. Mouth/Throat: Mucous membranes are moist. Neck: No stridor.  No meningeal signs.   Cardiovascular: Normal rate, regular rhythm. Good peripheral circulation. Grossly normal heart sounds. Respiratory: Normal respiratory effort.  No retractions. Lungs CTAB. Gastrointestinal: Obese.  Soft and nontender. No distention.  Rectal: Small external hemorrhoid that does not appear to be bleeding.  Bright red blood within the rectal vault present on digital exam.  ED chaperone present throughout exam. Musculoskeletal: No lower extremity tenderness nor edema. No gross deformities of extremities. Neurologic:  Normal speech and language. No gross focal neurologic deficits are appreciated.  Skin:  Skin is warm, dry and  intact. No rash noted. Psychiatric: Mood and affect are normal. Speech and behavior are normal.  ____________________________________________   LABS (all labs ordered are listed, but only abnormal results are displayed)  Labs Reviewed  COMPREHENSIVE METABOLIC PANEL - Abnormal; Notable for the following components:      Result Value   Glucose, Bld 112 (*)    ALT 13 (*)    All other components within normal limits  PROTIME-INR - Abnormal; Notable for the following components:   Prothrombin Time 28.3 (*)    All other components within normal limits  APTT - Abnormal; Notable for the following components:   aPTT 38 (*)    All other components within normal limits  CBC  TYPE AND SCREEN   ____________________________________________  EKG  ED ECG REPORT I, Hinda Kehr, the attending physician, personally viewed and interpreted this ECG.  Date: 07/12/2017 EKG Time: 22: 14 Rate: 63 Rhythm: Atrial fibrillation QRS Axis: normal Intervals: normal ST/T Wave abnormalities: Inverted T waves and most leads,  with the exceptions being 1, aVL, 2 Narrative Interpretation: no evidence of acute ischemia   ____________________________________________  RADIOLOGY   ED MD interpretation: No indication for imaging  Official radiology report(s): No results found.  ____________________________________________   PROCEDURES  Critical Care performed: No   Procedure(s) performed:   Procedures   ____________________________________________   INITIAL IMPRESSION / ASSESSMENT AND PLAN / ED COURSE  As part of my medical decision making, I reviewed the following data within the [place] History obtained from family, Nursing notes reviewed and incorporated, Labs reviewed , EKG interpreted , Old chart reviewed and Discussed with admitting physician     Differential diagnosis includes, but is not limited to, spontaneous bleeding due to anticoagulation, AV malformation, diverticular bleed, neoplasm.  Patient is hemodynamically stable at this time but is anticoagulated out of necessity due to chronic A. fib and having new onset bright red blood per rectum suggestive of a lower GI bleed rather than upper.  No indication for emergent intervention, but she would benefit from admission and close monitoring for serial H&H and further evaluation to determine the source of her bleeding.  Patient and her husband agree with the plan.  Hemoglobin is stable and lab work is generally reassuring.  INR is 2.68.  Patient is a negative and has a normal metabolic panel.  I will call to discuss with hospitalist for admission.  Clinical Course as of Jul 14 502  Nancy Fetter Jul 13, 2017  0228 Discussed with hospitalist who will admit.   [CF]  0229 Patient hemodynamically stable   [CF]    Clinical Course User Index [CF] Hinda Kehr, MD    ____________________________________________  FINAL CLINICAL IMPRESSION(S) / ED DIAGNOSES  Final diagnoses:  Hematochezia  Warfarin anticoagulation      MEDICATIONS GIVEN DURING THIS VISIT:  Medications  senna-docusate (Senokot-S) tablet 1 tablet (has no administration in time range)  bisacodyl (DULCOLAX) EC tablet 5 mg (has no administration in time range)  ondansetron (ZOFRAN) tablet 4 mg (has no administration in time range)    Or  ondansetron (ZOFRAN) injection 4 mg (has no administration in time range)  acetaminophen (TYLENOL) tablet 650 mg (has no administration in time range)    Or  acetaminophen (TYLENOL) suppository 650 mg (has no administration in time range)  atenolol (TENORMIN) tablet 25 mg (has no administration in time range)  clopidogrel (PLAVIX) tablet 75 mg (has no administration in time range)  levothyroxine (SYNTHROID, LEVOTHROID) tablet 50 mcg (has  no administration in time range)  oxybutynin (DITROPAN) tablet 5 mg (has no administration in time range)  warfarin (COUMADIN) tablet 5 mg (has no administration in time range)  warfarin (COUMADIN) tablet 4 mg (has no administration in time range)  pantoprazole (PROTONIX) EC tablet 40 mg (40 mg Oral Given 07/13/17 0255)     ED Discharge Orders    None       Note:  This document was prepared using Dragon voice recognition software and may include unintentional dictation errors.    Hinda Kehr, MD 07/13/17 [phone]

## 2017-07-13 NOTE — ED Notes (Signed)
Report finished att, securing transport 

## 2017-07-13 NOTE — Plan of Care (Signed)
  Problem: Education: Goal: Ability to identify signs and symptoms of gastrointestinal bleeding will improve Outcome: Progressing   Problem: Bowel/Gastric: Goal: Will show no signs and symptoms of gastrointestinal bleeding Outcome: Progressing   Problem: Fluid Volume: Goal: Will show no signs and symptoms of excessive bleeding Outcome: Progressing   Problem: Clinical Measurements: Goal: Complications related to the disease process, condition or treatment will be avoided or minimized Outcome: Progressing   Problem: Education: Goal: Knowledge of General Education information will improve Outcome: Progressing   Problem: Health Behavior/Discharge Planning: Goal: Ability to manage health-related needs will improve Outcome: Progressing   Problem: Clinical Measurements: Goal: Ability to maintain clinical measurements within normal limits will improve Outcome: Progressing Goal: Diagnostic test results will improve Outcome: Progressing Goal: Cardiovascular complication will be avoided Outcome: Progressing   Problem: Safety: Goal: Ability to remain free from injury will improve Outcome: Progressing   Problem: Skin Integrity: Goal: Risk for impaired skin integrity will decrease Outcome: Progressing   Problem: Spiritual Needs Goal: Ability to function at adequate level Outcome: Progressing

## 2017-07-13 NOTE — Consult Note (Signed)
[facility] GI Inpatient Consult Note   Kathline Magic, M.D.  Reason for Consult: Lower GI bleed    Attending Requesting Consult: Fritzi Mandes, M.D.  Outpatient Primary Physician: Tracie Harrier, M.D.  History of Present Illness: Nicole Walton is a 84 y.o. female  Presenting with acute LGI bleed. Patient has hx of tubular adenomatous polyps 01/23/2012 by Dr. Verdie Shire formerly of [facility]. No mention was made of colonic diverticular disease at that time.  Patient noticed having some bout of constipation with bloating 2 days ago. She took an OTC laxative which caused her to have a bowel movement which may have been non-bloody. The patient appears to have urinary frequency and incontinence so she noticed that she stained her nightgown with blood after urinating. She then mentions having "clots in the stool". Patient denies abdominal pain throughout the ordeal. She notices her bowel movements have slowed down and there appears to be less blood noted on the toilet paper after she urinates and pats dry the perianal area. The patient is a Sales promotion account executive witness and in accordance with the religion, refuses any blood products. Past Medical History:  Past Medical History:  Diagnosis Date  . Atrial fibrillation ([place])   . Dysrhythmia    A-fib  . Edema    feet/legs  . Hypothyroidism   . Incontinence   . Shortness of breath dyspnea    with exersion  . Sleep apnea   . Stroke ([place])   . TIA (transient ischemic attack)     Problem List: Patient Active Problem List   Diagnosis Date Noted  . Hematochezia 07/13/2017  . Patient is Jehovah's Witness 07/13/2017  . Atrial fibrillation, chronic ([place]) 07/13/2017  . CVA (cerebral vascular accident) ([place]) 07/13/2017  . Hypothyroidism 07/13/2017  . OSA (obstructive sleep apnea) 07/13/2017  . TIA (transient ischemic attack) 02/25/2016  . UTI (urinary tract infection) 02/25/2016    Past Surgical History: Past Surgical History:  Procedure Laterality  Date  . CATARACT EXTRACTION W/PHACO Right 04/20/2015   Procedure: CATARACT EXTRACTION PHACO AND INTRAOCULAR LENS PLACEMENT (IOC);  Surgeon: Leandrew Koyanagi, MD;  Location: [facility] ORS;  Service: Ophthalmology;  Laterality: Right;  [place]   1:12.1 AP%  16.2 CDE   11.69 fluid cassette lot# [id number]   exp.07/04/2016  . CATARACT EXTRACTION W/PHACO Left 06/01/2015   Procedure: CATARACT EXTRACTION PHACO AND INTRAOCULAR LENS PLACEMENT (IOC);  Surgeon: Leandrew Koyanagi, MD;  Location: [facility] ORS;  Service: Ophthalmology;  Laterality: Left;  [place] 1.17 AP% 9.9 CDE 765 Fluid Pack Lot # [id number]  . CESAREAN SECTION    . CHOLECYSTECTOMY    . COLONOSCOPY    . FOOT SURGERY      Allergies: Allergies  Allergen Reactions  . Ciprofloxacin Other (See Comments)    Not sure of reaction   . Contrast Media [Iodinated Diagnostic Agents] Swelling    Of feet and legs.  (Betadine is OK to use)  . Nitrofurantoin Other (See Comments)    GI distress  . Penicillin G Other (See Comments)    Has patient had a PCN reaction causing immediate rash, facial/tongue/throat swelling, SOB or lightheadedness with hypotension: No Has patient had a PCN reaction causing severe rash involving mucus membranes or skin necrosis: No Has patient had a PCN reaction that required hospitalization: No Has patient had a PCN reaction occurring within the last 10 years: No If all of the above answers are "NO", then may proceed with Cephalosporin use.    Home Medications: Medications Prior to Admission  Medication Sig Dispense Refill Last Dose  . atenolol (TENORMIN) 25 MG tablet Take 1 tablet by mouth daily.   Past Week at 1800  . Calcium-Magnesium-Vitamin D (CALCIUM 500 PO) Take 1 tablet by mouth daily.   Past Week at Unknown time  . clopidogrel (PLAVIX) 75 MG tablet Take 75 mg by mouth daily.   Past Week at Unknown time  . furosemide (LASIX) 20 MG tablet Take 80 mg by mouth daily.    prn at prn  . ketoconazole (NIZORAL) 2 % cream Apply 1  application topically daily.   07/12/2017 at Unknown time  . KRILL OIL PO Take 1 tablet by mouth as needed.   Past Week at 1800  . levothyroxine (SYNTHROID, LEVOTHROID) 50 MCG tablet Take 50 mcg by mouth daily before breakfast.   07/12/2017 at 0800  . Menthol-Zinc Oxide (CALMOSEPTINE) 0.44-20.6 % OINT Apply 1 application topically daily.   07/12/2017 at Unknown time  . nitroGLYCERIN (NITROSTAT) 0.4 MG SL tablet Place 0.4 mg under the tongue every 5 (five) minutes as needed for chest pain.   prn at prn  . oxybutynin (DITROPAN) 5 MG tablet Take 5 mg by mouth 3 (three) times daily.   07/12/2017 at 1200  . triamcinolone cream (KENALOG) 0.1 % Apply 1 application topically 2 (two) times daily.   07/12/2017 at Unknown time  . warfarin (COUMADIN) 4 MG tablet Take 4 mg by mouth every morning. Monday -Saturday   Past Week at Unknown time  . warfarin (COUMADIN) 5 MG tablet Take 5 mg by mouth once a week. Sunday   Past Week at Unknown time  . Cetirizine HCl (ZYRTEC ALLERGY) 10 MG CAPS Take 1 tablet by mouth daily.   Not Taking at Unknown time  . vitamin B-12 (CYANOCOBALAMIN) 500 MCG tablet Take 1 tablet by mouth 2 (two) times daily.   Not Taking at Unknown time   Home medication reconciliation was completed with the patient.   Scheduled Inpatient Medications:   . atenolol  25 mg Oral Daily  . clopidogrel  75 mg Oral Daily  . levothyroxine  50 mcg Oral QAC breakfast  . oxybutynin  5 mg Oral TID  . [START ON 07/14/2017] warfarin  4 mg Oral Once per day on Mon Tue Wed Thu Fri Sat  . warfarin  5 mg Oral Q Sun    Continuous Inpatient Infusions:    PRN Inpatient Medications:  acetaminophen **OR** acetaminophen, bisacodyl, ondansetron **OR** ondansetron (ZOFRAN) IV, senna-docusate  Family History: family history includes Atrial fibrillation in her son; Diabetes in her daughter.   GI Family History: Negative   Social History:   reports that she has never smoked. She has never used smokeless tobacco. She reports  that she does not drink alcohol or use drugs. The patient denies ETOH, tobacco, or drug use.    Review of Systems: Review of Systems - History obtained from the patient General ROS: positive for  - fatigue Psychological ROS: negative ENT ROS: negative Allergy and Immunology ROS: negative Hematological and Lymphatic ROS: negative for - bruising, jaundice, night sweats or swollen lymph nodes Respiratory ROS: no cough, shortness of breath, or wheezing Cardiovascular ROS: no chest pain or dyspnea on exertion Gastrointestinal ROS: as in HPI Genito-Urinary ROS: positive for - nocturia and urinary frequency/urgency Musculoskeletal ROS: negative Neurological ROS: no TIA or stroke symptoms Dermatological ROS: negative  Physical Examination: BP (!) 141/95 (BP Location: Left Arm)   Pulse 64   Temp (!) 97.3 F (36.3 C) (Oral)  Resp 16   Ht 5\' 4"  (1.626 m)   Wt 94.3 kg (208 lb)   SpO2 98%   BMI 35.70 kg/m  Physical Exam  Data: Lab Results  Component Value Date   WBC 5.1 07/12/2017   HGB 13.4 07/12/2017   HCT 39.5 07/12/2017   MCV 96.2 07/12/2017   PLT 216 07/12/2017   Recent Labs  Lab 07/12/17 2217  HGB 13.4   Lab Results  Component Value Date   NA 139 07/12/2017   K 4.3 07/12/2017   CL 105 07/12/2017   CO2 26 07/12/2017   BUN 11 07/12/2017   CREATININE 0.82 07/12/2017   Lab Results  Component Value Date   ALT 13 (L) 07/12/2017   AST 23 07/12/2017   ALKPHOS 80 07/12/2017   BILITOT 0.7 07/12/2017   Recent Labs  Lab 07/13/17 0302  APTT 38*  INR 2.68   CBC Latest Ref Rng & Units 07/12/2017 12/06/2016 07/11/2016  WBC 3.6 - 11.0 K/uL 5.1 6.4 6.4  Hemoglobin 12.0 - 16.0 g/dL 13.4 12.7 14.2  Hematocrit 35.0 - 47.0 % 39.5 37.9 42.4  Platelets 150 - 440 K/uL 216 198 169    STUDIES: No results found. @IMAGES @  Assessment: 1. Lower GI bleed - DDx includes ischemic colitis, diverticular bleed, internal hemorrhoids.   2. Jehovah's witness.  3. Personal hx of colon  polyps.  4. Advanced age.  Recommendations:*  1. Colonoscopy in AM. The patient understands the nature of the planned procedure, indications, risks, alternatives and potential complications including but not limited to bleeding, infection, perforation, damage to internal organs and possible oversedation/side effects from anesthesia. The patient agrees and gives consent to proceed.  Please refer to procedure notes for findings, recommendations and patient disposition/instructions.  Dr. Marius Ditch to perform colonoscopy for GI hospitalist service.  Thank you for the consult. Please call with questions or concerns.  Olean Ree, "Lanny Hurst MD [facility] Gastroenterology [place], [place] [address] [phone]  07/13/2017 12:34 PM

## 2017-07-13 NOTE — H&P (Addendum)
[facility] [place] NAME: Nicole Walton    MR#:  [id number]  DATE OF BIRTH:  1931-05-09  DATE OF ADMISSION:  07/13/2017  PRIMARY CARE PHYSICIAN: Tracie Harrier, MD   REQUESTING/REFERRING PHYSICIAN:  Hinda Kehr, MD   CHIEF COMPLAINT:   Chief Complaint  Patient presents with  . Rectal Bleeding    HISTORY OF PRESENT ILLNESS:  Nicole Walton  is a 84 y.o. female with a known history of chronic Afib (Coumadin), TIA/CVA (Plavix), OSA (CPAP qHS, EPAP unknown) p/w 1-2d Hx rectal bleeding. AAOx3, but she is a vague historian. Lives at home w/ husband. She is a Games developer. States on Friday 06/07 she took an Ex-Lax because she had not had a BM in 2d. States that on Saturday 06/08 she started to have bloody bowel movements, characterized as gross red blood in the toilet bowl. She had multiple such episodes throughout the day, is unsure how many. She finally asked her husband to bring her to the hospital due to persistence of hematochezia. Denies AP or rectal pain. Denies F/C/N/V. Denies fatigue/malaise, generalized weakness, CP/SOB, palpitations, diaphoresis, headache, blurred vision, LH/LOC. No recent ABx or hospitalizations. As noted above, is on blood thinners (Coumadin + Plavix). She does not exhibit pallor. She appears well. She has no other complaints. Believes she had a colonoscopy 2-19yrs ago, believes it was normal, did not receive results. She is not sure who her gastroenterologist was, or which group she saw. Hgb 13.4.  PAST MEDICAL HISTORY:   Past Medical History:  Diagnosis Date  . Atrial fibrillation ([place])   . Dysrhythmia    A-fib  . Edema    feet/legs  . Hypothyroidism   . Incontinence   . Shortness of breath dyspnea    with exersion  . Sleep apnea   . Stroke ([place])   . TIA (transient ischemic attack)     PAST SURGICAL HISTORY:   Past Surgical History:  Procedure Laterality Date  . CATARACT EXTRACTION W/PHACO  Right 04/20/2015   Procedure: CATARACT EXTRACTION PHACO AND INTRAOCULAR LENS PLACEMENT (IOC);  Surgeon: Leandrew Koyanagi, MD;  Location: [facility] ORS;  Service: Ophthalmology;  Laterality: Right;  [place]   1:12.1 AP%  16.2 CDE   11.69 fluid cassette lot# [id number]   exp.07/04/2016  . CATARACT EXTRACTION W/PHACO Left 06/01/2015   Procedure: CATARACT EXTRACTION PHACO AND INTRAOCULAR LENS PLACEMENT (IOC);  Surgeon: Leandrew Koyanagi, MD;  Location: [facility] ORS;  Service: Ophthalmology;  Laterality: Left;  [place] 1.17 AP% 9.9 CDE 765 Fluid Pack Lot # [id number]  . CESAREAN SECTION    . CHOLECYSTECTOMY    . COLONOSCOPY    . FOOT SURGERY      SOCIAL HISTORY:   Social History   Tobacco Use  . Smoking status: Never Smoker  . Smokeless tobacco: Never Used  Substance Use Topics  . Alcohol use: No    FAMILY HISTORY:   Family History  Problem Relation Age of Onset  . Atrial fibrillation Son   . Diabetes Daughter   . Breast cancer Neg Hx     DRUG ALLERGIES:   Allergies  Allergen Reactions  . Ciprofloxacin Other (See Comments)    Not sure of reaction   . Contrast Media [Iodinated Diagnostic Agents] Swelling    Of feet and legs.  (Betadine is OK to use)  . Nitrofurantoin Other (See Comments)    GI distress  . Penicillin G Other (See Comments)    Has patient had a  PCN reaction causing immediate rash, facial/tongue/throat swelling, SOB or lightheadedness with hypotension: No Has patient had a PCN reaction causing severe rash involving mucus membranes or skin necrosis: No Has patient had a PCN reaction that required hospitalization: No Has patient had a PCN reaction occurring within the last 10 years: No If all of the above answers are "NO", then may proceed with Cephalosporin use.    REVIEW OF SYSTEMS:   Review of Systems  Constitutional: Negative for chills, diaphoresis, fever, malaise/fatigue and weight loss.  HENT: Negative for congestion, ear pain, hearing loss, nosebleeds, sinus pain,  sore throat and tinnitus.   Eyes: Negative for blurred vision, double vision and photophobia.  Respiratory: Negative for cough, hemoptysis, sputum production, shortness of breath and wheezing.   Cardiovascular: Negative for chest pain, palpitations, orthopnea, claudication, leg swelling and PND.  Gastrointestinal: Positive for blood in stool and constipation. Negative for abdominal pain, diarrhea, heartburn, melena, nausea and vomiting.  Genitourinary: Negative for dysuria, frequency, hematuria and urgency.  Musculoskeletal: Negative for back pain, joint pain, myalgias and neck pain.  Skin: Negative for itching and rash.  Neurological: Negative for dizziness, tingling, tremors, sensory change, speech change, focal weakness, seizures, loss of consciousness, weakness and headaches.  Psychiatric/Behavioral: Negative for memory loss. The patient does not have insomnia.    MEDICATIONS AT HOME:   Prior to Admission medications   Medication Sig Start Date End Date Taking? Authorizing Provider  atenolol (TENORMIN) 25 MG tablet Take 1 tablet by mouth daily. 11/04/16  Yes [provider]  Calcium-Magnesium-Vitamin D (CALCIUM 500 PO) Take 1 tablet by mouth daily.   Yes [provider]  clopidogrel (PLAVIX) 75 MG tablet Take 75 mg by mouth daily.   Yes [provider]  furosemide (LASIX) 20 MG tablet Take 80 mg by mouth daily.  11/17/16  Yes [provider]  ketoconazole (NIZORAL) 2 % cream Apply 1 application topically daily. 11/07/16  Yes [provider]  KRILL OIL PO Take 1 tablet by mouth as needed.   Yes [provider]  levothyroxine (SYNTHROID, LEVOTHROID) 50 MCG tablet Take 50 mcg by mouth daily before breakfast.   Yes [provider]  Menthol-Zinc Oxide (CALMOSEPTINE) 0.44-20.6 % OINT Apply 1 application topically daily. 11/07/16  Yes [provider]  nitroGLYCERIN (NITROSTAT) 0.4 MG SL tablet Place 0.4 mg under the tongue every  5 (five) minutes as needed for chest pain.   Yes [provider]  oxybutynin (DITROPAN) 5 MG tablet Take 5 mg by mouth 3 (three) times daily.   Yes [provider]  triamcinolone cream (KENALOG) 0.1 % Apply 1 application topically 2 (two) times daily.   Yes [provider]  warfarin (COUMADIN) 4 MG tablet Take 4 mg by mouth every morning. Monday -Saturday   Yes [provider]  warfarin (COUMADIN) 5 MG tablet Take 5 mg by mouth once a week. Sunday   Yes [provider]  Cetirizine HCl (ZYRTEC ALLERGY) 10 MG CAPS Take 1 tablet by mouth daily.    [provider]  vitamin B-12 (CYANOCOBALAMIN) 500 MCG tablet Take 1 tablet by mouth 2 (two) times daily.    [provider]      VITAL SIGNS:  Blood pressure (!) 161/69, pulse 63, temperature 97.8 F (36.6 C), temperature source Oral, resp. rate 18, height 5\' 4"  (1.626 m), weight 94.3 kg (208 lb), SpO2 97 %.  PHYSICAL EXAMINATION:  Physical Exam  Constitutional: She is oriented to person, place, and time. She  appears well-developed and well-nourished. She is active and cooperative.  Non-toxic appearance. She does not have a sickly appearance. She does not appear ill. No distress. She is not intubated.  HENT:  Head: Normocephalic and atraumatic.  Mouth/Throat: Oropharynx is clear and moist. No oropharyngeal exudate.  Eyes: Conjunctivae, EOM and lids are normal. No scleral icterus.  Neck: Neck supple. No JVD present. No thyromegaly present.  Cardiovascular: Normal rate, S1 normal, S2 normal and normal heart sounds. An irregularly irregular rhythm present. Exam reveals no gallop, no S3, no S4, no distant heart sounds and no friction rub.  No murmur heard. Pulmonary/Chest: Effort normal and breath sounds normal. No accessory muscle usage or stridor. No apnea, no tachypnea and no bradypnea. She is not intubated. No respiratory distress. She has no decreased breath sounds. She has no wheezes.  She has no rhonchi. She has no rales.  Abdominal: Soft. Bowel sounds are normal. She exhibits no distension. There is no tenderness. There is no rebound and no guarding.  Musculoskeletal: Normal range of motion. She exhibits no edema or tenderness.  Lymphadenopathy:    She has no cervical adenopathy.  Neurological: She is alert and oriented to person, place, and time. She is not disoriented.  Skin: Skin is warm, dry and intact. No rash noted. She is not diaphoretic. No erythema. No pallor.  Psychiatric: She has a normal mood and affect. Her speech is normal and behavior is normal. Judgment and thought content normal. Cognition and memory are normal.   LABORATORY PANEL:   CBC Recent Labs  Lab 07/12/17 2217  WBC 5.1  HGB 13.4  HCT 39.5  PLT 216   ------------------------------------------------------------------------------------------------------------------  Chemistries  Recent Labs  Lab 07/12/17 2217  NA 139  K 4.3  CL 105  CO2 26  GLUCOSE 112*  BUN 11  CREATININE 0.82  CALCIUM 8.9  AST 23  ALT 13*  ALKPHOS 80  BILITOT 0.7   ------------------------------------------------------------------------------------------------------------------  Cardiac Enzymes No results for input(s): TROPONINI in the last 168 hours. ------------------------------------------------------------------------------------------------------------------  RADIOLOGY:  No results found.  IMPRESSION AND PLAN:   A/P: 24F BRBPR x1-2d, on anticoagulation, Hgb 13.4. Also w/ hyperglycemia. -Admit to Med/Surg -Protonix -Monitor Hgb -Jehova's Witness -Afib, high CHADS-VASC (6+), c/w Coumadin + Plavix -INR pending -GI consult, may need scope -HR WNL -CPAP qHS, EPAP unknown -c/w Atenolol, Levothyroxine, Oxybutynin -NPO -On full-dose anticoagulation -Full code -Admission, > 2 midnights   All the records are reviewed and case discussed with ED provider. Management plans discussed with the  patient, family and they are in agreement.  CODE STATUS: Full code  TOTAL TIME TAKING CARE OF THIS PATIENT: 90 minutes.    Arta Silence M.D on 07/13/2017 at 2:51 AM  Between 7am to 6pm - Pager - [phone]  After 6pm go to [email or web address] - Proofreader  [facility]  Office  [phone]  CC: Primary care physician; Tracie Harrier, MD   Note: This dictation was prepared with Dragon dictation along with smaller phrase technology. Any transcriptional errors that result from this process are unintentional.

## 2017-07-14 ENCOUNTER — Encounter
Admission: EM | Disposition: A | Discharge status: Home / Self Care | Payer: Self-pay | Source: Home / Self Care | Attending: Internal Medicine

## 2017-07-14 ENCOUNTER — Inpatient Hospital Stay: Payer: Medicare HMO | Admitting: Anesthesiology

## 2017-07-14 ENCOUNTER — Encounter: Payer: Self-pay | Admitting: Anesthesiology

## 2017-07-14 DIAGNOSIS — K921 Melena: Principal | ICD-10-CM

## 2017-07-14 DIAGNOSIS — D500 Iron deficiency anemia secondary to blood loss (chronic): Secondary | ICD-10-CM

## 2017-07-14 DIAGNOSIS — Z789 Other specified health status: Secondary | ICD-10-CM

## 2017-07-14 DIAGNOSIS — Z7901 Long term (current) use of anticoagulants: Secondary | ICD-10-CM

## 2017-07-14 DIAGNOSIS — D126 Benign neoplasm of colon, unspecified: Secondary | ICD-10-CM

## 2017-07-14 DIAGNOSIS — K6289 Other specified diseases of anus and rectum: Secondary | ICD-10-CM

## 2017-07-14 HISTORY — PX: COLONOSCOPY WITH PROPOFOL: SHX5780

## 2017-07-14 LAB — CBC
HCT: 36.7 % (ref 35.0–47.0)
HEMOGLOBIN: 12.4 g/dL (ref 12.0–16.0)
MCH: 32.6 pg (ref 26.0–34.0)
MCHC: 33.9 g/dL (ref 32.0–36.0)
MCV: 96.2 fL (ref 80.0–100.0)
Platelets: 195 10*3/uL (ref 150–440)
RBC: 3.81 MIL/uL (ref 3.80–5.20)
RDW: 13.8 % (ref 11.5–14.5)
WBC: 5.2 10*3/uL (ref 3.6–11.0)

## 2017-07-14 SURGERY — COLONOSCOPY WITH PROPOFOL
Anesthesia: General

## 2017-07-14 MED ORDER — PROPOFOL 10 MG/ML IV BOLUS
INTRAVENOUS | Status: AC
  Filled 2017-07-14: qty 20

## 2017-07-14 MED ORDER — PROPOFOL 10 MG/ML IV BOLUS
INTRAVENOUS | Status: AC
Start: 2017-07-14 — End: ?
  Filled 2017-07-14: qty 20

## 2017-07-14 MED ORDER — PROPOFOL 10 MG/ML IV BOLUS
INTRAVENOUS | Status: DC | PRN
  Administered 2017-07-14: 50 mg via INTRAVENOUS

## 2017-07-14 MED ORDER — PROPOFOL 500 MG/50ML IV EMUL
INTRAVENOUS | Status: DC | PRN
  Administered 2017-07-14: 100 ug/kg/min via INTRAVENOUS

## 2017-07-14 MED ORDER — LIDOCAINE HCL (PF) 1 % IJ SOLN
INTRAMUSCULAR | Status: AC
  Administered 2017-07-14: 13:00:00
  Filled 2017-07-14: qty 2

## 2017-07-14 MED ORDER — ENOXAPARIN SODIUM 100 MG/ML ~~LOC~~ SOLN
1.0000 mg/kg | Freq: Two times a day (BID) | SUBCUTANEOUS | Status: DC
  Administered 2017-07-14: 18:00:00 95 mg via SUBCUTANEOUS
  Filled 2017-07-14: qty 1

## 2017-07-14 MED ORDER — ENOXAPARIN SODIUM 100 MG/ML ~~LOC~~ SOLN: 1.0000 mg/kg | Freq: Two times a day (BID) | SUBCUTANEOUS | 1 refills | Status: DC

## 2017-07-14 MED ORDER — LABETALOL HCL 5 MG/ML IV SOLN
INTRAVENOUS | Status: AC
  Filled 2017-07-14: qty 4

## 2017-07-14 MED ORDER — ENOXAPARIN SODIUM 100 MG/ML ~~LOC~~ SOLN
1.0000 mg/kg | Freq: Two times a day (BID) | SUBCUTANEOUS | Status: DC
  Filled 2017-07-14: qty 1

## 2017-07-14 MED ORDER — LABETALOL HCL 5 MG/ML IV SOLN
INTRAVENOUS | Status: DC | PRN
  Administered 2017-07-14: 5 mg via INTRAVENOUS

## 2017-07-14 NOTE — Transfer of Care (Signed)
Immediate Anesthesia Transfer of Care Note  Patient: Nicole Walton  Procedure(s) Performed: COLONOSCOPY WITH PROPOFOL (N/A )  Patient Location: Endoscopy Unit  Anesthesia Type:General  Level of Consciousness: sedated and responds to stimulation  Airway & Oxygen Therapy: Patient Spontanous Breathing and Patient connected to nasal cannula oxygen  Post-op Assessment: Report given to RN and Post -op Vital signs reviewed and stable  Post vital signs: Reviewed and stable  Last Vitals:  Vitals Value Taken Time  BP 125/57 07/14/2017  1:33 PM  Temp    Pulse 80 07/14/2017  1:33 PM  Resp 16 07/14/2017  1:33 PM  SpO2 98 % 07/14/2017  1:33 PM    Last Pain:  Vitals:   07/14/17 0934  TempSrc:   PainSc: 0-No pain         Complications: No apparent anesthesia complications

## 2017-07-14 NOTE — Anesthesia Preprocedure Evaluation (Addendum)
Anesthesia Evaluation  Patient identified by MRN, date of birth, ID band Patient awake    Reviewed: Allergy & Precautions, H&P , NPO status , Patient's Chart, lab work & pertinent test results  History of Anesthesia Complications Negative for: history of anesthetic complications  Airway Mallampati: III  TM Distance: >3 FB Neck ROM: limited    Dental  (+) Poor Dentition, Chipped, Missing, Upper Dentures, Lower Dentures   Pulmonary shortness of breath, sleep apnea and Continuous Positive Airway Pressure Ventilation , neg COPD, neg recent URI,  Pulmonary HTN          Cardiovascular Exercise Tolerance: Good (-) hypertension(-) angina(-) CAD, (-) Past MI, (-) Cardiac Stents, (-) CABG and (-) DOE + dysrhythmias Atrial Fibrillation (-) Valvular Problems/Murmurs     Neuro/Psych neg Seizures TIACVA negative psych ROS   GI/Hepatic negative GI ROS, Neg liver ROS, neg GERD  ,  Endo/Other  neg diabetesHypothyroidism   Renal/GU negative Renal ROS  negative genitourinary   Musculoskeletal   Abdominal   Peds  Hematology negative hematology ROS (+)   Anesthesia Other Findings Past Medical History:   Sleep apnea                                                  Shortness of breath dyspnea                                    Comment:with exersion   Dysrhythmia                                                    Comment:A-fib   Stroke ([facility])                                                 TIA (transient ischemic attack)                              Hypothyroidism                                              Past Surgical History:   CESAREAN SECTION                                              CHOLECYSTECTOMY                                               FOOT SURGERY  BMI    Body Mass Index   38.62 kg/m 2      Reproductive/Obstetrics negative OB ROS                             Anesthesia Physical  Anesthesia Plan  ASA: III  Anesthesia Plan: General   Post-op Pain Management:    Induction: Intravenous  PONV Risk Score and Plan: 3 and Propofol infusion  Airway Management Planned: Nasal Cannula  Additional Equipment:   Intra-op Plan:   Post-operative Plan:   Informed Consent: I have reviewed the patients History and Physical, chart, labs and discussed the procedure including the risks, benefits and alternatives for the proposed anesthesia with the patient or authorized representative who has indicated his/her understanding and acceptance.   Dental Advisory Given  Plan Discussed with: Anesthesiologist, CRNA and Surgeon  Anesthesia Plan Comments:         Anesthesia Quick Evaluation

## 2017-07-14 NOTE — Anesthesia Post-op Follow-up Note (Signed)
Anesthesia QCDR form completed.        

## 2017-07-14 NOTE — Plan of Care (Signed)
  Problem: Education: Goal: Ability to identify signs and symptoms of gastrointestinal bleeding will improve Outcome: Progressing   Problem: Education: Goal: Knowledge of General Education information will improve Outcome: Progressing   Problem: Safety: Goal: Ability to remain free from injury will improve Outcome: Progressing   Problem: Skin Integrity: Goal: Risk for impaired skin integrity will decrease Outcome: Progressing   Problem: Spiritual Needs Goal: Ability to function at adequate level Outcome: Progressing

## 2017-07-14 NOTE — Progress Notes (Signed)
Discharge instructions provided and reviewed with pt., pt spouse and pt. Daughter-in-law. No questions at time of discharge.

## 2017-07-14 NOTE — Anesthesia Postprocedure Evaluation (Signed)
Anesthesia Post Note  Patient: Canda M Hammad  Procedure(s) Performed: COLONOSCOPY WITH PROPOFOL (N/A )  Patient location during evaluation: Endoscopy Anesthesia Type: General Level of consciousness: awake and alert Pain management: pain level controlled Vital Signs Assessment: post-procedure vital signs reviewed and stable Respiratory status: spontaneous breathing, nonlabored ventilation, respiratory function stable and patient connected to nasal cannula oxygen Cardiovascular status: blood pressure returned to baseline and stable Postop Assessment: no apparent nausea or vomiting Anesthetic complications: no     Last Vitals:  Vitals:   07/14/17 1350 07/14/17 1400  BP: 137/84 (!) 145/86  Pulse: 85 80  Resp: (!) 27 18  Temp:    SpO2: 100% 99%    Last Pain:  Vitals:   07/14/17 1400  TempSrc:   PainSc: 0-No pain                 Martha Clan

## 2017-07-14 NOTE — Discharge Summary (Signed)
[facility] NAME: Nicole Walton    MR#:  [id number]  DATE OF BIRTH:  [birth year]  DATE OF ADMISSION:  07/13/2017 ADMITTING PHYSICIAN: Arta Silence, MD  DATE OF DISCHARGE: 07/14/2017 PRIMARY CARE PHYSICIAN: Tracie Harrier, MD    ADMISSION DIAGNOSIS:  Hematochezia [K92.1] Warfarin anticoagulation [Z79.01]  DISCHARGE DIAGNOSIS:  Hematochezia due to bleeding from large polpy in the rectum and being on blood thinners  SECONDARY DIAGNOSIS:   Past Medical History:  Diagnosis Date  . Atrial fibrillation ([place])   . Dysrhythmia    A-fib  . Edema    feet/legs  . Hypothyroidism   . Incontinence   . Shortness of breath dyspnea    with exersion  . Sleep apnea   . Stroke ([place])   . TIA (transient ischemic attack)     HOSPITAL COURSE:  Nicole M Cousinsis a 84 y.o.femalePresenting with acute LGI bleed. Patient has hx of tubular adenomatous polyps 01/23/2012 by Dr. Verdie Shire formerly of [facility]. No mention was made of colonic diverticular disease at that time.  Patient noticed having some bout of constipation with bloating 2 days ago. She took an OTC laxative which caused her to have a bowel movement which may have been non-bloody.  1. BRBPR likely polypoidal bleed --on anticoagulation meds-- warfarin and Plavix have been placed on hold since patient is going to get colonoscopy later this week as outpatient by Dr. Marius Ditch to remove the rectal polyp which is a large one. -Discussed with patient's daughter in the room will start patient on Lovenox 1 mg per kilogram BID subcu to polyps removed. Patient will resume blood thinners once procedure is done as outpatient and okay with G.I. to resume it instructions will be given at discharge at that time. -colonoscopy- Rectal mass 1.0 cm from the anal verge.                       - Likely benign polypoid lesion in the distal rectum.                       - Six 4 to 8 mm polyps in  the entire colon. Resection                        not attempted. - No specimens collected. -hemoglobin stable -patient is Jehovah's Witness  2. chronic a fib  -continue atenolol. Hold Coumadin  now started on Lovenox 1 mg per kilogram BID till colonoscopy is completed hopefully this week  3.hypothyroidism continue Synthroid   4.h/o TIA  5. DVT on coumadin (but holding due to GI bleed)  Patient will discharge later today. Instructions were given and discussed with patient's daughter. Further instructions for colonoscopy will be given by Dr. Marius Ditch.  CONSULTS OBTAINED:  Treatment Team:  Arta Silence, MD Efrain Sella, MD  DRUG ALLERGIES:   Allergies  Allergen Reactions  . Ciprofloxacin Other (See Comments)    Not sure of reaction   . Contrast Media [Iodinated Diagnostic Agents] Swelling    Of feet and legs.  (Betadine is OK to use)  . Nitrofurantoin Other (See Comments)    GI distress  . Penicillin G Other (See Comments)    Has patient had a PCN reaction causing immediate rash, facial/tongue/throat swelling, SOB or lightheadedness with hypotension: No Has patient had a PCN reaction causing severe rash involving mucus membranes or skin  necrosis: No Has patient had a PCN reaction that required hospitalization: No Has patient had a PCN reaction occurring within the last 10 years: No If all of the above answers are "NO", then may proceed with Cephalosporin use.    DISCHARGE MEDICATIONS:   Allergies as of 07/14/2017      Reactions   Ciprofloxacin Other (See Comments)   Not sure of reaction    Contrast Media [iodinated Diagnostic Agents] Swelling   Of feet and legs.  (Betadine is OK to use)   Nitrofurantoin Other (See Comments)   GI distress   Penicillin G Other (See Comments)   Has patient had a PCN reaction causing immediate rash, facial/tongue/throat swelling, SOB or lightheadedness with hypotension: No Has patient had a PCN reaction causing severe rash  involving mucus membranes or skin necrosis: No Has patient had a PCN reaction that required hospitalization: No Has patient had a PCN reaction occurring within the last 10 years: No If all of the above answers are "NO", then may proceed with Cephalosporin use.      Medication List    STOP taking these medications   clopidogrel 75 MG tablet Commonly known as:  PLAVIX   warfarin 4 MG tablet Commonly known as:  COUMADIN   warfarin 5 MG tablet Commonly known as:  COUMADIN     TAKE these medications   atenolol 25 MG tablet Commonly known as:  TENORMIN Take 1 tablet by mouth daily.   CALCIUM 500 PO Take 1 tablet by mouth daily.   CALMOSEPTINE 0.44-20.6 % Oint Generic drug:  Menthol-Zinc Oxide Apply 1 application topically daily.   enoxaparin 100 MG/ML injection Commonly known as:  LOVENOX Inject 0.95 mLs (95 mg total) into the skin every 12 (twelve) hours.   furosemide 20 MG tablet Commonly known as:  LASIX Take 80 mg by mouth daily.   ketoconazole 2 % cream Commonly known as:  NIZORAL Apply 1 application topically daily.   KRILL OIL PO Take 1 tablet by mouth as needed.   levothyroxine 50 MCG tablet Commonly known as:  SYNTHROID, LEVOTHROID Take 50 mcg by mouth daily before breakfast.   nitroGLYCERIN 0.4 MG SL tablet Commonly known as:  NITROSTAT Place 0.4 mg under the tongue every 5 (five) minutes as needed for chest pain.   oxybutynin 5 MG tablet Commonly known as:  DITROPAN Take 5 mg by mouth 3 (three) times daily.   triamcinolone cream 0.1 % Commonly known as:  KENALOG Apply 1 application topically 2 (two) times daily.   vitamin B-12 500 MCG tablet Commonly known as:  CYANOCOBALAMIN Take 1 tablet by mouth 2 (two) times daily.   ZYRTEC ALLERGY 10 MG Caps Generic drug:  Cetirizine HCl Take 1 tablet by mouth daily.       If you experience worsening of your admission symptoms, develop shortness of breath, life threatening emergency, suicidal or  homicidal thoughts you must seek medical attention immediately by calling 911 or calling your MD immediately  if symptoms less severe.  You Must read complete instructions/literature along with all the possible adverse reactions/side effects for all the Medicines you take and that have been prescribed to you. Take any new Medicines after you have completely understood and accept all the possible adverse reactions/side effects.   Please note  You were cared for by a hospitalist during your hospital stay. If you have any questions about your discharge medications or the care you received while you were in the hospital after you are discharged,  you can call the unit and asked to speak with the hospitalist on call if the hospitalist that took care of you is not available. Once you are discharged, your primary care physician will handle any further medical issues. Please note that NO REFILLS for any discharge medications will be authorized once you are discharged, as it is imperative that you return to your primary care physician (or establish a relationship with a primary care physician if you do not have one) for your aftercare needs so that they can reassess your need for medications and monitor your lab values. Today   SUBJECTIVE    Doing well. Tried of doing the prep this  morning VITAL SIGNS:  Blood pressure (!) 145/86, pulse 80, temperature 97.8 F (36.6 C), temperature source Tympanic, resp. rate 18, height 5\' 4"  (1.626 m), weight 94.3 kg (208 lb), SpO2 99 %.  I/O:    Intake/Output Summary (Last 24 hours) at 07/14/2017 1434 Last data filed at 07/14/2017 1329 Gross per 24 hour  Intake 980 ml  Output -  Net 980 ml    PHYSICAL EXAMINATION:  GENERAL:  84 y.o.-year-old patient lying in the bed with no acute distress.  EYES: Pupils equal, round, reactive to light and accommodation. No scleral icterus. Extraocular muscles intact.  HEENT: Head atraumatic, normocephalic. Oropharynx and  nasopharynx clear.  NECK:  Supple, no jugular venous distention. No thyroid enlargement, no tenderness.  LUNGS: Normal breath sounds bilaterally, no wheezing, rales,rhonchi or crepitation. No use of accessory muscles of respiration.  CARDIOVASCULAR: S1, S2 normal. No murmurs, rubs, or gallops.  ABDOMEN: Soft, non-tender, non-distended. Bowel sounds present. No organomegaly or mass.  EXTREMITIES: No pedal edema, cyanosis, or clubbing.  NEUROLOGIC: Cranial nerves II through XII are intact. Muscle strength 5/5 in all extremities. Sensation intact. Gait not checked.  PSYCHIATRIC: The patient is alert and oriented x 3.  SKIN: No obvious rash, lesion, or ulcer.   DATA REVIEW:   CBC  Recent Labs  Lab 07/14/17 0411  WBC 5.2  HGB 12.4  HCT 36.7  PLT 195    Chemistries  Recent Labs  Lab 07/12/17 2217  NA 139  K 4.3  CL 105  CO2 26  GLUCOSE 112*  BUN 11  CREATININE 0.82  CALCIUM 8.9  AST 23  ALT 13*  ALKPHOS 80  BILITOT 0.7    Microbiology Results   No results found for this or any previous visit (from the past 240 hour(s)).  RADIOLOGY:  No results found.   Management plans discussed with the patient, family and they are in agreement.  CODE STATUS:     Code Status Orders  (From admission, onward)        Start     Ordered   07/13/17 0318  Full code  Continuous     07/13/17 0317    Code Status History    Date Active Date Inactive Code Status Order ID Comments User Context   02/25/2016 0533 02/25/2016 1838 Full Code [id number]  Saundra Shelling, MD Inpatient    Advance Directive Documentation     Most Recent Value  Type of Advance Directive  Healthcare Power of [place], Living will  Pre-existing out of facility DNR order (yellow form or pink MOST form)  -  "MOST" Form in Place?  -      TOTAL TIME TAKING CARE OF THIS PATIENT: *40* minutes.    Fritzi Mandes M.D on 07/14/2017 at 2:34 PM  Between 7am to 6pm - Pager - [phone] After  6pm go to [email or web address] -  password [facility]  Office  [phone]  CC: Primary care physician; Tracie Harrier, MD

## 2017-07-14 NOTE — Op Note (Signed)
[facility] Gastroenterology Patient Name: Nicole Walton Procedure Date: 07/14/2017 1:00 PM MRN: [id number] Account #: [id number] Date of Birth: 1931/05/05 Admit Type: Inpatient Age: 84 Room: [facility] ENDO ROOM 2 Gender: Female Note Status: Finalized Procedure:            Colonoscopy Indications:          Hematochezia Providers:            Lin Landsman MD, MD Referring MD:         hande Medicines:            Monitored Anesthesia Care Complications:        No immediate complications. Estimated blood loss:                        Minimal. Procedure:            Pre-Anesthesia Assessment:                       - Prior to the procedure, a History and Physical was                        performed, and patient medications and allergies were                        reviewed. The patient is competent. The risks and                        benefits of the procedure and the sedation options and                        risks were discussed with the patient. All questions                        were answered and informed consent was obtained.                        Patient identification and proposed procedure were                        verified by the physician, the nurse, the                        anesthesiologist, the anesthetist and the technician in                        the pre-procedure area in the procedure room in the                        endoscopy suite. Mental Status Examination: alert and                        oriented. Airway Examination: normal oropharyngeal                        airway and neck mobility. Respiratory Examination:                        clear to auscultation. CV Examination: normal.  Prophylactic Antibiotics: The patient does not require                        prophylactic antibiotics. Prior Anticoagulants: The                        patient has taken Plavix (clopidogrel),and warfarin,                        last  dose was 1 day prior to procedure. ASA Grade                        Assessment: III - A patient with severe systemic                        disease. After reviewing the risks and benefits, the                        patient was deemed in satisfactory condition to undergo                        the procedure. [Anesthesia Plan]. Immediately prior to                        administration of medications, the patient was                        re-assessed for adequacy to receive sedatives. The                        heart rate, respiratory rate, oxygen saturations, blood                        pressure, adequacy of pulmonary ventilation, and                        response to care were monitored throughout the                        procedure. The physical status of the patient was                        re-assessed after the procedure.                       After obtaining informed consent, the colonoscope was                        passed under direct vision. Throughout the procedure,                        the patient's blood pressure, pulse, and oxygen                        saturations were monitored continuously. The                        Colonoscope was introduced through the anus and                        advanced to the  the cecum, identified by appendiceal                        orifice and ileocecal valve. The colonoscopy was                        performed with moderate difficulty due to significant                        looping and the patient's body habitus. Successful                        completion of the procedure was aided by applying                        abdominal pressure. The patient tolerated the procedure                        well. The quality of the bowel preparation was adequate                        to identify polyps 6 mm and larger in size. Findings:      The digital rectal exam revealed a 2 cm (diameter) firm rectal mass       palpated 1.0 cm from the anal  verge.      A 20 mm polypoid lesion was found in the distal rectum. The lesion was       sessile. Oozing was present. Source of rectal bleeding. Polypectomy not       attempted as pt on dual anticoagulation plavix and warfarin, 24hrs prior       to procedure      Six sessile polyps were found in the entire colon. The polyps were 4 to       8 mm in size. Polypectomy was not attempted due to the patient taking       anticoagulation medication.      Retroflexion in rectum showed polyp in distal rectum Impression:           - Rectal mass 1.0 cm from the anal verge.                       - Likely benign polypoid lesion in the distal rectum.                       - Six 4 to 8 mm polyps in the entire colon. Resection                        not attempted.                       - No specimens collected. Recommendation:       - Return patient to hospital ward for possible                        discharge same day.                       - Resume previous diet today.                       - Repeat colonoscopy  in 2 weeks for polypectomy off                        anticoagulation.                       - Return to my office in 1 week. Procedure Code(s):    --- Professional ---                       [phone], Colonoscopy, flexible; diagnostic, including                        collection of specimen(s) by brushing or washing, when                        performed (separate procedure) Diagnosis Code(s):    --- Professional ---                       K62.89, Other specified diseases of anus and rectum                       D49.0, Neoplasm of unspecified behavior of digestive                        system                       D12.6, Benign neoplasm of colon, unspecified                       K92.1, Melena (includes Hematochezia) CPT copyright 2017 American Medical Association. All rights reserved. The codes documented in this report are preliminary and upon coder review may  be revised to meet current  compliance requirements. Dr. Ulyess Mort Lin Landsman MD, MD 07/14/2017 1:35:10 PM This report has been signed electronically. Number of Addenda: 0 Note Initiated On: 07/14/2017 1:00 PM Scope Withdrawal Time: 0 hours 6 minutes 15 seconds  Total Procedure Duration: 0 hours 21 minutes 5 seconds       [facility]

## 2017-07-15 ENCOUNTER — Other Ambulatory Visit: Payer: Self-pay

## 2017-07-15 ENCOUNTER — Encounter: Payer: Self-pay | Admitting: Gastroenterology

## 2017-07-15 DIAGNOSIS — Z86010 Personal history of colonic polyps: Secondary | ICD-10-CM

## 2017-07-16 ENCOUNTER — Encounter: Payer: Self-pay | Admitting: Gastroenterology

## 2017-07-16 ENCOUNTER — Other Ambulatory Visit: Payer: Self-pay

## 2017-07-16 ENCOUNTER — Ambulatory Visit: Payer: Medicare HMO | Admitting: Gastroenterology

## 2017-07-16 VITALS — BP 139/86 | HR 85 | Ht 64.0 in | Wt 211.6 lb

## 2017-07-16 DIAGNOSIS — K625 Hemorrhage of anus and rectum: Secondary | ICD-10-CM

## 2017-07-16 DIAGNOSIS — Z860100 Personal history of colon polyps, unspecified: Secondary | ICD-10-CM

## 2017-07-16 DIAGNOSIS — K621 Rectal polyp: Secondary | ICD-10-CM

## 2017-07-16 NOTE — Progress Notes (Signed)
Cephas Darby, MD [address]  [place]  [place], [place] [address]  Main: [phone]  Fax: [phone]    Gastroenterology Consultation  Referring Provider:     Tracie Harrier, MD Primary Care Physician:  Tracie Harrier, MD Primary Gastroenterologist:  Dr. Cephas Darby Reason for Consultation:     Rectal bleeding,  colon polyps, hospital follow-up        HPI:   Nicole Walton is a 84 y.o. female referred by Dr. Tracie Harrier, MD  for consultation & management of rectal bleeding and several colon polyps in the colon as well as distal rectum. I saw this patient recently in the hospital when she got admitted with intermittent rectal bleeding. Her hemoglobin slightly dropped at that time. She is Jehovah's Witness and has history of A. Fib, stroke for which she is on Coumadin and Plavix. I perform her colonoscopy and found to have an approximately 2 cm polyp in the distal rectum causing rectal bleeding as well as several other polyps in rest of the colon. As she was then given anticoagulation, I did not perform polypectomy. I discussed with patient and her daughter to have repeat colonoscopy for removal of all these polyps off anticoagulation. She was discharged home on Lovenox, off Coumadin and Plavix until the repeat colonoscopy is performed.  She came to the office to discuss about the colonoscopy procedure and anticoagulation. She denies any complaints other than mild episodes of rectal bleeding. She is taking Lovenox shot 2 times daily and tolerating it well  NSAIDs: none  Antiplts/Anticoagulants/Anti thrombotics: Lovenox twice a day  GI Procedures:  Colonoscopy 01/2012 by Dr Candace Cruise Diagnosis:  Part A: DESCENDING COLON POLYP HOT SNARE:  - TUBULAR ADENOMA.  - NEGATIVE FOR HIGH GRADE DYSPLASIA AND MALIGNANCY.  .  Part B: RECTOSIGMOID COLON POLYP HOT SNARE:  - TUBULAR ADENOMA.  - NEGATIVE FOR HIGH GRADE DYSPLASIA AND MALIGNANCY.   Past Medical History:    Diagnosis Date  . Atrial fibrillation ([place])   . Dysrhythmia    A-fib  . Edema    feet/legs  . Hypothyroidism   . Incontinence   . Shortness of breath dyspnea    with exersion  . Sleep apnea   . Stroke ([place])   . TIA (transient ischemic attack)     Past Surgical History:  Procedure Laterality Date  . CATARACT EXTRACTION W/PHACO Right 04/20/2015   Procedure: CATARACT EXTRACTION PHACO AND INTRAOCULAR LENS PLACEMENT (IOC);  Surgeon: Leandrew Koyanagi, MD;  Location: [facility] ORS;  Service: Ophthalmology;  Laterality: Right;  [place]   1:12.1 AP%  16.2 CDE   11.69 fluid cassette lot# [id number]   exp.07/04/2016  . CATARACT EXTRACTION W/PHACO Left 06/01/2015   Procedure: CATARACT EXTRACTION PHACO AND INTRAOCULAR LENS PLACEMENT (IOC);  Surgeon: Leandrew Koyanagi, MD;  Location: [facility] ORS;  Service: Ophthalmology;  Laterality: Left;  [place] 1.17 AP% 9.9 CDE 765 Fluid Pack Lot # [id number]  . CESAREAN SECTION    . CHOLECYSTECTOMY    . COLONOSCOPY    . COLONOSCOPY WITH PROPOFOL N/A 07/14/2017   Procedure: COLONOSCOPY WITH PROPOFOL;  Surgeon: Lin Landsman, MD;  Location: [facility] ENDOSCOPY;  Service: Gastroenterology;  Laterality: N/A;  . FOOT SURGERY      Current Outpatient Medications:  .  atenolol (TENORMIN) 25 MG tablet, Take 1 tablet by mouth daily., Disp: , Rfl:  .  Calcium-Magnesium-Vitamin D (CALCIUM 500 PO), Take 1 tablet by mouth daily., Disp: , Rfl:  .  clopidogrel (PLAVIX) 75 MG  tablet, Take by mouth., Disp: , Rfl:  .  clotrimazole-betamethasone (LOTRISONE) cream, Apply topically., Disp: , Rfl:  .  enoxaparin (LOVENOX) 100 MG/ML injection, Inject 0.95 mLs (95 mg total) into the skin every 12 (twelve) hours., Disp: 16 Syringe, Rfl: 1 .  ketoconazole (NIZORAL) 2 % cream, Apply 1 application topically daily., Disp: , Rfl:  .  KRILL OIL PO, Take 1 tablet by mouth as needed., Disp: , Rfl:  .  levothyroxine (SYNTHROID, LEVOTHROID) 75 MCG tablet, Take by mouth., Disp: , Rfl:  .  Menthol-Zinc  Oxide (CALMOSEPTINE) 0.44-20.6 % OINT, Apply 1 application topically daily., Disp: , Rfl:  .  nitroGLYCERIN (NITROSTAT) 0.4 MG SL tablet, Place 0.4 mg under the tongue every 5 (five) minutes as needed for chest pain., Disp: , Rfl:  .  oxybutynin (DITROPAN) 5 MG tablet, Take 5 mg by mouth 3 (three) times daily., Disp: , Rfl:  .  triamcinolone cream (KENALOG) 0.1 %, Apply 1 application topically 2 (two) times daily., Disp: , Rfl:  .  warfarin (COUMADIN) 4 MG tablet, TAKE 1 TABLET (4MG ) DAILY MONDAY THROUGH FRIDAY. (TAKE 5MG  ON SATURDAY AND SUNDAY AS DIRECTED), Disp: , Rfl:  .  warfarin (COUMADIN) 5 MG tablet, TAKE 1 TABLET AS DIRECTED, Disp: , Rfl:  .  Cetirizine HCl (ZYRTEC ALLERGY) 10 MG CAPS, Take 1 tablet by mouth daily., Disp: , Rfl:  .  furosemide (LASIX) 20 MG tablet, Take 80 mg by mouth daily. , Disp: , Rfl:  .  furosemide (LASIX) 40 MG tablet, , Disp: , Rfl:  .  HYDROcodone-acetaminophen (NORCO/VICODIN) 5-325 MG tablet, Take by mouth., Disp: , Rfl:  .  levothyroxine (SYNTHROID, LEVOTHROID) 50 MCG tablet, Take 50 mcg by mouth daily before breakfast., Disp: , Rfl:  .  vitamin B-12 (CYANOCOBALAMIN) 500 MCG tablet, Take 1 tablet by mouth 2 (two) times daily., Disp: , Rfl:    Family History  Problem Relation Age of Onset  . Atrial fibrillation Son   . Diabetes Daughter   . Breast cancer Neg Hx      Social History   Tobacco Use  . Smoking status: Never Smoker  . Smokeless tobacco: Never Used  Substance Use Topics  . Alcohol use: No  . Drug use: No    Allergies as of 07/16/2017 - Review Complete 07/16/2017  Allergen Reaction Noted  . Ciprofloxacin Other (See Comments) 04/20/2015  . Contrast media [iodinated diagnostic agents] Swelling 04/13/2015  . Iodine Other (See Comments) 04/30/2013  . Nitrofurantoin Other (See Comments) 04/30/2013  . Penicillin g Other (See Comments) 04/30/2013    Review of Systems:    All systems reviewed and negative except where noted in HPI.    Physical Exam:  BP 139/86 (BP Location: Left Arm, Patient Position: Sitting, Cuff Size: Large)   Pulse 85   Ht 5\' 4"  (1.626 m)   Wt 211 lb 9.6 oz (96 kg)   BMI 36.32 kg/m  No LMP recorded. Patient is postmenopausal.  General:   Alert,  Well-developed, well-nourished, pleasant and cooperative in NAD Head:  Normocephalic and atraumatic. Eyes:  Sclera clear, no icterus.   Conjunctiva pink. Ears:  Normal auditory acuity. Nose:  No deformity, discharge, or lesions. Mouth:  No deformity or lesions,oropharynx pink & moist. Neck:  Supple; no masses or thyromegaly. Lungs:  Respirations even and unlabored.  Clear throughout to auscultation.   No wheezes, crackles, or rhonchi. No acute distress. Heart:  Regular rate and irregular rhythm; no murmurs, clicks, rubs, or gallops. Abdomen:  Normal bowel sounds. Soft, obese, non-tender and non-distended without masses, limited abdominal exam due to body habitus, No guarding or rebound tenderness.   Rectal: Not performed Msk:  Symmetrical without gross deformities. Good, equal movement & strength bilaterally. Pulses:  Normal pulses noted. Extremities:  No clubbing or edema.  No cyanosis. Neurologic:  Alert and oriented x3;  grossly normal neurologically. Skin:  Intact without significant lesions or rashes. No jaundice. Psych:  Alert and cooperative. Normal mood and affect.  Imaging Studies: No abdominal imaging  Assessment and Plan:   Nicole Walton is a 84 y.o. Caucasian female with morbid obesity, hypothyroidism, CVA, A. Fib, who is Jehovah's Witness chronically on anticoagulation and antiplatelet therapy with rectal bleeding found to have large rectal polyp and several colon polyps on recent colonoscopy when she was admitted to the hospital. Currently, on Lovenox bridge and off Coumadin and Plavix  - continue Lovenox, last dose tonight - Colonoscopy scheduled for Friday this week - Bowel prep instructions given   Follow up after the  colonoscopy   Cephas Darby, MD

## 2017-07-18 ENCOUNTER — Ambulatory Visit: Payer: Medicare HMO | Admitting: Anesthesiology

## 2017-07-18 ENCOUNTER — Encounter: Payer: Self-pay | Admitting: *Deleted

## 2017-07-18 ENCOUNTER — Ambulatory Visit
Admission: RE | Admit: 2017-07-18 | Discharge: 2017-07-18 | Disposition: A | Discharge status: Home / Self Care | Payer: Medicare HMO | Source: Ambulatory Visit | Attending: Gastroenterology | Admitting: Gastroenterology

## 2017-07-18 ENCOUNTER — Encounter
Admission: RE | Disposition: A | Discharge status: Home / Self Care | Payer: Self-pay | Source: Ambulatory Visit | Attending: Gastroenterology

## 2017-07-18 DIAGNOSIS — Z79899 Other long term (current) drug therapy: Secondary | ICD-10-CM | POA: Diagnosis not present

## 2017-07-18 DIAGNOSIS — Z881 Allergy status to other antibiotic agents status: Secondary | ICD-10-CM | POA: Insufficient documentation

## 2017-07-18 DIAGNOSIS — D128 Benign neoplasm of rectum: Secondary | ICD-10-CM

## 2017-07-18 DIAGNOSIS — Z7901 Long term (current) use of anticoagulants: Secondary | ICD-10-CM | POA: Diagnosis not present

## 2017-07-18 DIAGNOSIS — Z888 Allergy status to other drugs, medicaments and biological substances status: Secondary | ICD-10-CM | POA: Insufficient documentation

## 2017-07-18 DIAGNOSIS — Z880 Allergy status to penicillin: Secondary | ICD-10-CM | POA: Diagnosis not present

## 2017-07-18 DIAGNOSIS — D122 Benign neoplasm of ascending colon: Secondary | ICD-10-CM | POA: Insufficient documentation

## 2017-07-18 DIAGNOSIS — Z86010 Personal history of colonic polyps: Secondary | ICD-10-CM

## 2017-07-18 DIAGNOSIS — D123 Benign neoplasm of transverse colon: Secondary | ICD-10-CM | POA: Diagnosis not present

## 2017-07-18 DIAGNOSIS — I4891 Unspecified atrial fibrillation: Secondary | ICD-10-CM | POA: Diagnosis not present

## 2017-07-18 DIAGNOSIS — C20 Malignant neoplasm of rectum: Secondary | ICD-10-CM | POA: Insufficient documentation

## 2017-07-18 DIAGNOSIS — G4733 Obstructive sleep apnea (adult) (pediatric): Secondary | ICD-10-CM | POA: Diagnosis not present

## 2017-07-18 DIAGNOSIS — Z8673 Personal history of transient ischemic attack (TIA), and cerebral infarction without residual deficits: Secondary | ICD-10-CM | POA: Diagnosis not present

## 2017-07-18 DIAGNOSIS — Z7902 Long term (current) use of antithrombotics/antiplatelets: Secondary | ICD-10-CM | POA: Diagnosis not present

## 2017-07-18 DIAGNOSIS — E039 Hypothyroidism, unspecified: Secondary | ICD-10-CM | POA: Insufficient documentation

## 2017-07-18 DIAGNOSIS — D124 Benign neoplasm of descending colon: Secondary | ICD-10-CM | POA: Diagnosis not present

## 2017-07-18 DIAGNOSIS — D120 Benign neoplasm of cecum: Secondary | ICD-10-CM | POA: Diagnosis not present

## 2017-07-18 DIAGNOSIS — K6389 Other specified diseases of intestine: Secondary | ICD-10-CM | POA: Diagnosis not present

## 2017-07-18 DIAGNOSIS — K635 Polyp of colon: Secondary | ICD-10-CM | POA: Diagnosis not present

## 2017-07-18 DIAGNOSIS — Z91041 Radiographic dye allergy status: Secondary | ICD-10-CM | POA: Diagnosis not present

## 2017-07-18 DIAGNOSIS — K621 Rectal polyp: Secondary | ICD-10-CM | POA: Diagnosis not present

## 2017-07-18 DIAGNOSIS — G4730 Sleep apnea, unspecified: Secondary | ICD-10-CM | POA: Insufficient documentation

## 2017-07-18 HISTORY — PX: COLONOSCOPY WITH PROPOFOL: SHX5780

## 2017-07-18 SURGERY — COLONOSCOPY WITH PROPOFOL
Anesthesia: General

## 2017-07-18 MED ORDER — PHENYLEPHRINE HCL 10 MG/ML IJ SOLN
INTRAMUSCULAR | Status: DC | PRN
  Administered 2017-07-18: 100 ug via INTRAVENOUS

## 2017-07-18 MED ORDER — PROPOFOL 500 MG/50ML IV EMUL
INTRAVENOUS | Status: AC
  Filled 2017-07-18: qty 50

## 2017-07-18 MED ORDER — PROPOFOL 10 MG/ML IV BOLUS
INTRAVENOUS | Status: DC | PRN
  Administered 2017-07-18: 40 mg via INTRAVENOUS

## 2017-07-18 MED ORDER — SODIUM CHLORIDE 0.9 % IV SOLN
INTRAVENOUS | Status: DC
  Administered 2017-07-18: 15:00:00 via INTRAVENOUS
  Administered 2017-07-18: 1000 mL via INTRAVENOUS

## 2017-07-18 MED ORDER — PROPOFOL 500 MG/50ML IV EMUL
INTRAVENOUS | Status: DC | PRN
  Administered 2017-07-18: 150 ug/kg/min via INTRAVENOUS

## 2017-07-18 NOTE — Anesthesia Postprocedure Evaluation (Signed)
Anesthesia Post Note  Patient: Brady M Raneri  Procedure(s) Performed: COLONOSCOPY WITH PROPOFOL (N/A )  Patient location during evaluation: PACU Anesthesia Type: General Level of consciousness: awake and alert Pain management: pain level controlled Vital Signs Assessment: post-procedure vital signs reviewed and stable Respiratory status: spontaneous breathing, nonlabored ventilation, respiratory function stable and patient connected to nasal cannula oxygen Cardiovascular status: blood pressure returned to baseline and stable Postop Assessment: no apparent nausea or vomiting Anesthetic complications: no     Last Vitals:  Vitals:   07/18/17 1625 07/18/17 1627  BP: 125/87 125/67  Pulse: 80 80  Resp: 17 18  Temp: (!) 36.2 C (!) 36.2 C  SpO2: 99% 99%    Last Pain:  Vitals:   07/18/17 1625  TempSrc: Tympanic  PainSc: Asleep                 Molli Barrows

## 2017-07-18 NOTE — Transfer of Care (Signed)
Immediate Anesthesia Transfer of Care Note  Patient: Nicole Walton  Procedure(s) Performed: COLONOSCOPY WITH PROPOFOL (N/A )  Patient Location: PACU  Anesthesia Type:General  Level of Consciousness: sedated  Airway & Oxygen Therapy: Patient Spontanous Breathing and Patient connected to nasal cannula oxygen  Post-op Assessment: Report given to RN and Post -op Vital signs reviewed and stable  Post vital signs: Reviewed and stable  Last Vitals:  Vitals Value Taken Time  BP    Temp    Pulse    Resp    SpO2      Last Pain:  Vitals:   07/18/17 1323  TempSrc: Tympanic  PainSc: 0-No pain         Complications: No apparent anesthesia complications

## 2017-07-18 NOTE — Op Note (Signed)
[facility] Patient Name: Nicole Walton Procedure Date: 07/18/2017 3:16 PM MRN: [id number] Account #: [id number] Date of Birth: January 28, 1932 Admit Type: Outpatient Age: 84 Room: [facility] ENDO ROOM 2 Gender: Female Note Status: Finalized Procedure:            Colonoscopy Indications:          For therapy of colon polyps, For therapy of rectal                        polyps Providers:            Lin Landsman MD, MD Referring MD:         Tracie Harrier, MD (Referring MD) Medicines:            Monitored Anesthesia Care Complications:        No immediate complications. Estimated blood loss:                        Minimal. Procedure:            Pre-Anesthesia Assessment:                       - Prior to the procedure, a History and Physical was                        performed, and patient medications and allergies were                        reviewed. The patient is competent. The risks and                        benefits of the procedure and the sedation options and                        risks were discussed with the patient. All questions                        were answered and informed consent was obtained.                        Patient identification and proposed procedure were                        verified by the physician, the nurse, the                        anesthesiologist, the anesthetist and the technician in                        the pre-procedure area in the procedure room in the                        endoscopy suite. Mental Status Examination: alert and                        oriented. Airway Examination: normal oropharyngeal                        airway and neck mobility. Respiratory Examination:  clear to auscultation. CV Examination: normal.                        Prophylactic Antibiotics: The patient does not require                        prophylactic antibiotics. Prior Anticoagulants: The                 patient has taken Lovenox (enoxaparin), last dose was 1                        day prior to procedure. ASA Grade Assessment: III - A                        patient with severe systemic disease. After reviewing                        the risks and benefits, the patient was deemed in                        satisfactory condition to undergo the procedure. The                        anesthesia plan was to use monitored anesthesia care                        (MAC). Immediately prior to administration of                        medications, the patient was re-assessed for adequacy                        to receive sedatives. The heart rate, respiratory rate,                        oxygen saturations, blood pressure, adequacy of                        pulmonary ventilation, and response to care were                        monitored throughout the procedure. The physical status                        of the patient was re-assessed after the procedure.                       After obtaining informed consent, the colonoscope was                        passed under direct vision. Throughout the procedure,                        the patient's blood pressure, pulse, and oxygen                        saturations were monitored continuously. The                        Colonoscope was introduced through the  anus and                        advanced to the the cecum, identified by appendiceal                        orifice and ileocecal valve. The colonoscopy was                        performed with moderate difficulty due to significant                        looping and the patient's body habitus. Successful                        completion of the procedure was aided by applying                        abdominal pressure. The patient tolerated the procedure                        well. The quality of the bowel preparation was                        evaluated using the BBPS [facility] Bowel  Preparation                        Scale) with scores of: Right Colon = 3, Transverse                        Colon = 3 and Left Colon = 3 (entire mucosa seen well                        with no residual staining, small fragments of stool or                        opaque liquid). The total BBPS score equals 9. Findings:      The digital rectal exam findings include palpable rectal mass. Pertinent       negatives include normal sphincter tone.      Two sessile polyps were found in the cecum. The polyps were diminutive       in size. These polyps were removed with a cold biopsy forceps. Resection       and retrieval were complete.      A 7 mm polyp was found in the ascending colon. The polyp was sessile.       The polyp was removed with a hot snare. Resection and retrieval were       complete.      Two sessile polyps were found in the transverse colon. The polyps were 3       to 5 mm in size. These polyps were removed with a cold snare and       forceps. Resection and retrieval were complete.      A 8 mm polyp was found in the descending colon. The polyp was sessile.       The polyp was removed with a hot snare. Resection and retrieval were       complete.      A 20 mm polyp was found in the rectum.  The polyp was sessile. The polyp       was removed with a hot snare. Resection and retrieval were complete.      The retroflexed view of the distal rectum and anal verge was normal and       showed no anal or rectal abnormalities. Impression:           - Palpable rectal mass found on digital rectal exam.                       - Two diminutive polyps in the cecum, removed with a                        cold biopsy forceps. Resected and retrieved.                       - One 7 mm polyp in the ascending colon, removed with a                        hot snare. Resected and retrieved.                       - Two 3 to 5 mm polyps in the transverse colon, removed                        with a cold  snare. Resected and retrieved.                       - One 8 mm polyp in the descending colon, removed with                        a hot snare. Resected and retrieved.                       - One 20 mm polyp (adenomatous) in the rectum, removed                        with a hot snare. Resected and retrieved.                       - The distal rectum and anal verge are normal on                        retroflexion view. Recommendation:       - Discharge patient to home (with escort).                       - Resume previous diet today.                       - Continue present medications.                       - Resume Coumadin (warfarin) tomorrow, Lovenox                        (enoxaparin) tomorrow and Plavix (clopidogrel) tomorrow                        at prior doses. Refer to managing physician for further  adjustment of therapy.                       - Await pathology results.                       - Repeat colonoscopy date to be determined after                        pending pathology results are reviewed for surveillance                        based on pathology results.                       - Return to my office as previously scheduled. Procedure Code(s):    --- Professional ---                       [phone], Colonoscopy, flexible; with removal of tumor(s),                        polyp(s), or other lesion(s) by snare technique                       45380, 38, Colonoscopy, flexible; with biopsy, single                        or multiple Diagnosis Code(s):    --- Professional ---                       K62.89, Other specified diseases of anus and rectum                       D12.0, Benign neoplasm of cecum                       D12.3, Benign neoplasm of transverse colon (hepatic                        flexure or splenic flexure)                       D12.2, Benign neoplasm of ascending colon                       D12.4, Benign neoplasm of descending colon                        D12.8, Benign neoplasm of rectum                       K63.5, Polyp of colon                       K62.1, Rectal polyp CPT copyright 2017 American Medical Association. All rights reserved. The codes documented in this report are preliminary and upon coder review may  be revised to meet current compliance requirements. Dr. Ulyess Mort Lin Landsman MD, MD 07/18/2017 4:25:42 PM This report has been signed electronically. Number of Addenda: 0 Note Initiated On: 07/18/2017 3:16 PM Scope Withdrawal Time: 0 hours 28 minutes 20 seconds  Total Procedure Duration: 0 hours 36 minutes 2 seconds  [facility]

## 2017-07-18 NOTE — Anesthesia Preprocedure Evaluation (Signed)
Anesthesia Evaluation  Patient identified by MRN, date of birth, ID band Patient awake    Reviewed: Allergy & Precautions, H&P , NPO status , Patient's Chart, lab work & pertinent test results, reviewed documented beta blocker date and time   Airway Mallampati: II   Neck ROM: full    Dental  (+) Poor Dentition, Teeth Intact   Pulmonary neg pulmonary ROS, shortness of breath, sleep apnea ,    Pulmonary exam normal        Cardiovascular Exercise Tolerance: Poor negative cardio ROS Normal cardiovascular exam+ dysrhythmias  Rhythm:regular Rate:Normal     Neuro/Psych TIACVA negative neurological ROS  negative psych ROS   GI/Hepatic negative GI ROS, Neg liver ROS,   Endo/Other  negative endocrine ROSHypothyroidism   Renal/GU negative Renal ROS  negative genitourinary   Musculoskeletal   Abdominal   Peds  Hematology negative hematology ROS (+)   Anesthesia Other Findings Past Medical History: No date: Atrial fibrillation (HCC) No date: Dysrhythmia     Comment:  A-fib No date: Edema     Comment:  feet/legs No date: Hypothyroidism No date: Incontinence No date: Shortness of breath dyspnea     Comment:  with exersion No date: Sleep apnea No date: Stroke [facility]) No date: TIA (transient ischemic attack) Past Surgical History: 04/20/2015: CATARACT EXTRACTION W/PHACO; Right     Comment:  Procedure: CATARACT EXTRACTION PHACO AND INTRAOCULAR               LENS PLACEMENT (IOC);  Surgeon: Leandrew Koyanagi, MD;               Location: [facility] ORS;  Service: Ophthalmology;  Laterality:              Right;  [place]   1:12.1 AP%  16.2 CDE   11.69 fluid               cassette lot# [id number]   exp.07/04/2016 06/01/2015: CATARACT EXTRACTION W/PHACO; Left     Comment:  Procedure: CATARACT EXTRACTION PHACO AND INTRAOCULAR               LENS PLACEMENT (IOC);  Surgeon: Leandrew Koyanagi, MD;               Location: [facility] ORS;  Service:  Ophthalmology;  Laterality:              Left;  [place] 1.17 AP% 9.9 CDE 765 Fluid Pack Lot #               [id number] No date: CESAREAN SECTION No date: CHOLECYSTECTOMY No date: COLONOSCOPY 07/14/2017: COLONOSCOPY WITH PROPOFOL; N/A     Comment:  Procedure: COLONOSCOPY WITH PROPOFOL;  Surgeon: Lin Landsman, MD;  Location: [facility] ENDOSCOPY;  Service:               Gastroenterology;  Laterality: N/A; No date: FOOT SURGERY   Reproductive/Obstetrics negative OB ROS                             Anesthesia Physical Anesthesia Plan  ASA: III  Anesthesia Plan: General   Post-op Pain Management:    Induction:   PONV Risk Score and Plan:   Airway Management Planned:   Additional Equipment:   Intra-op Plan:   Post-operative Plan:   Informed Consent: I have reviewed the patients History and Physical, chart, labs and discussed  the procedure including the risks, benefits and alternatives for the proposed anesthesia with the patient or authorized representative who has indicated his/her understanding and acceptance.   Dental Advisory Given  Plan Discussed with: CRNA  Anesthesia Plan Comments:         Anesthesia Quick Evaluation

## 2017-07-18 NOTE — H&P (Signed)
Cephas Darby, MD [address]  [place]  [place], [place] [address]  Main: [phone]  Fax: [phone] Pager: [phone]  Primary Care Physician:  Tracie Harrier, MD Primary Gastroenterologist:  Dr. Cephas Darby  Pre-Procedure History & Physical: HPI:  Nicole Walton is a 84 y.o. female is here for an colonoscopy.   Past Medical History:  Diagnosis Date  . Atrial fibrillation ([place])   . Dysrhythmia    A-fib  . Edema    feet/legs  . Hypothyroidism   . Incontinence   . Shortness of breath dyspnea    with exersion  . Sleep apnea   . Stroke ([place])   . TIA (transient ischemic attack)     Past Surgical History:  Procedure Laterality Date  . CATARACT EXTRACTION W/PHACO Right 04/20/2015   Procedure: CATARACT EXTRACTION PHACO AND INTRAOCULAR LENS PLACEMENT (IOC);  Surgeon: Leandrew Koyanagi, MD;  Location: [facility] ORS;  Service: Ophthalmology;  Laterality: Right;  [place]   1:12.1 AP%  16.2 CDE   11.69 fluid cassette lot# [id number]   exp.07/04/2016  . CATARACT EXTRACTION W/PHACO Left 06/01/2015   Procedure: CATARACT EXTRACTION PHACO AND INTRAOCULAR LENS PLACEMENT (IOC);  Surgeon: Leandrew Koyanagi, MD;  Location: [facility] ORS;  Service: Ophthalmology;  Laterality: Left;  [place] 1.17 AP% 9.9 CDE 765 Fluid Pack Lot # [id number]  . CESAREAN SECTION    . CHOLECYSTECTOMY    . COLONOSCOPY    . COLONOSCOPY WITH PROPOFOL N/A 07/14/2017   Procedure: COLONOSCOPY WITH PROPOFOL;  Surgeon: Lin Landsman, MD;  Location: [facility] ENDOSCOPY;  Service: Gastroenterology;  Laterality: N/A;  . FOOT SURGERY      Prior to Admission medications   Medication Sig Start Date End Date Taking? Authorizing Provider  atenolol (TENORMIN) 25 MG tablet Take 1 tablet by mouth daily. 11/04/16  Yes [provider]  Calcium-Magnesium-Vitamin D (CALCIUM 500 PO) Take 1 tablet by mouth daily.   Yes [provider]  Cetirizine HCl (ZYRTEC ALLERGY) 10 MG CAPS Take 1 tablet by mouth daily.    Yes [provider]  clopidogrel (PLAVIX) 75 MG tablet Take by mouth. 01/09/17  Yes [provider]  clotrimazole-betamethasone (LOTRISONE) cream Apply topically. 12/06/16 12/06/17 Yes [provider]  enoxaparin (LOVENOX) 100 MG/ML injection Inject 0.95 mLs (95 mg total) into the skin every 12 (twelve) hours. 07/14/17  Yes Fritzi Mandes, MD  furosemide (LASIX) 20 MG tablet Take 80 mg by mouth daily.  11/17/16  Yes [provider]  furosemide (LASIX) 40 MG tablet  06/13/17  Yes [provider]  HYDROcodone-acetaminophen (NORCO/VICODIN) 5-325 MG tablet Take by mouth. 03/08/16  Yes [provider]  ketoconazole (NIZORAL) 2 % cream Apply 1 application topically daily. 11/07/16  Yes [provider]  KRILL OIL PO Take 1 tablet by mouth as needed.   Yes [provider]  levothyroxine (SYNTHROID, LEVOTHROID) 50 MCG tablet Take 50 mcg by mouth daily before breakfast.   Yes [provider]  Menthol-Zinc Oxide (CALMOSEPTINE) 0.44-20.6 % OINT Apply 1 application topically daily. 11/07/16  Yes [provider]  nitroGLYCERIN (NITROSTAT) 0.4 MG SL tablet Place 0.4 mg under the tongue every 5 (five) minutes as needed for chest pain.   Yes [provider]  oxybutynin (DITROPAN) 5 MG tablet Take 5 mg by mouth 3 (three) times daily.   Yes [provider]  triamcinolone cream (KENALOG) 0.1 % Apply 1 application topically 2 (two) times daily.   Yes [provider]  vitamin B-12 (CYANOCOBALAMIN)  500 MCG tablet Take 1 tablet by mouth 2 (two) times daily.   Yes [provider]  warfarin (COUMADIN) 4 MG tablet TAKE 1 TABLET (4MG ) DAILY MONDAY THROUGH FRIDAY. (TAKE 5MG  ON SATURDAY AND SUNDAY AS DIRECTED) 04/22/17  Yes [provider]  warfarin (COUMADIN) 5 MG tablet TAKE 1 TABLET AS DIRECTED 05/06/17  Yes [provider]  levothyroxine (SYNTHROID, LEVOTHROID) 75 MCG tablet Take by mouth. 04/02/17  04/02/18  [provider]    Allergies as of 07/15/2017 - Review Complete 07/14/2017  Allergen Reaction Noted  . Ciprofloxacin Other (See Comments) 04/20/2015  . Contrast media [iodinated diagnostic agents] Swelling 04/13/2015  . Nitrofurantoin Other (See Comments) 04/30/2013  . Penicillin g Other (See Comments) 04/30/2013    Family History  Problem Relation Age of Onset  . Atrial fibrillation Son   . Diabetes Daughter   . Breast cancer Neg Hx     Social History   Socioeconomic History  . Marital status: Married    Spouse name: Not on file  . Number of children: Not on file  . Years of education: Not on file  . Highest education level: Not on file  Occupational History  . Occupation: retired  Scientific laboratory technician  . Financial resource strain: Not hard at all  . Food insecurity:    Worry: Never true    Inability: Never true  . Transportation needs:    Medical: No    Non-medical: No  Tobacco Use  . Smoking status: Never Smoker  . Smokeless tobacco: Never Used  Substance and Sexual Activity  . Alcohol use: No  . Drug use: No  . Sexual activity: Not on file  Lifestyle  . Physical activity:    Days per week: 5 days    Minutes per session: 20 min  . Stress: Not at all  Relationships  . Social connections:    Talks on phone: Three times a week    Gets together: Three times a week    Attends religious service: 1 to 4 times per year    Active member of club or organization: No    Attends meetings of clubs or organizations: Never    Relationship status: Married  . Intimate partner violence:    Fear of current or ex partner: No    Emotionally abused: No    Physically abused: No    Forced sexual activity: No  Other Topics Concern  . Not on file  Social History Narrative   Live at home with spouse, do regular yard work daily    Review of Systems: See HPI, otherwise negative ROS  Physical Exam: BP (!) 150/70   Pulse 95   Temp (!) 96.8 F (36 C) (Tympanic)    Resp 18   SpO2 98%  General:   Alert,  pleasant and cooperative in NAD Head:  Normocephalic and atraumatic. Neck:  Supple; no masses or thyromegaly. Lungs:  Clear throughout to auscultation.    Heart:  Regular rate and rhythm. Abdomen:  Soft, nontender and nondistended. Normal bowel sounds, without guarding, and without rebound.   Neurologic:  Alert and  oriented x4;  grossly normal neurologically.  Impression/Plan: ZENYA HICKAM is here for an colonoscopy to be performed for colon polyps removal  Risks, benefits, limitations, and alternatives regarding  colonoscopy have been reviewed with the patient.  Questions have been answered.  All parties agreeable.   Sherri Sear, MD  07/18/2017, 2:21 PM

## 2017-07-18 NOTE — Anesthesia Post-op Follow-up Note (Signed)
Anesthesia QCDR form completed.        

## 2017-07-21 ENCOUNTER — Other Ambulatory Visit: Payer: Self-pay

## 2017-07-21 DIAGNOSIS — G4733 Obstructive sleep apnea (adult) (pediatric): Secondary | ICD-10-CM | POA: Diagnosis not present

## 2017-07-21 DIAGNOSIS — Z6841 Body Mass Index (BMI) 40.0 and over, adult: Secondary | ICD-10-CM | POA: Diagnosis not present

## 2017-07-21 DIAGNOSIS — Z7901 Long term (current) use of anticoagulants: Secondary | ICD-10-CM | POA: Diagnosis not present

## 2017-07-21 DIAGNOSIS — R001 Bradycardia, unspecified: Secondary | ICD-10-CM | POA: Diagnosis not present

## 2017-07-21 DIAGNOSIS — Z8719 Personal history of other diseases of the digestive system: Secondary | ICD-10-CM

## 2017-07-21 DIAGNOSIS — I4891 Unspecified atrial fibrillation: Secondary | ICD-10-CM | POA: Diagnosis not present

## 2017-07-21 DIAGNOSIS — Z9889 Other specified postprocedural states: Principal | ICD-10-CM

## 2017-07-21 DIAGNOSIS — E039 Hypothyroidism, unspecified: Secondary | ICD-10-CM | POA: Diagnosis not present

## 2017-07-23 ENCOUNTER — Telehealth: Payer: Self-pay | Admitting: Gastroenterology

## 2017-07-23 ENCOUNTER — Encounter: Payer: Self-pay | Admitting: Gastroenterology

## 2017-07-23 LAB — SURGICAL PATHOLOGY

## 2017-07-23 NOTE — Telephone Encounter (Signed)
Discussed pathology results from recent colonoscopy with patient's daughter. 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 The mucosal margins cannot be reliably assessed, there were no tumor cells at the cauterized deep margin.  Recommend rectal EUS and oncology referral based on the EUS results  Patient's daughter expressed understanding of the plan who will convey the results the patient  Cephas Darby, MD [place]  [place], [place] [address]  Main: [phone]  Fax: [phone] Pager: [phone]

## 2017-07-28 ENCOUNTER — Other Ambulatory Visit: Payer: Self-pay

## 2017-07-28 DIAGNOSIS — C20 Malignant neoplasm of rectum: Secondary | ICD-10-CM

## 2017-07-29 ENCOUNTER — Encounter: Admission: RE | Payer: Self-pay | Source: Ambulatory Visit

## 2017-07-29 ENCOUNTER — Ambulatory Visit: Admission: RE | Admit: 2017-07-29 | Payer: Medicare HMO | Source: Ambulatory Visit | Admitting: Gastroenterology

## 2017-07-29 SURGERY — COLONOSCOPY WITH PROPOFOL
Anesthesia: General

## 2017-07-31 DIAGNOSIS — G4733 Obstructive sleep apnea (adult) (pediatric): Secondary | ICD-10-CM | POA: Diagnosis not present

## 2017-07-31 DIAGNOSIS — E039 Hypothyroidism, unspecified: Secondary | ICD-10-CM | POA: Diagnosis not present

## 2017-07-31 DIAGNOSIS — Z6841 Body Mass Index (BMI) 40.0 and over, adult: Secondary | ICD-10-CM | POA: Diagnosis not present

## 2017-07-31 DIAGNOSIS — R21 Rash and other nonspecific skin eruption: Secondary | ICD-10-CM | POA: Diagnosis not present

## 2017-07-31 DIAGNOSIS — R8290 Unspecified abnormal findings in urine: Secondary | ICD-10-CM | POA: Diagnosis not present

## 2017-07-31 DIAGNOSIS — I4891 Unspecified atrial fibrillation: Secondary | ICD-10-CM | POA: Diagnosis not present

## 2017-07-31 DIAGNOSIS — Z09 Encounter for follow-up examination after completed treatment for conditions other than malignant neoplasm: Secondary | ICD-10-CM | POA: Diagnosis not present

## 2017-07-31 DIAGNOSIS — R001 Bradycardia, unspecified: Secondary | ICD-10-CM | POA: Diagnosis not present

## 2017-07-31 DIAGNOSIS — C20 Malignant neoplasm of rectum: Secondary | ICD-10-CM | POA: Diagnosis not present

## 2017-08-01 ENCOUNTER — Telehealth: Payer: Self-pay | Admitting: Gastroenterology

## 2017-08-01 NOTE — Telephone Encounter (Signed)
Spoke with [name] and confirmed referral

## 2017-08-01 NOTE — Telephone Encounter (Signed)
Denise left vm regards to pt  She states the referral to cancer center has not been send  Please call [name] at [phone]

## 2017-08-06 DIAGNOSIS — R791 Abnormal coagulation profile: Secondary | ICD-10-CM | POA: Diagnosis not present

## 2017-08-11 ENCOUNTER — Telehealth: Payer: Self-pay | Admitting: Gastroenterology

## 2017-08-11 NOTE — Telephone Encounter (Signed)
Melissa from [facility] left vm in regards to pt she states pt keeps calling stating pt should have referral send to [facility] please call her at [phone]

## 2017-08-12 ENCOUNTER — Ambulatory Visit
Admission: RE | Admit: 2017-08-12 | Discharge: 2017-08-12 | Disposition: A | Discharge status: Home / Self Care | Payer: Medicare HMO | Source: Ambulatory Visit | Attending: Gastroenterology | Admitting: Gastroenterology

## 2017-08-12 DIAGNOSIS — C20 Malignant neoplasm of rectum: Secondary | ICD-10-CM | POA: Insufficient documentation

## 2017-08-12 MED ORDER — GADOBENATE DIMEGLUMINE 529 MG/ML IV SOLN
20.0000 mL | Freq: Once | INTRAVENOUS | Status: AC | PRN
  Administered 2017-08-12: 20 mL via INTRAVENOUS

## 2017-08-13 ENCOUNTER — Other Ambulatory Visit: Payer: Self-pay

## 2017-08-14 ENCOUNTER — Telehealth: Payer: Self-pay

## 2017-08-14 ENCOUNTER — Other Ambulatory Visit: Payer: Self-pay

## 2017-08-14 NOTE — Telephone Encounter (Signed)
LVM for Nicole Walton to return call

## 2017-08-14 NOTE — Telephone Encounter (Signed)
Called and spoke with Ms. Labrosse to arrange EUS. Offered to have EUS arranged in [place] for earlier date. She refused stating she did not want to have it performed out of town. EUS scheduled for 09/11/17 with Dr. Mont Dutton. Went over instructions and prep. Copy of both mailed to home address along with my contact number for any future questions/needs. Permission to hold Plavix and Coumadin sent to Dr. Linton Ham office. We will contact her regarding his response.  INSTRUCTIONS FOR ENDOSCOPIC ULTRASOUND     -Your procedure has been scheduled for August 8th with Dr. Mont Dutton at [facility].   -The hospital will contact you to pre-register over the phone.    -To get your scheduled arrival time, please call the Endoscopy unit at  [phone] between 1-3pm on: August 7th    - Instructions for Miralax-Gatorade Bowel Preparation are enclosed. To ensure a successful exam, please follow the instructions carefully.   -Begin your bowel prep as instructed on August 7th      -ON THE DAY OF YOU PROCEDURE:           1.  You may take your heart, seizure, blood pressure, Parkinson's or breathing              medications at 6am with just enough water to get your pills down.         2.  Do not take any oral Diabetic medications the morning of your procedure.         3.  If you are a diabetic and are using insulin, please notify your prescribing                 physician of this procedure as your dose may need to be altered for this                 procedure.                4. Do not take Vitamin E or fish oil for 7 days prior to exam.                -On the day of your procedure, come to the [facility] Admitting/Registration desk (First desk on the right) at the scheduled arrival time.   You MUST have someone drive you home from your procedure. You must have a responsible adult with a valid driver's license who is on site throughout your entire procedure and who can stay with you for  several hours after your procedure. You may not go home alone in a taxi, shuttle [place] or bus, as the drivers will not be responsible for you.   --If you have any questions please call me at the above contact    Oncology Nurse Navigator Documentation  Navigator Location: [facility] (08/14/17 1600)   )Navigator Encounter Type: Telephone (08/14/17 1600) Telephone: Outgoing Call;Education (08/14/17 1600)                       Barriers/Navigation Needs: Coordination of Care (08/14/17 1600)   Interventions: Coordination of Care (08/14/17 1600)   Coordination of Care: EUS (08/14/17 1600)                  Time Spent with Patient: 30 (08/14/17 1600)

## 2017-08-14 NOTE — Telephone Encounter (Signed)
Ok Thank you I will notify Dr. Marius Ditch   Thank You,  Barnie Alderman

## 2017-08-18 ENCOUNTER — Other Ambulatory Visit: Payer: Self-pay

## 2017-08-18 ENCOUNTER — Telehealth: Payer: Self-pay

## 2017-08-18 NOTE — Telephone Encounter (Signed)
Permission received from Dr. Ginette Pitman to hold Coumadin and Plavix for 5 days prior to EUS. Called and instructed Ms. Raudenbush to take medications on 09/05/17 then stop for procedure. She verbalized understanding with read back and copy of clearance mailed to home address. Oncology Nurse Navigator Documentation  Navigator Location: [facility] (08/18/17 1100)   )Navigator Encounter Type: Telephone (08/18/17 1100) Telephone: Outgoing Call;Education (08/18/17 1100)                               Coordination of Care: EUS (08/18/17 1100)                  Time Spent with Patient: 15 (08/18/17 1100)

## 2017-08-26 DIAGNOSIS — I4891 Unspecified atrial fibrillation: Secondary | ICD-10-CM | POA: Diagnosis not present

## 2017-08-26 DIAGNOSIS — C20 Malignant neoplasm of rectum: Secondary | ICD-10-CM | POA: Diagnosis not present

## 2017-08-26 DIAGNOSIS — Z8673 Personal history of transient ischemic attack (TIA), and cerebral infarction without residual deficits: Secondary | ICD-10-CM | POA: Diagnosis not present

## 2017-08-26 DIAGNOSIS — Z6841 Body Mass Index (BMI) 40.0 and over, adult: Secondary | ICD-10-CM | POA: Diagnosis not present

## 2017-08-26 DIAGNOSIS — G4733 Obstructive sleep apnea (adult) (pediatric): Secondary | ICD-10-CM | POA: Diagnosis not present

## 2017-08-26 DIAGNOSIS — Z7901 Long term (current) use of anticoagulants: Secondary | ICD-10-CM | POA: Diagnosis not present

## 2017-08-26 DIAGNOSIS — E039 Hypothyroidism, unspecified: Secondary | ICD-10-CM | POA: Diagnosis not present

## 2017-08-30 ENCOUNTER — Other Ambulatory Visit: Payer: Self-pay

## 2017-08-30 DIAGNOSIS — Z79899 Other long term (current) drug therapy: Secondary | ICD-10-CM | POA: Insufficient documentation

## 2017-08-30 DIAGNOSIS — M1711 Unilateral primary osteoarthritis, right knee: Secondary | ICD-10-CM | POA: Insufficient documentation

## 2017-08-30 DIAGNOSIS — Z7901 Long term (current) use of anticoagulants: Secondary | ICD-10-CM | POA: Insufficient documentation

## 2017-08-30 DIAGNOSIS — M25461 Effusion, right knee: Secondary | ICD-10-CM | POA: Insufficient documentation

## 2017-08-30 DIAGNOSIS — E039 Hypothyroidism, unspecified: Secondary | ICD-10-CM | POA: Diagnosis not present

## 2017-08-30 DIAGNOSIS — M25561 Pain in right knee: Secondary | ICD-10-CM | POA: Diagnosis not present

## 2017-08-30 NOTE — ED Triage Notes (Signed)
Pt states right knee pain since last pm. Pt denies known injury. Pt states she is unable to bear weight onknee, no redness or increased warmth noted on palpation.

## 2017-08-31 ENCOUNTER — Emergency Department
Admission: EM | Admit: 2017-08-31 | Discharge: 2017-08-31 | Disposition: A | Discharge status: Home / Self Care | Payer: Medicare HMO | Attending: Emergency Medicine | Admitting: Emergency Medicine

## 2017-08-31 ENCOUNTER — Emergency Department: Payer: Medicare HMO

## 2017-08-31 DIAGNOSIS — M1711 Unilateral primary osteoarthritis, right knee: Secondary | ICD-10-CM

## 2017-08-31 DIAGNOSIS — M25561 Pain in right knee: Secondary | ICD-10-CM | POA: Diagnosis not present

## 2017-08-31 DIAGNOSIS — M25461 Effusion, right knee: Secondary | ICD-10-CM

## 2017-08-31 MED ORDER — OXYCODONE-ACETAMINOPHEN 5-325 MG PO TABS
1.0000 | ORAL_TABLET | Freq: Once | ORAL | Status: AC
  Administered 2017-08-31: 1 via ORAL

## 2017-08-31 MED ORDER — OXYCODONE-ACETAMINOPHEN 5-325 MG PO TABS: 1.0000 | ORAL_TABLET | ORAL | 0 refills | Status: AC | PRN

## 2017-08-31 MED ORDER — OXYCODONE-ACETAMINOPHEN 5-325 MG PO TABS
ORAL_TABLET | ORAL | Status: AC
  Filled 2017-08-31: qty 1

## 2017-08-31 NOTE — ED Notes (Signed)
Patient transported to MRI 

## 2017-08-31 NOTE — ED Provider Notes (Signed)
[facility] Emergency Department Provider Note    First MD Initiated Contact with Patient 08/31/17 [phone]     (approximate)  I have reviewed the triage vital signs and the nursing notes.   HISTORY  Chief Complaint Knee Pain    HPI Nicole Walton is a 84 y.o. female with below list of chronic medical conditions presents to the emergency department with nontraumatic right knee pain.  Patient states that she has been getting up frequently to go to the bathroom secondary to taking a "water pill" patient states that she is rushing and believe that she may have injured her knee doing so.  Patient states current pain score is 5 out of 10 however with any movement dramatically increases.   Past Medical History:  Diagnosis Date  . Atrial fibrillation ([place])   . Dysrhythmia    A-fib  . Edema    feet/legs  . Hypothyroidism   . Incontinence   . Shortness of breath dyspnea    with exersion  . Sleep apnea   . Stroke ([place])   . TIA (transient ischemic attack)     Patient Active Problem List   Diagnosis Date Noted  . Hx of colonic polyps   . Hematochezia 07/13/2017  . Patient is Jehovah's Witness 07/13/2017  . Atrial fibrillation, chronic ([place]) 07/13/2017  . CVA (cerebral vascular accident) ([place]) 07/13/2017  . Hypothyroidism 07/13/2017  . OSA (obstructive sleep apnea) 07/13/2017  . TIA (transient ischemic attack) 02/25/2016  . UTI (urinary tract infection) 02/25/2016    Past Surgical History:  Procedure Laterality Date  . CATARACT EXTRACTION W/PHACO Right 04/20/2015   Procedure: CATARACT EXTRACTION PHACO AND INTRAOCULAR LENS PLACEMENT (IOC);  Surgeon: Leandrew Koyanagi, MD;  Location: [facility] ORS;  Service: Ophthalmology;  Laterality: Right;  [place]   1:12.1 AP%  16.2 CDE   11.69 fluid cassette lot# [id number]   exp.07/04/2016  . CATARACT EXTRACTION W/PHACO Left 06/01/2015   Procedure: CATARACT EXTRACTION PHACO AND INTRAOCULAR LENS PLACEMENT (IOC);  Surgeon:  Leandrew Koyanagi, MD;  Location: [facility] ORS;  Service: Ophthalmology;  Laterality: Left;  [place] 1.17 AP% 9.9 CDE 765 Fluid Pack Lot # [id number]  . CESAREAN SECTION    . CHOLECYSTECTOMY    . COLONOSCOPY    . COLONOSCOPY WITH PROPOFOL N/A 07/14/2017   Procedure: COLONOSCOPY WITH PROPOFOL;  Surgeon: Lin Landsman, MD;  Location: [facility] ENDOSCOPY;  Service: Gastroenterology;  Laterality: N/A;  . COLONOSCOPY WITH PROPOFOL N/A 07/18/2017   Procedure: COLONOSCOPY WITH PROPOFOL;  Surgeon: Lin Landsman, MD;  Location: [facility] ENDOSCOPY;  Service: Gastroenterology;  Laterality: N/A;  . FOOT SURGERY      Prior to Admission medications   Medication Sig Start Date End Date Taking? Authorizing Provider  atenolol (TENORMIN) 25 MG tablet Take 1 tablet by mouth daily. 11/04/16   [provider]  Calcium-Magnesium-Vitamin D (CALCIUM 500 PO) Take 1 tablet by mouth daily.    [provider]  Cetirizine HCl (ZYRTEC ALLERGY) 10 MG CAPS Take 1 tablet by mouth daily.    [provider]  clopidogrel (PLAVIX) 75 MG tablet Take by mouth. 01/09/17   [provider]  clotrimazole-betamethasone (LOTRISONE) cream Apply topically. 12/06/16 12/06/17  [provider]  enoxaparin (LOVENOX) 100 MG/ML injection Inject 0.95 mLs (95 mg total) into the skin every 12 (twelve) hours. 07/14/17   Fritzi Mandes, MD  furosemide (LASIX) 20 MG tablet Take 80 mg by mouth daily.  11/17/16   [provider]  furosemide (LASIX) 40  MG tablet  06/13/17   [provider]  HYDROcodone-acetaminophen (NORCO/VICODIN) 5-325 MG tablet Take by mouth. 03/08/16   [provider]  ketoconazole (NIZORAL) 2 % cream Apply 1 application topically daily. 11/07/16   [provider]  KRILL OIL PO Take 1 tablet by mouth as needed.    [provider]  levothyroxine (SYNTHROID, LEVOTHROID) 50 MCG tablet Take 50 mcg by mouth daily before breakfast.    [provider]    levothyroxine (SYNTHROID, LEVOTHROID) 75 MCG tablet Take by mouth. 04/02/17 04/02/18  [provider]  Menthol-Zinc Oxide (CALMOSEPTINE) 0.44-20.6 % OINT Apply 1 application topically daily. 11/07/16   [provider]  nitroGLYCERIN (NITROSTAT) 0.4 MG SL tablet Place 0.4 mg under the tongue every 5 (five) minutes as needed for chest pain.    [provider]  oxybutynin (DITROPAN) 5 MG tablet Take 5 mg by mouth 3 (three) times daily.    [provider]  oxyCODONE-acetaminophen (PERCOCET) 5-325 MG tablet Take 1 tablet by mouth every 4 (four) hours as needed for severe pain. 08/31/17 08/31/18  Gregor Hams, MD  triamcinolone cream (KENALOG) 0.1 % Apply 1 application topically 2 (two) times daily.    [provider]  vitamin B-12 (CYANOCOBALAMIN) 500 MCG tablet Take 1 tablet by mouth 2 (two) times daily.    [provider]  warfarin (COUMADIN) 4 MG tablet TAKE 1 TABLET (4MG ) DAILY MONDAY THROUGH FRIDAY. (TAKE 5MG  ON SATURDAY AND SUNDAY AS DIRECTED) 04/22/17   [provider]  warfarin (COUMADIN) 5 MG tablet TAKE 1 TABLET AS DIRECTED 05/06/17   [provider]    Allergies Ciprofloxacin; Contrast media [iodinated diagnostic agents]; Iodine; Nitrofurantoin; and Penicillin g  Family History  Problem Relation Age of Onset  . Atrial fibrillation Son   . Diabetes Daughter   . Breast cancer Neg Hx     Social History Social History   Tobacco Use  . Smoking status: Never Smoker  . Smokeless tobacco: Never Used  Substance Use Topics  . Alcohol use: No  . Drug use: No    Review of Systems Constitutional: No fever/chills Eyes: No visual changes. ENT: No sore throat. Cardiovascular: Denies chest pain. Respiratory: Denies shortness of breath. Gastrointestinal: No abdominal pain.  No nausea, no vomiting.  No diarrhea.  No constipation. Genitourinary: Negative for dysuria. Musculoskeletal: Negative for neck pain.  Negative for  back pain.  Positive for right knee pain Integumentary: Negative for rash. Neurological: Negative for headaches, focal weakness or numbness.   ____________________________________________   PHYSICAL EXAM:  VITAL SIGNS: ED Triage Vitals  Enc Vitals Group     BP 08/30/17 2044 (!) 129/104     Pulse Rate 08/30/17 2044 69     Resp 08/30/17 2044 16     Temp 08/30/17 2044 97.9 F (36.6 C)     Temp Source 08/30/17 2044 Oral     SpO2 08/30/17 2044 96 %     Weight 08/30/17 2045 93.4 kg (206 lb)     Height 08/30/17 2045 1.6 m (5\' 3" )     Head Circumference --      Peak Flow --      Pain Score 08/30/17 2044 10     Pain Loc --      Pain Edu? --      Excl. in [place]? --     Constitutional: Alert and oriented. Well appearing and in no acute distress. Eyes: Conjunctivae are normal. PERRL. EOMI. Head: Atraumatic. Mouth/Throat: Mucous membranes  are moist.  Oropharynx non-erythematous. Neck: No stridor.   Cardiovascular: Normal rate, regular rhythm. Good peripheral circulation. Grossly normal heart sounds. Respiratory: Normal respiratory effort.  No retractions. Lungs CTAB. Gastrointestinal: Soft and nontender. No distention.  Musculoskeletal: Pain to palpation of the right knee.  Pain with active and passive range of motion.  Pain with valgus/varus stress testing. Neurologic:  Normal speech and language. No gross focal neurologic deficits are appreciated.  Skin:  Skin is warm, dry and intact. No rash noted. Psychiatric: Mood and affect are normal. Speech and behavior are normal.  ____________________________________________   RADIOLOGY I, [place] N BROWN, personally viewed and evaluated these images (plain radiographs) as part of my medical decision making, as well as reviewing the written report by the radiologist.  ED MD interpretation: Moderate osteoarthritis and suspected small joint effusion noted on x-ray.  MRI revealed medial and lateral cartilage obstruction with underlying bone  edema.  Official radiology report(s): Dg Knee Complete 4 Views Right  Result Date: 08/31/2017 CLINICAL DATA:  Right knee pain and swelling.  No known injury. EXAM: RIGHT KNEE - COMPLETE 4+ VIEW COMPARISON:  None. FINDINGS: Moderate medial tibiofemoral joint space loss and peripheral osteophytes. Mild patellofemoral osteophytes. There is spurring of the tibial spines. Suspect small suprapatellar joint effusion. No fracture or bony destructive change. Soft tissue calcifications in the medial leg likely vascular. IMPRESSION: Moderate osteoarthritis and suspected small joint effusion. No acute osseous abnormalities. Electronically Signed   By: Jeb Levering M.D.   On: 08/31/2017 01:50      Procedures   ____________________________________________   INITIAL IMPRESSION / ASSESSMENT AND PLAN / ED COURSE  As part of my medical decision making, I reviewed the following data within the electronic MEDICAL RECORD NUMBER   84 year old female presenting with above-stated history and physical exam of nontraumatic right knee pain.  Concern for possible ligamentous injury versus osteoarthritis and as such x-ray was performed.  He was further evaluated with an MRI which revealed above findings of cartilaginous destruction with bone edema.  No ligamentous injury noted.  Patient given Percocet in the emergency department with improvement of pain patient given a walker knee immobilizer applied.  Patient advised to follow-up with Dr. Marry Guan orthopedic surgeon on-call for further outpatient evaluation and management.  Spoke with the patient and family regarding prescribing Percocet for home advised them of the risk of fall and unsteady gait as a result.  Clinical Course as of Sep 01 502  Sun Aug 31, 2017  0447 DG Knee Complete 4 Views Right [RB]    Clinical Course User Index [RB] Gregor Hams, MD    ____________________________________________  FINAL CLINICAL IMPRESSION(S) / ED DIAGNOSES  Final  diagnoses:  Effusion of right knee  Osteoarthritis of right knee, unspecified osteoarthritis type     MEDICATIONS GIVEN DURING THIS VISIT:  Medications  oxyCODONE-acetaminophen (PERCOCET/ROXICET) 5-325 MG per tablet 1 tablet (1 tablet Oral Given 08/31/17 0115)     ED Discharge Orders        Ordered    oxyCODONE-acetaminophen (PERCOCET) 5-325 MG tablet  Every 4 hours PRN     08/31/17 0453       Note:  This document was prepared using Dragon voice recognition software and may include unintentional dictation errors.    Gregor Hams, MD 08/31/17 [phone]

## 2017-08-31 NOTE — ED Notes (Addendum)
Patient declined hospital provided walker, reporting she has 3 walkers at home.   Patient ambulated with walker with RN and Judson Roch NT as standby assists.  Patient tolerated well.

## 2017-08-31 NOTE — ED Notes (Signed)
Reviewed discharge instructions, follow-up care, and prescriptions with patient. Patient verbalized understanding of all information reviewed. Patient stable, with no distress noted at this time.    

## 2017-08-31 NOTE — ED Notes (Addendum)
Patient returned from MRI.

## 2017-09-02 DIAGNOSIS — M25461 Effusion, right knee: Secondary | ICD-10-CM | POA: Diagnosis not present

## 2017-09-02 DIAGNOSIS — M1711 Unilateral primary osteoarthritis, right knee: Secondary | ICD-10-CM | POA: Diagnosis not present

## 2017-09-11 ENCOUNTER — Ambulatory Visit
Admission: RE | Admit: 2017-09-11 | Discharge: 2017-09-11 | Disposition: A | Discharge status: Home / Self Care | Payer: Medicare HMO | Source: Ambulatory Visit | Attending: Internal Medicine | Admitting: Internal Medicine

## 2017-09-11 ENCOUNTER — Ambulatory Visit: Payer: Medicare HMO | Admitting: Anesthesiology

## 2017-09-11 ENCOUNTER — Encounter
Admission: RE | Disposition: A | Discharge status: Home / Self Care | Payer: Self-pay | Source: Ambulatory Visit | Attending: Internal Medicine

## 2017-09-11 DIAGNOSIS — I081 Rheumatic disorders of both mitral and tricuspid valves: Secondary | ICD-10-CM | POA: Diagnosis not present

## 2017-09-11 DIAGNOSIS — Z880 Allergy status to penicillin: Secondary | ICD-10-CM | POA: Insufficient documentation

## 2017-09-11 DIAGNOSIS — Z881 Allergy status to other antibiotic agents status: Secondary | ICD-10-CM | POA: Diagnosis not present

## 2017-09-11 DIAGNOSIS — G4730 Sleep apnea, unspecified: Secondary | ICD-10-CM | POA: Insufficient documentation

## 2017-09-11 DIAGNOSIS — C20 Malignant neoplasm of rectum: Secondary | ICD-10-CM | POA: Diagnosis not present

## 2017-09-11 DIAGNOSIS — Z7902 Long term (current) use of antithrombotics/antiplatelets: Secondary | ICD-10-CM | POA: Diagnosis not present

## 2017-09-11 DIAGNOSIS — C218 Malignant neoplasm of overlapping sites of rectum, anus and anal canal: Secondary | ICD-10-CM | POA: Insufficient documentation

## 2017-09-11 DIAGNOSIS — Z7901 Long term (current) use of anticoagulants: Secondary | ICD-10-CM | POA: Insufficient documentation

## 2017-09-11 DIAGNOSIS — I4891 Unspecified atrial fibrillation: Secondary | ICD-10-CM | POA: Diagnosis not present

## 2017-09-11 DIAGNOSIS — Z91041 Radiographic dye allergy status: Secondary | ICD-10-CM | POA: Diagnosis not present

## 2017-09-11 DIAGNOSIS — Z8673 Personal history of transient ischemic attack (TIA), and cerebral infarction without residual deficits: Secondary | ICD-10-CM | POA: Diagnosis not present

## 2017-09-11 DIAGNOSIS — G4733 Obstructive sleep apnea (adult) (pediatric): Secondary | ICD-10-CM | POA: Diagnosis not present

## 2017-09-11 DIAGNOSIS — E039 Hypothyroidism, unspecified: Secondary | ICD-10-CM | POA: Diagnosis not present

## 2017-09-11 DIAGNOSIS — Z888 Allergy status to other drugs, medicaments and biological substances status: Secondary | ICD-10-CM | POA: Diagnosis not present

## 2017-09-11 HISTORY — PX: EUS: SHX5427

## 2017-09-11 SURGERY — ULTRASOUND, LOWER GI TRACT, ENDOSCOPIC
Anesthesia: General

## 2017-09-11 MED ORDER — SPOT INK MARKER SYRINGE KIT
PACK | SUBMUCOSAL | Status: DC | PRN
  Administered 2017-09-11: 10 mL via SUBMUCOSAL

## 2017-09-11 MED ORDER — LIDOCAINE HCL (PF) 2 % IJ SOLN
INTRAMUSCULAR | Status: AC
  Filled 2017-09-11: qty 10

## 2017-09-11 MED ORDER — PROPOFOL 500 MG/50ML IV EMUL
INTRAVENOUS | Status: DC | PRN
  Administered 2017-09-11: 100 ug/kg/min via INTRAVENOUS

## 2017-09-11 MED ORDER — PROPOFOL 500 MG/50ML IV EMUL
INTRAVENOUS | Status: AC
  Filled 2017-09-11: qty 50

## 2017-09-11 MED ORDER — LIDOCAINE HCL (CARDIAC) PF 100 MG/5ML IV SOSY
PREFILLED_SYRINGE | INTRAVENOUS | Status: DC | PRN
  Administered 2017-09-11: 100 mg via INTRAVENOUS

## 2017-09-11 MED ORDER — SODIUM CHLORIDE 0.9 % IV SOLN
INTRAVENOUS | Status: DC
  Administered 2017-09-11: 1000 mL via INTRAVENOUS

## 2017-09-11 MED ORDER — PROPOFOL 10 MG/ML IV BOLUS
INTRAVENOUS | Status: AC
  Filled 2017-09-11: qty 20

## 2017-09-11 MED ORDER — PROPOFOL 10 MG/ML IV BOLUS
INTRAVENOUS | Status: DC | PRN
  Administered 2017-09-11: 50 mg via INTRAVENOUS

## 2017-09-11 NOTE — Transfer of Care (Signed)
Immediate Anesthesia Transfer of Care Note  Patient: Nicole Walton  Procedure(s) Performed: LOWER ENDOSCOPIC ULTRASOUND (EUS) (N/A )  Patient Location: PACU and Endoscopy Unit  Anesthesia Type:General  Level of Consciousness: awake  Airway & Oxygen Therapy: Patient Spontanous Breathing  Post-op Assessment: Report given to RN  Post vital signs: stable  Last Vitals:  Vitals Value Taken Time  BP 133/79 09/11/2017  1:37 PM  Temp 36.1 C 09/11/2017  1:38 PM  Pulse 75 09/11/2017  1:39 PM  Resp 20 09/11/2017  1:39 PM  SpO2 99 % 09/11/2017  1:39 PM  Vitals shown include unvalidated device data.  Last Pain:  Vitals:   09/11/17 1338  TempSrc: Tympanic  PainSc: 0-No pain      Patients Stated Pain Goal: 0 (99/23/41 4436)  Complications: No apparent anesthesia complications

## 2017-09-11 NOTE — Op Note (Signed)
[facility] Gastroenterology Patient Name: Nicole Walton Procedure Date: 09/11/2017 12:30 PM MRN: [id number] Account #: [id number] Date of Birth: February 03, 1932 Admit Type: Outpatient Age: 84 Room: [facility] ENDO ROOM 3 Gender: Female Note Status: Finalized Procedure:            Lower EUS Indications:          Pre-treatment staging for anorectal carcinoma arising                        in a rectal polypoid mass (pelvic MRI with no evidence                        of adenopathy) Patient Profile:      Refer to note in patient chart for documentation of                        history and physical. Providers:            Murray Hodgkins. Jolonda Gomm Referring MD:         Tracie Harrier, MD (Referring MD), Lin Landsman                        MD, MD (Referring MD) Medicines:            Propofol per Anesthesia Complications:        No immediate complications. Procedure:            Pre-Anesthesia Assessment:                       Prior to the procedure, a History and Physical was                        performed, and patient medications and allergies were                        reviewed. The patient is competent. The risks and                        benefits of the procedure and the sedation options and                        risks were discussed with the patient. All questions                        were answered and informed consent was obtained.                        Patient identification and proposed procedure were                        verified by the physician, the nurse and the                        anesthetist in the pre-procedure area. Mental Status                        Examination: alert and oriented. Airway Examination:                        normal oropharyngeal airway and neck  mobility.                        Respiratory Examination: clear to auscultation. CV                        Examination: normal. Prophylactic Antibiotics: The                         patient does not require prophylactic antibiotics.                        Prior Anticoagulants: The patient has taken Plavix                        (clopidogrel) and Coumadin, last dose was 5 days prior                        to procedure. Last dose of bridging Lovenox was                        yesterday morning. ASA Grade Assessment: III                       A patient with severe systemic disease. After reviewing                        the risks and benefits, the patient was deemed in                        satisfactory condition to undergo the procedure. The                        anesthesia plan was to use monitored anesthesia care                        (MAC). Immediately prior to administration of                        medications, the patient was re-assessed for adequacy                        to receive sedatives. The heart rate, respiratory rate,                        oxygen saturations, blood pressure, adequacy of                        pulmonary ventilation, and response to care were                        monitored throughout the procedure. The physical status                        of the patient was re-assessed after the procedure.                       After obtaining informed consent, the endoscope was                        passed under direct vision. Throughout  the procedure,                        the patient's blood pressure, pulse, and oxygen                        saturations were monitored continuously. The Endoscope                        was introduced through the anus and advanced to the the                        sigmoid colon. The Endoscope was introduced through the                        anus and advanced to the the sigmoid colon for                        ultrasound. The lower EUS was accomplished without                        difficulty. The patient tolerated the procedure well.                        The quality of the bowel preparation was  good. Findings:      The perianal and digital rectal examinations were normal.      ENDOSCOPIC FINDING: :      A medium scar was found in the distal rectum. The scar was unremarkable       in appearance. Biopsies were taken with a cold forceps for histology.       The mucosa adjacent to the scar was tattooed with an injection of [place]       ink.      The exam was otherwise normal.      ENDOSONOGRAPHIC FINDING: :      Local wall thickening/irregularity was found in the rectum at site of       scar. The site of thickening was non-circumferential and located       predominantly at the left-anterior rectal wall. This finding appeared to       be primarily affecting the muscularis propria (Layer 4), which looked       irregular. The submucosa and muscularis mucosa also appeared irregular.      The perirectal space was normal.      No lymph nodes were seen in the perirectal region.      The anal canal was normal. Impression:           Flexible Sigmoidoscopy Impressions:                       - Scar in the distal rectum representing site of prior                        endoscopic resection. The scar site was biopsied and                        tattooed.                       - Otherwise normal flexible sigmoidoscopy.  Rectal EUS Impressions:                       - Local wall thickening/irregularity was visualized                        endosonographically in the rectum. Primarily affecting                        the muscularis propria (layer 4), but also involving                        the deep mucosa (Layer 2) and submucosa (Layer 3).                        Uncertain if this represents residual tumor or scar                        tissue/inflammation related to recent endoscopic                        resection. Staging would be considered T2 (at least) N0                        Mx if biopsies confirm residual cancer.                       - Endosonographic images of  the perirectal space were                        unremarkable.                       - No lymph nodes were seen in the perirectal region                        during endosonographic examination.                       - Endosonographic images of the anal canal were                        unremarkable. Recommendation:       - Discharge patient to home (ambulatory).                       - Await path results.                       - May resume anticoagulation tomorrow.                       - Refer to medical/radiation oncology and surgery for                        further evaluation of rectal adenocarcinoma.                       - The findings and recommendations were discussed with                        the patient and her family.                       -  The findings and recommendations were discussed with                        the referring physician. Procedure Code(s):    --- Professional ---                       [phone], Sigmoidoscopy, flexible; with endoscopic                        ultrasound examination                       37096, Sigmoidoscopy, flexible; with biopsy, single or                        multiple                       45335, Sigmoidoscopy, flexible; with directed                        submucosal injection(s), any substance Diagnosis Code(s):    --- Professional ---                       C21.8, Malignant neoplasm of overlapping sites of                        rectum, anus and anal canal                       K62.89, Other specified diseases of anus and rectum CPT copyright 2017 American Medical Association. All rights reserved. The codes documented in this report are preliminary and upon coder review may  be revised to meet current compliance requirements. Attending Participation:      I personally performed the entire procedure without the assistance of a       fellow, resident or surgical assistant. [place],  09/11/2017 1:44:18 PM This report has  been signed electronically. Number of Addenda: 0 Note Initiated On: 09/11/2017 12:30 PM Estimated Blood Loss: Estimated blood loss: none.      [facility]

## 2017-09-11 NOTE — Discharge Instructions (Signed)
Discharge to home °

## 2017-09-11 NOTE — H&P (Signed)
Patient presenting for a rectal EUS of recently diagnosed rectal adenocarcinoma.  Past Medical History:  Diagnosis Date  . Atrial fibrillation ([place])   . Dysrhythmia    A-fib  . Edema    feet/legs  . Hypothyroidism   . Incontinence   . Shortness of breath dyspnea    with exersion  . Sleep apnea   . Stroke ([place])   . TIA (transient ischemic attack)          Past Surgical History:  Procedure Laterality Date  . CATARACT EXTRACTION W/PHACO Right 04/20/2015   Procedure: CATARACT EXTRACTION PHACO AND INTRAOCULAR LENS PLACEMENT (IOC);  Surgeon: Leandrew Koyanagi, MD;  Location: [facility] ORS;  Service: Ophthalmology;  Laterality: Right;  [place]   1:12.1 AP%  16.2 CDE   11.69 fluid cassette lot# [id number]   exp.07/04/2016  . CATARACT EXTRACTION W/PHACO Left 06/01/2015   Procedure: CATARACT EXTRACTION PHACO AND INTRAOCULAR LENS PLACEMENT (IOC);  Surgeon: Leandrew Koyanagi, MD;  Location: [facility] ORS;  Service: Ophthalmology;  Laterality: Left;  [place] 1.17 AP% 9.9 CDE 765 Fluid Pack Lot # [id number]  . CESAREAN SECTION    . CHOLECYSTECTOMY    . COLONOSCOPY    . COLONOSCOPY WITH PROPOFOL N/A 07/14/2017   Procedure: COLONOSCOPY WITH PROPOFOL;  Surgeon: Lin Landsman, MD;  Location: [facility] ENDOSCOPY;  Service: Gastroenterology;  Laterality: N/A;  . FOOT SURGERY             Prior to Admission medications   Medication Sig Start Date End Date Taking? Authorizing Provider  atenolol (TENORMIN) 25 MG tablet Take 1 tablet by mouth daily. 11/04/16  Yes [provider]  Calcium-Magnesium-Vitamin D (CALCIUM 500 PO) Take 1 tablet by mouth daily.   Yes [provider]  Cetirizine HCl (ZYRTEC ALLERGY) 10 MG CAPS Take 1 tablet by mouth daily.   Yes [provider]  clopidogrel (PLAVIX) 75 MG tablet Take by mouth. 01/09/17  Yes [provider]  clotrimazole-betamethasone (LOTRISONE) cream Apply topically. 12/06/16 12/06/17 Yes [provider]  enoxaparin (LOVENOX) 100 MG/ML injection Inject 0.95 mLs (95 mg total) into the skin every 12 (twelve) hours. 07/14/17  Yes Fritzi Mandes, MD  furosemide (LASIX) 20 MG tablet Take 80 mg by mouth daily.  11/17/16  Yes [provider]  furosemide (LASIX) 40 MG tablet  06/13/17  Yes [provider]  HYDROcodone-acetaminophen (NORCO/VICODIN) 5-325 MG tablet Take by mouth. 03/08/16  Yes [provider]  ketoconazole (NIZORAL) 2 % cream Apply 1 application topically daily. 11/07/16  Yes [provider]  KRILL OIL PO Take 1 tablet by mouth as needed.   Yes [provider]  levothyroxine (SYNTHROID, LEVOTHROID) 50 MCG tablet Take 50 mcg by mouth daily before breakfast.   Yes [provider]  Menthol-Zinc Oxide (CALMOSEPTINE) 0.44-20.6 % OINT Apply 1 application topically daily. 11/07/16  Yes [provider]  nitroGLYCERIN (NITROSTAT) 0.4 MG SL tablet Place 0.4 mg under the tongue every 5 (five) minutes as needed for chest pain.   Yes [provider]  oxybutynin (DITROPAN) 5 MG tablet Take 5 mg by mouth 3 (three) times daily.   Yes [provider]  triamcinolone cream (KENALOG) 0.1 % Apply 1 application topically 2 (two) times daily.   Yes [provider]  vitamin B-12 (CYANOCOBALAMIN) 500 MCG tablet Take 1 tablet by mouth 2 (two) times daily.   Yes [provider]  warfarin (COUMADIN) 4 MG tablet TAKE 1 TABLET (4MG ) DAILY MONDAY THROUGH FRIDAY. (TAKE 5MG  ON SATURDAY  AND SUNDAY AS DIRECTED) 04/22/17  Yes [provider]  warfarin (COUMADIN) 5 MG tablet TAKE 1 TABLET AS DIRECTED 05/06/17  Yes [provider]  levothyroxine (SYNTHROID, LEVOTHROID) 75 MCG tablet Take by mouth. 04/02/17 04/02/18  [provider]         Allergies as of 07/15/2017 - Review Complete 07/14/2017  Allergen Reaction Noted  . Ciprofloxacin Other (See Comments) 04/20/2015  . Contrast media  [iodinated diagnostic agents] Swelling 04/13/2015  . Nitrofurantoin Other (See Comments) 04/30/2013  . Penicillin g Other (See Comments) 04/30/2013         Family History  Problem Relation Age of Onset  . Atrial fibrillation Son   . Diabetes Daughter   . Breast cancer Neg Hx     Social History        Socioeconomic History  . Marital status: Married    Spouse name: Not on file  . Number of children: Not on file  . Years of education: Not on file  . Highest education level: Not on file  Occupational History  . Occupation: retired  Scientific laboratory technician  . Financial resource strain: Not hard at all  . Food insecurity:    Worry: Never true    Inability: Never true  . Transportation needs:    Medical: No    Non-medical: No  Tobacco Use  . Smoking status: Never Smoker  . Smokeless tobacco: Never Used  Substance and Sexual Activity  . Alcohol use: No  . Drug use: No  . Sexual activity: Not on file  Lifestyle  . Physical activity:    Days per week: 5 days    Minutes per session: 20 min  . Stress: Not at all  Relationships  . Social connections:    Talks on phone: Three times a week    Gets together: Three times a week    Attends religious service: 1 to 4 times per year    Active member of club or organization: No    Attends meetings of clubs or organizations: Never    Relationship status: Married  . Intimate partner violence:    Fear of current or ex partner: No    Emotionally abused: No    Physically abused: No    Forced sexual activity: No  Other Topics Concern  . Not on file  Social History Narrative   Live at home with spouse, do regular yard work daily    Review of Systems: See HPI, otherwise negative ROS  Physical Exam: General:   Alert,  pleasant and cooperative in NAD Head:  Normocephalic and atraumatic. Neck:  Supple; no masses or thyromegaly. Lungs:  Clear throughout to auscultation.    Heart:  Regular rate  and rhythm. Abdomen:  Soft, nontender and nondistended. Normal bowel sounds, without guarding, and without rebound.   Neurologic:  Alert and  oriented x4;  grossly normal neurologically.  Impression/Plan: Nicole Walton is here for a rectal EUS to be performed for staging of colon cancer.  Risks, benefits, limitations, and alternatives regarding  the rectal EUS have been reviewed with the patient.  Questions have been answered.  All parties agreeable.   Tillie Rung, MD GI Attending

## 2017-09-11 NOTE — Anesthesia Post-op Follow-up Note (Signed)
Anesthesia QCDR form completed.        

## 2017-09-11 NOTE — Anesthesia Preprocedure Evaluation (Addendum)
Anesthesia Evaluation  Patient identified by MRN, date of birth, ID band Patient awake    Reviewed: Allergy & Precautions, H&P , NPO status , reviewed documented beta blocker date and time   Airway Mallampati: III  TM Distance: >3 FB Neck ROM: limited    Dental  (+) Upper Dentures, Lower Dentures, Chipped, Missing   Pulmonary shortness of breath, sleep apnea ,    Pulmonary exam normal        Cardiovascular + dysrhythmias Atrial Fibrillation  Rhythm:irregular  02/2016 ECHO Study Conclusions  - Left ventricle: The cavity size was normal. Wall thickness was   increased in a pattern of mild LVH. Systolic function was   vigorous. The estimated ejection fraction was in the range of 65%   to 70%. Wall motion was normal; there were no regional wall   motion abnormalities. Doppler parameters are consistent with   abnormal left ventricular relaxation (grade 1 diastolic   dysfunction). - Mitral valve: There was mild regurgitation. - Tricuspid valve: There was moderate regurgitation.  Impressions:  - Normal Overal LVF   Normal Wall Motion   EF=55%   Normal Right side.   Neuro/Psych TIACVA    GI/Hepatic   Endo/Other  Hypothyroidism   Renal/GU      Musculoskeletal   Abdominal   Peds  Hematology   Anesthesia Other Findings Past Medical History: No date: Atrial fibrillation ([place]) No date: Dysrhythmia     Comment:  A-fib No date: Edema     Comment:  feet/legs No date: Hypothyroidism No date: Incontinence No date: Shortness of breath dyspnea     Comment:  with exersion No date: Sleep apnea No date: Stroke [facility]) No date: TIA (transient ischemic attack) No date: TIA (transient ischemic attack)  Past Surgical History: 04/20/2015: CATARACT EXTRACTION W/PHACO; Right     Comment:  Procedure: CATARACT EXTRACTION PHACO AND INTRAOCULAR               LENS PLACEMENT (IOC);  Surgeon: Leandrew Koyanagi, MD;   Location: [facility] ORS;  Service: Ophthalmology;  Laterality:              Right;  [place]   1:12.1 AP%  16.2 CDE   11.69 fluid               cassette lot# [id number]   exp.07/04/2016 06/01/2015: CATARACT EXTRACTION W/PHACO; Left     Comment:  Procedure: CATARACT EXTRACTION PHACO AND INTRAOCULAR               LENS PLACEMENT (IOC);  Surgeon: Leandrew Koyanagi, MD;               Location: [facility] ORS;  Service: Ophthalmology;  Laterality:              Left;  [place] 1.17 AP% 9.9 CDE 765 Fluid Pack Lot #               [id number] No date: CESAREAN SECTION No date: CHOLECYSTECTOMY No date: COLONOSCOPY 07/14/2017: COLONOSCOPY WITH PROPOFOL; N/A     Comment:  Procedure: COLONOSCOPY WITH PROPOFOL;  Surgeon: Lin Landsman, MD;  Location: [facility] ENDOSCOPY;  Service:               Gastroenterology;  Laterality: N/A; 07/18/2017: COLONOSCOPY WITH PROPOFOL; N/A     Comment:  Procedure: COLONOSCOPY WITH PROPOFOL;  Surgeon: Marius Ditch,  Tally Due, MD;  Location: [place];  Service:               Gastroenterology;  Laterality: N/A; No date: FOOT SURGERY     Reproductive/Obstetrics                             Anesthesia Physical Anesthesia Plan  ASA: III  Anesthesia Plan: General   Post-op Pain Management:    Induction: Intravenous  PONV Risk Score and Plan: 3 and Treatment may vary due to age or medical condition and TIVA  Airway Management Planned: Nasal Cannula and Natural Airway  Additional Equipment:   Intra-op Plan:   Post-operative Plan:   Informed Consent: I have reviewed the patients History and Physical, chart, labs and discussed the procedure including the risks, benefits and alternatives for the proposed anesthesia with the patient or authorized representative who has indicated his/her understanding and acceptance.   Dental Advisory Given  Plan Discussed with:   Anesthesia Plan Comments:         Anesthesia Quick  Evaluation

## 2017-09-12 NOTE — Progress Notes (Signed)
EUS report routed to Dr. Marius Ditch. Ms. Nicole Walton still needs referral for medical and radiation oncology. Diagnosed with rectal cancer in June. Oncology Nurse Navigator Documentation  Navigator Location: [facility] (09/12/17 0800)   )                                                     Time Spent with Patient: 15 (09/12/17 0800)

## 2017-09-15 DIAGNOSIS — Z7901 Long term (current) use of anticoagulants: Secondary | ICD-10-CM | POA: Diagnosis not present

## 2017-09-15 LAB — SURGICAL PATHOLOGY

## 2017-09-15 NOTE — Anesthesia Postprocedure Evaluation (Signed)
Anesthesia Post Note  Patient: Nicole Walton  Procedure(s) Performed: LOWER ENDOSCOPIC ULTRASOUND (EUS) (N/A )  Patient location during evaluation: Endoscopy Anesthesia Type: General Level of consciousness: awake and alert Pain management: pain level controlled Vital Signs Assessment: post-procedure vital signs reviewed and stable Respiratory status: spontaneous breathing, nonlabored ventilation and respiratory function stable Cardiovascular status: blood pressure returned to baseline and stable Postop Assessment: no apparent nausea or vomiting Anesthetic complications: no     Last Vitals:  Vitals:   09/11/17 1349 09/11/17 1357  BP:  129/78  Pulse: 73 77  Resp: (!) 25 17  Temp:    SpO2: 97% 95%    Last Pain:  Vitals:   09/11/17 1357  TempSrc:   PainSc: 0-No pain                 Alphonsus Sias

## 2017-09-23 ENCOUNTER — Telehealth: Payer: Self-pay

## 2017-09-23 ENCOUNTER — Other Ambulatory Visit: Payer: Self-pay

## 2017-09-23 DIAGNOSIS — R791 Abnormal coagulation profile: Secondary | ICD-10-CM | POA: Diagnosis not present

## 2017-09-23 NOTE — Telephone Encounter (Signed)
Patients daughter in law [name]) inquired about the status of a referral that she states we were making to Oncology for her mother in law-looks like Roderic Ovens is working on it.  She also requested to know the results of the biopsy that was done on 09/11/17.  I advised patients daughter in law I would look into it for her.   Please advise.  Thank you,  Sharyn Lull

## 2017-09-24 NOTE — Telephone Encounter (Signed)
Patient's daughter [name] has been notified of results and verbalized understanding and will await your call this week

## 2017-10-01 ENCOUNTER — Telehealth: Payer: Self-pay

## 2017-10-01 NOTE — Telephone Encounter (Signed)
Received call from daughter in law asking for follow up on tumor board meeting. Per the tumor board Ms. Chancellor does not need to consult with medical or surgical oncology based on results from EUS and pathology taken during that procedure. She does need to continue close follow up with her GI physician. She verbalized understanding and read back performed. Oncology Nurse Navigator Documentation  Navigator Location: [facility] (10/01/17 1500)   )Navigator Encounter Type: Telephone (10/01/17 1500) Telephone: Incoming Call (10/01/17 1500)                                                  Time Spent with Patient: 15 (10/01/17 1500)

## 2017-10-13 ENCOUNTER — Other Ambulatory Visit: Payer: Self-pay

## 2017-10-13 DIAGNOSIS — C20 Malignant neoplasm of rectum: Secondary | ICD-10-CM

## 2017-10-20 DIAGNOSIS — H26492 Other secondary cataract, left eye: Secondary | ICD-10-CM | POA: Diagnosis not present

## 2017-10-20 DIAGNOSIS — H353131 Nonexudative age-related macular degeneration, bilateral, early dry stage: Secondary | ICD-10-CM | POA: Diagnosis not present

## 2017-10-29 DIAGNOSIS — C20 Malignant neoplasm of rectum: Secondary | ICD-10-CM | POA: Diagnosis not present

## 2017-10-29 DIAGNOSIS — I4891 Unspecified atrial fibrillation: Secondary | ICD-10-CM | POA: Diagnosis not present

## 2017-10-29 DIAGNOSIS — Z09 Encounter for follow-up examination after completed treatment for conditions other than malignant neoplasm: Secondary | ICD-10-CM | POA: Diagnosis not present

## 2017-10-29 DIAGNOSIS — G4733 Obstructive sleep apnea (adult) (pediatric): Secondary | ICD-10-CM | POA: Diagnosis not present

## 2017-10-29 DIAGNOSIS — E039 Hypothyroidism, unspecified: Secondary | ICD-10-CM | POA: Diagnosis not present

## 2017-10-30 DIAGNOSIS — G4733 Obstructive sleep apnea (adult) (pediatric): Secondary | ICD-10-CM | POA: Diagnosis not present

## 2017-11-05 DIAGNOSIS — Z6837 Body mass index (BMI) 37.0-37.9, adult: Secondary | ICD-10-CM | POA: Diagnosis not present

## 2017-11-05 DIAGNOSIS — Z23 Encounter for immunization: Secondary | ICD-10-CM | POA: Diagnosis not present

## 2017-11-05 DIAGNOSIS — D649 Anemia, unspecified: Secondary | ICD-10-CM | POA: Diagnosis not present

## 2017-11-05 DIAGNOSIS — C20 Malignant neoplasm of rectum: Secondary | ICD-10-CM | POA: Diagnosis not present

## 2017-11-05 DIAGNOSIS — I4891 Unspecified atrial fibrillation: Secondary | ICD-10-CM | POA: Diagnosis not present

## 2017-11-05 DIAGNOSIS — E538 Deficiency of other specified B group vitamins: Secondary | ICD-10-CM | POA: Diagnosis not present

## 2017-11-05 DIAGNOSIS — E039 Hypothyroidism, unspecified: Secondary | ICD-10-CM | POA: Diagnosis not present

## 2017-11-05 DIAGNOSIS — Z0000 Encounter for general adult medical examination without abnormal findings: Secondary | ICD-10-CM | POA: Diagnosis not present

## 2017-11-13 DIAGNOSIS — I4891 Unspecified atrial fibrillation: Secondary | ICD-10-CM | POA: Diagnosis not present

## 2017-11-13 DIAGNOSIS — I071 Rheumatic tricuspid insufficiency: Secondary | ICD-10-CM | POA: Diagnosis not present

## 2017-11-13 DIAGNOSIS — R05 Cough: Secondary | ICD-10-CM | POA: Diagnosis not present

## 2017-11-13 DIAGNOSIS — R002 Palpitations: Secondary | ICD-10-CM | POA: Diagnosis not present

## 2017-11-13 DIAGNOSIS — I361 Nonrheumatic tricuspid (valve) insufficiency: Secondary | ICD-10-CM | POA: Diagnosis not present

## 2017-11-13 DIAGNOSIS — I480 Paroxysmal atrial fibrillation: Secondary | ICD-10-CM | POA: Diagnosis not present

## 2017-11-13 DIAGNOSIS — Z7901 Long term (current) use of anticoagulants: Secondary | ICD-10-CM | POA: Diagnosis not present

## 2017-11-13 DIAGNOSIS — R0609 Other forms of dyspnea: Secondary | ICD-10-CM | POA: Diagnosis not present

## 2017-11-13 DIAGNOSIS — I10 Essential (primary) hypertension: Secondary | ICD-10-CM | POA: Diagnosis not present

## 2017-11-13 DIAGNOSIS — J40 Bronchitis, not specified as acute or chronic: Secondary | ICD-10-CM | POA: Diagnosis not present

## 2017-11-27 DIAGNOSIS — E039 Hypothyroidism, unspecified: Secondary | ICD-10-CM | POA: Diagnosis not present

## 2017-11-27 DIAGNOSIS — C20 Malignant neoplasm of rectum: Secondary | ICD-10-CM | POA: Diagnosis not present

## 2017-11-27 DIAGNOSIS — M533 Sacrococcygeal disorders, not elsewhere classified: Secondary | ICD-10-CM | POA: Diagnosis not present

## 2017-11-27 DIAGNOSIS — I482 Chronic atrial fibrillation, unspecified: Secondary | ICD-10-CM | POA: Diagnosis not present

## 2017-11-27 DIAGNOSIS — M25551 Pain in right hip: Secondary | ICD-10-CM | POA: Diagnosis not present

## 2017-11-27 DIAGNOSIS — Z9181 History of falling: Secondary | ICD-10-CM | POA: Diagnosis not present

## 2017-11-27 DIAGNOSIS — S3993XA Unspecified injury of pelvis, initial encounter: Secondary | ICD-10-CM | POA: Diagnosis not present

## 2017-11-27 DIAGNOSIS — Z6841 Body Mass Index (BMI) 40.0 and over, adult: Secondary | ICD-10-CM | POA: Diagnosis not present

## 2017-11-27 DIAGNOSIS — S79911A Unspecified injury of right hip, initial encounter: Secondary | ICD-10-CM | POA: Diagnosis not present

## 2017-12-08 DIAGNOSIS — L851 Acquired keratosis [keratoderma] palmaris et plantaris: Secondary | ICD-10-CM | POA: Diagnosis not present

## 2017-12-12 DIAGNOSIS — Z7901 Long term (current) use of anticoagulants: Secondary | ICD-10-CM | POA: Diagnosis not present

## 2017-12-12 DIAGNOSIS — I4891 Unspecified atrial fibrillation: Secondary | ICD-10-CM | POA: Diagnosis not present

## 2017-12-25 DIAGNOSIS — H26492 Other secondary cataract, left eye: Secondary | ICD-10-CM | POA: Diagnosis not present

## 2018-01-12 DIAGNOSIS — Z7901 Long term (current) use of anticoagulants: Secondary | ICD-10-CM | POA: Diagnosis not present

## 2018-01-12 DIAGNOSIS — I4891 Unspecified atrial fibrillation: Secondary | ICD-10-CM | POA: Diagnosis not present

## 2018-01-27 DIAGNOSIS — J208 Acute bronchitis due to other specified organisms: Secondary | ICD-10-CM | POA: Diagnosis not present

## 2018-02-09 DIAGNOSIS — I4891 Unspecified atrial fibrillation: Secondary | ICD-10-CM | POA: Diagnosis not present

## 2018-02-09 DIAGNOSIS — Z7901 Long term (current) use of anticoagulants: Secondary | ICD-10-CM | POA: Diagnosis not present

## 2018-03-03 DIAGNOSIS — C20 Malignant neoplasm of rectum: Secondary | ICD-10-CM | POA: Diagnosis not present

## 2018-03-03 DIAGNOSIS — Z7901 Long term (current) use of anticoagulants: Secondary | ICD-10-CM | POA: Diagnosis not present

## 2018-03-03 DIAGNOSIS — E538 Deficiency of other specified B group vitamins: Secondary | ICD-10-CM | POA: Diagnosis not present

## 2018-03-03 DIAGNOSIS — I4891 Unspecified atrial fibrillation: Secondary | ICD-10-CM | POA: Diagnosis not present

## 2018-03-03 DIAGNOSIS — Z6837 Body mass index (BMI) 37.0-37.9, adult: Secondary | ICD-10-CM | POA: Diagnosis not present

## 2018-03-03 DIAGNOSIS — E039 Hypothyroidism, unspecified: Secondary | ICD-10-CM | POA: Diagnosis not present

## 2018-03-10 DIAGNOSIS — E538 Deficiency of other specified B group vitamins: Secondary | ICD-10-CM | POA: Diagnosis not present

## 2018-03-10 DIAGNOSIS — C20 Malignant neoplasm of rectum: Secondary | ICD-10-CM | POA: Diagnosis not present

## 2018-03-10 DIAGNOSIS — I482 Chronic atrial fibrillation, unspecified: Secondary | ICD-10-CM | POA: Diagnosis not present

## 2018-03-10 DIAGNOSIS — E039 Hypothyroidism, unspecified: Secondary | ICD-10-CM | POA: Diagnosis not present

## 2018-03-10 DIAGNOSIS — I480 Paroxysmal atrial fibrillation: Secondary | ICD-10-CM | POA: Diagnosis not present

## 2018-03-10 DIAGNOSIS — Z6837 Body mass index (BMI) 37.0-37.9, adult: Secondary | ICD-10-CM | POA: Diagnosis not present

## 2018-03-10 DIAGNOSIS — I4891 Unspecified atrial fibrillation: Secondary | ICD-10-CM | POA: Diagnosis not present

## 2018-03-10 DIAGNOSIS — R21 Rash and other nonspecific skin eruption: Secondary | ICD-10-CM | POA: Diagnosis not present

## 2018-03-10 DIAGNOSIS — G4733 Obstructive sleep apnea (adult) (pediatric): Secondary | ICD-10-CM | POA: Diagnosis not present

## 2018-03-18 ENCOUNTER — Encounter: Payer: Self-pay | Admitting: *Deleted

## 2018-03-19 ENCOUNTER — Ambulatory Visit
Admission: RE | Admit: 2018-03-19 | Discharge: 2018-03-19 | Disposition: A | Discharge status: Home / Self Care | Payer: Medicare HMO | Attending: Gastroenterology | Admitting: Gastroenterology

## 2018-03-19 ENCOUNTER — Encounter: Payer: Self-pay | Admitting: *Deleted

## 2018-03-19 ENCOUNTER — Encounter
Admission: RE | Disposition: A | Discharge status: Home / Self Care | Payer: Self-pay | Source: Home / Self Care | Attending: Gastroenterology

## 2018-03-19 ENCOUNTER — Ambulatory Visit: Payer: Medicare HMO | Admitting: Anesthesiology

## 2018-03-19 DIAGNOSIS — Z880 Allergy status to penicillin: Secondary | ICD-10-CM | POA: Insufficient documentation

## 2018-03-19 DIAGNOSIS — Z9842 Cataract extraction status, left eye: Secondary | ICD-10-CM | POA: Diagnosis not present

## 2018-03-19 DIAGNOSIS — Z833 Family history of diabetes mellitus: Secondary | ICD-10-CM | POA: Insufficient documentation

## 2018-03-19 DIAGNOSIS — E785 Hyperlipidemia, unspecified: Secondary | ICD-10-CM | POA: Diagnosis not present

## 2018-03-19 DIAGNOSIS — Z881 Allergy status to other antibiotic agents status: Secondary | ICD-10-CM | POA: Insufficient documentation

## 2018-03-19 DIAGNOSIS — I4891 Unspecified atrial fibrillation: Secondary | ICD-10-CM | POA: Diagnosis not present

## 2018-03-19 DIAGNOSIS — Z9049 Acquired absence of other specified parts of digestive tract: Secondary | ICD-10-CM | POA: Diagnosis not present

## 2018-03-19 DIAGNOSIS — Z888 Allergy status to other drugs, medicaments and biological substances status: Secondary | ICD-10-CM | POA: Diagnosis not present

## 2018-03-19 DIAGNOSIS — Z85048 Personal history of other malignant neoplasm of rectum, rectosigmoid junction, and anus: Secondary | ICD-10-CM | POA: Insufficient documentation

## 2018-03-19 DIAGNOSIS — Z91041 Radiographic dye allergy status: Secondary | ICD-10-CM | POA: Insufficient documentation

## 2018-03-19 DIAGNOSIS — Z9841 Cataract extraction status, right eye: Secondary | ICD-10-CM | POA: Diagnosis not present

## 2018-03-19 DIAGNOSIS — Z7901 Long term (current) use of anticoagulants: Secondary | ICD-10-CM | POA: Diagnosis not present

## 2018-03-19 DIAGNOSIS — Z1211 Encounter for screening for malignant neoplasm of colon: Secondary | ICD-10-CM | POA: Insufficient documentation

## 2018-03-19 DIAGNOSIS — Z79899 Other long term (current) drug therapy: Secondary | ICD-10-CM | POA: Diagnosis not present

## 2018-03-19 DIAGNOSIS — Z8249 Family history of ischemic heart disease and other diseases of the circulatory system: Secondary | ICD-10-CM | POA: Insufficient documentation

## 2018-03-19 DIAGNOSIS — G4730 Sleep apnea, unspecified: Secondary | ICD-10-CM | POA: Diagnosis not present

## 2018-03-19 DIAGNOSIS — E039 Hypothyroidism, unspecified: Secondary | ICD-10-CM | POA: Diagnosis not present

## 2018-03-19 DIAGNOSIS — K6389 Other specified diseases of intestine: Secondary | ICD-10-CM | POA: Diagnosis not present

## 2018-03-19 DIAGNOSIS — Z8673 Personal history of transient ischemic attack (TIA), and cerebral infarction without residual deficits: Secondary | ICD-10-CM | POA: Insufficient documentation

## 2018-03-19 DIAGNOSIS — I499 Cardiac arrhythmia, unspecified: Secondary | ICD-10-CM | POA: Diagnosis not present

## 2018-03-19 DIAGNOSIS — G4733 Obstructive sleep apnea (adult) (pediatric): Secondary | ICD-10-CM | POA: Diagnosis not present

## 2018-03-19 DIAGNOSIS — N390 Urinary tract infection, site not specified: Secondary | ICD-10-CM | POA: Diagnosis not present

## 2018-03-19 DIAGNOSIS — C20 Malignant neoplasm of rectum: Secondary | ICD-10-CM

## 2018-03-19 HISTORY — PX: FLEXIBLE SIGMOIDOSCOPY: SHX5431

## 2018-03-19 SURGERY — SIGMOIDOSCOPY, FLEXIBLE
Anesthesia: General

## 2018-03-19 MED ORDER — PROPOFOL 10 MG/ML IV BOLUS
INTRAVENOUS | Status: DC | PRN
  Administered 2018-03-19: 70 mg via INTRAVENOUS

## 2018-03-19 MED ORDER — SODIUM CHLORIDE 0.9 % IV SOLN
INTRAVENOUS | Status: DC
  Administered 2018-03-19: 11:00:00 via INTRAVENOUS

## 2018-03-19 NOTE — Transfer of Care (Signed)
Immediate Anesthesia Transfer of Care Note  Patient: Darsi M Backhaus  Procedure(s) Performed: FLEXIBLE SIGMOIDOSCOPY (N/A )  Patient Location: Endoscopy Unit  Anesthesia Type:General  Level of Consciousness: drowsy and patient cooperative  Airway & Oxygen Therapy: Patient Spontanous Breathing and Patient connected to nasal cannula oxygen  Post-op Assessment: Report given to RN and Post -op Vital signs reviewed and stable  Post vital signs: Reviewed and stable  Last Vitals:  Vitals Value Taken Time  BP 142/66 03/19/2018 11:57 AM  Temp 36.1 C 03/19/2018 11:50 AM  Pulse 65 03/19/2018 11:58 AM  Resp 18 03/19/2018 11:58 AM  SpO2 96 % 03/19/2018 11:58 AM  Vitals shown include unvalidated device data.  Last Pain:  Vitals:   03/19/18 1150  TempSrc: Tympanic         Complications: No apparent anesthesia complications

## 2018-03-19 NOTE — Op Note (Signed)
[facility] Gastroenterology Patient Name: Nicole Walton Procedure Date: 03/19/2018 11:30 AM MRN: [id number] Account #: [id number] Date of Birth: 11-Nov-1931 Admit Type: Outpatient Age: 85 Room: [facility] ENDO ROOM 3 Gender: Female Note Status: Finalized Procedure:            Flexible Sigmoidoscopy Indications:          High risk colon cancer surveillance: Personal history                        of rectal cancer, s/p complete endoscopic resection Providers:            Lin Landsman MD, MD Referring MD:         Tracie Harrier, MD (Referring MD) Complications:        No immediate complications. Estimated blood loss: None. Procedure:            Pre-Anesthesia Assessment:                       - Prior to the procedure, a History and Physical was                        performed, and patient medications and allergies were                        reviewed. The patient is competent. The risks and                        benefits of the procedure and the sedation options and                        risks were discussed with the patient. All questions                        were answered and informed consent was obtained.                        Patient identification and proposed procedure were                        verified by the physician, the nurse, the                        anesthesiologist, the anesthetist and the technician in                        the pre-procedure area in the procedure room in the                        endoscopy suite. Mental Status Examination: alert and                        oriented. Airway Examination: normal oropharyngeal                        airway and neck mobility. Respiratory Examination:                        clear to auscultation. CV Examination: normal.  Prophylactic Antibiotics: The patient does not require                        prophylactic antibiotics. Prior Anticoagulants: The   patient has taken Plavix (clopidogrel) and coumadin,                        last dose was 7 days prior to procedure. ASA Grade                        Assessment: III - A patient with severe systemic                        disease. After reviewing the risks and benefits, the                        patient was deemed in satisfactory condition to undergo                        the procedure. The anesthesia plan was to use general                        anesthesia. Immediately prior to administration of                        medications, the patient was re-assessed for adequacy                        to receive sedatives. The heart rate, respiratory rate,                        oxygen saturations, blood pressure, adequacy of                        pulmonary ventilation, and response to care were                        monitored throughout the procedure. The physical status                        of the patient was re-assessed after the procedure.                       After obtaining informed consent, the scope was passed                        under direct vision. The Endoscope was introduced                        through the anus and advanced to the the rectum. The                        flexible sigmoidoscopy was accomplished without                        difficulty. The patient tolerated the procedure well. Findings:      The perianal and digital rectal examinations were normal. Pertinent       negatives include normal sphincter tone and no palpable rectal lesions.      The  entire examined colon appeared normal.      A 5 mm post polypectomy scar was found in the mid rectum. The scar       tissue was healthy in appearance. There was no evidence of the previous       polyp. Impression:           - The entire examined colon is normal.                       - No specimens collected. Recommendation:       - Discharge patient to home (with escort).                       - Resume previous  diet today.                       - Continue present medications.                       - Colon cancer surveillance in 1 year based on her                        overall medical condition at that time                       - Resume Coumadin (warfarin) today and Plavix                        (clopidogrel) today at prior doses. Refer to managing                        physician for further adjustment of therapy. Procedure Code(s):    --- Professional ---                       G0104, 52, Colorectal cancer screening; flexible                        sigmoidoscopy Diagnosis Code(s):    --- Professional ---                       Z85.048, Personal history of other malignant neoplasm                        of rectum, rectosigmoid junction, and anus CPT copyright 2018 [employer]. All rights reserved. The codes documented in this report are preliminary and upon coder review may  be revised to meet current compliance requirements. Dr. Ulyess Mort Lin Landsman MD, MD 03/19/2018 11:56:47 AM This report has been signed electronically. Number of Addenda: 0 Note Initiated On: 03/19/2018 11:30 AM Total Procedure Duration: 0 hours 1 minute 39 seconds       [facility]

## 2018-03-19 NOTE — H&P (Signed)
Cephas Darby, MD [address]  [place]  [place],  [address]  Main: [phone]  Fax: [phone] Pager: [phone]  Primary Care Physician:  Tracie Harrier, MD Primary Gastroenterologist:  Dr. Cephas Darby  Pre-Procedure History & Physical: HPI:  ANALIA ZUK is a 85 y.o. female is here for an flexible sigmoidoscopy.   Past Medical History:  Diagnosis Date  . Atrial fibrillation ([place])   . Dysrhythmia    A-fib  . Edema    feet/legs  . Hypothyroidism   . Incontinence   . Shortness of breath dyspnea    with exersion  . Sleep apnea   . Stroke ([place])   . TIA (transient ischemic attack)   . TIA (transient ischemic attack)     Past Surgical History:  Procedure Laterality Date  . CATARACT EXTRACTION W/PHACO Right 04/20/2015   Procedure: CATARACT EXTRACTION PHACO AND INTRAOCULAR LENS PLACEMENT (IOC);  Surgeon: Leandrew Koyanagi, MD;  Location: [facility] ORS;  Service: Ophthalmology;  Laterality: Right;  [place]   1:12.1 AP%  16.2 CDE   11.69 fluid cassette lot# [id number]   exp.07/04/2016  . CATARACT EXTRACTION W/PHACO Left 06/01/2015   Procedure: CATARACT EXTRACTION PHACO AND INTRAOCULAR LENS PLACEMENT (IOC);  Surgeon: Leandrew Koyanagi, MD;  Location: [facility] ORS;  Service: Ophthalmology;  Laterality: Left;  [place] 1.17 AP% 9.9 CDE 765 Fluid Pack Lot # [id number]  . CESAREAN SECTION    . CHOLECYSTECTOMY    . COLONOSCOPY    . COLONOSCOPY WITH PROPOFOL N/A 07/14/2017   Procedure: COLONOSCOPY WITH PROPOFOL;  Surgeon: Lin Landsman, MD;  Location: [facility] ENDOSCOPY;  Service: Gastroenterology;  Laterality: N/A;  . COLONOSCOPY WITH PROPOFOL N/A 07/18/2017   Procedure: COLONOSCOPY WITH PROPOFOL;  Surgeon: Lin Landsman, MD;  Location: [facility] ENDOSCOPY;  Service: Gastroenterology;  Laterality: N/A;  . EUS N/A 09/11/2017   Procedure: LOWER ENDOSCOPIC ULTRASOUND (EUS);  Surgeon: Holly Bodily, MD;  Location: [facility] ENDOSCOPY;  Service: Gastroenterology;   Laterality: N/A;  . FOOT SURGERY      Prior to Admission medications   Medication Sig Start Date End Date Taking? Authorizing Provider  atenolol (TENORMIN) 25 MG tablet Take 1 tablet by mouth daily. 11/04/16  Yes [provider]  Calcium-Magnesium-Vitamin D (CALCIUM 500 PO) Take 1 tablet by mouth daily.   Yes [provider]  docusate sodium (COLACE) 100 MG capsule Take 100 mg by mouth 2 (two) times daily as needed for mild constipation.   Yes [provider]  HYDROcodone-acetaminophen (NORCO/VICODIN) 5-325 MG tablet Take by mouth. 03/08/16  Yes [provider]  levothyroxine (SYNTHROID, LEVOTHROID) 50 MCG tablet Take 50 mcg by mouth daily before breakfast.   Yes [provider]  levothyroxine (SYNTHROID, LEVOTHROID) 75 MCG tablet Take by mouth. 04/02/17 04/02/18 Yes [provider]  Cetirizine HCl (ZYRTEC ALLERGY) 10 MG CAPS Take 1 tablet by mouth daily.    [provider]  clopidogrel (PLAVIX) 75 MG tablet Take by mouth. 01/09/17   [provider]  enoxaparin (LOVENOX) 100 MG/ML injection Inject 0.95 mLs (95 mg total) into the skin every 12 (twelve) hours. Patient not taking: Reported on 03/19/2018 07/14/17   Fritzi Mandes, MD  furosemide (LASIX) 20 MG tablet Take 80 mg by mouth daily.  11/17/16   [provider]  furosemide (LASIX) 40 MG tablet  06/13/17   [provider]  ketoconazole (NIZORAL) 2 % cream Apply 1 application topically daily. 11/07/16   [provider]  KRILL OIL PO Take 1  tablet by mouth as needed.    [provider]  Menthol-Zinc Oxide (CALMOSEPTINE) 0.44-20.6 % OINT Apply 1 application topically daily. 11/07/16   [provider]  nitroGLYCERIN (NITROSTAT) 0.4 MG SL tablet Place 0.4 mg under the tongue every 5 (five) minutes as needed for chest pain.    [provider]  oxybutynin (DITROPAN) 5 MG tablet Take 5 mg by mouth 3 (three) times daily.    [provider]  oxyCODONE-acetaminophen (PERCOCET) 5-325 MG tablet Take 1 tablet by mouth every 4 (four) hours as needed for severe pain. Patient not taking: Reported on 03/19/2018 08/31/17 08/31/18  Gregor Hams, MD  triamcinolone cream (KENALOG) 0.1 % Apply 1 application topically 2 (two) times daily.    [provider]  vitamin B-12 (CYANOCOBALAMIN) 500 MCG tablet Take 1 tablet by mouth 2 (two) times daily.    [provider]  warfarin (COUMADIN) 4 MG tablet TAKE 1 TABLET (4MG ) DAILY MONDAY THROUGH FRIDAY. (TAKE 5MG  ON SATURDAY AND SUNDAY AS DIRECTED) 04/22/17   [provider]  warfarin (COUMADIN) 5 MG tablet TAKE 1 TABLET AS DIRECTED 05/06/17   [provider]    Allergies as of 10/14/2017 - Review Complete 09/11/2017  Allergen Reaction Noted  . Ciprofloxacin Other (See Comments) 04/20/2015  . Contrast media [iodinated diagnostic agents] Swelling 04/13/2015  . Iodine Other (See Comments) 04/30/2013  . Nitrofurantoin Other (See Comments) 04/30/2013  . Penicillin g Other (See Comments) 04/30/2013    Family History  Problem Relation Age of Onset  . Atrial fibrillation Son   . Diabetes Daughter   . Breast cancer Neg Hx     Social History   Socioeconomic History  . Marital status: Married    Spouse name: Not on file  . Number of children: Not on file  . Years of education: Not on file  . Highest education level: Not on file  Occupational History  . Occupation: retired  Scientific laboratory technician  . Financial resource strain: Not hard at all  . Food insecurity:    Worry: Never true    Inability: Never true  . Transportation needs:    Medical: No    Non-medical: No  Tobacco Use  . Smoking status: Never Smoker  . Smokeless tobacco: Never Used  Substance and Sexual Activity  . Alcohol use: No  . Drug use: No  . Sexual activity: Not on file  Lifestyle  . Physical activity:    Days per week: 5 days    Minutes per session: 20 min  . Stress: Not at  all  Relationships  . Social connections:    Talks on phone: Three times a week    Gets together: Three times a week    Attends religious service: 1 to 4 times per year    Active member of club or organization: No    Attends meetings of clubs or organizations: Never    Relationship status: Married  . Intimate partner violence:    Fear of current or ex partner: No    Emotionally abused: No    Physically abused: No    Forced sexual activity: No  Other Topics Concern  . Not on file  Social History Narrative   Live at home with spouse, do regular yard work daily    Review of Systems: See HPI, otherwise negative ROS  Physical Exam: BP (!) 142/66   Pulse 61   Temp (!) 97 F (36.1 C) (Tympanic)   Resp 18  Ht 5\' 3"  (1.6 m)   Wt 92.5 kg   SpO2 96%   BMI 36.14 kg/m  General:   Alert,  pleasant and cooperative in NAD Head:  Normocephalic and atraumatic. Neck:  Supple; no masses or thyromegaly. Lungs:  Clear throughout to auscultation.    Heart:  Regular rate and rhythm. Abdomen:  Soft, nontender and nondistended. Normal bowel sounds, without guarding, and without rebound.   Neurologic:  Alert and  oriented x4;  grossly normal neurologically.  Impression/Plan: JANETT KAMATH is here for an flexible sigmoidoscopy to be performed for rectal cancer surveillance  Risks, benefits, limitations, and alternatives regarding  flexible sigmoidoscopy have been reviewed with the patient.  Questions have been answered.  All parties agreeable.   Sherri Sear, MD  03/19/2018, 12:23 PM

## 2018-03-19 NOTE — Anesthesia Post-op Follow-up Note (Signed)
Anesthesia QCDR form completed.        

## 2018-03-19 NOTE — Anesthesia Preprocedure Evaluation (Signed)
Anesthesia Evaluation  Patient identified by MRN, date of birth, ID band Patient awake    Reviewed: Allergy & Precautions, H&P , NPO status , Patient's Chart, lab work & pertinent test results  History of Anesthesia Complications Negative for: history of anesthetic complications  Airway Mallampati: III  TM Distance: >3 FB Neck ROM: limited    Dental  (+) Poor Dentition, Chipped, Missing, Upper Dentures, Lower Dentures   Pulmonary shortness of breath and with exertion, sleep apnea and Continuous Positive Airway Pressure Ventilation , neg COPD, neg recent URI,  Pulmonary HTN          Cardiovascular Exercise Tolerance: Good (-) hypertension(-) angina(-) CAD, (-) Past MI, (-) Cardiac Stents, (-) CABG and (-) DOE + dysrhythmias Atrial Fibrillation (-) Valvular Problems/Murmurs     Neuro/Psych neg Seizures TIACVA negative psych ROS   GI/Hepatic negative GI ROS, Neg liver ROS, neg GERD  ,  Endo/Other  neg diabetesHypothyroidism   Renal/GU negative Renal ROS  negative genitourinary   Musculoskeletal   Abdominal   Peds  Hematology negative hematology ROS (+)   Anesthesia Other Findings Past Medical History:   Sleep apnea                                                  Shortness of breath dyspnea                                    Comment:with exersion   Dysrhythmia                                                    Comment:A-fib   Stroke (HCC)                                                 TIA (transient ischemic attack)                              Hypothyroidism                                              Past Surgical History:   CESAREAN SECTION                                              CHOLECYSTECTOMY                                               FOOT SURGERY  BMI    Body Mass Index   38.62 kg/m 2      Reproductive/Obstetrics negative OB ROS                              Anesthesia Physical  Anesthesia Plan  ASA: III  Anesthesia Plan: General   Post-op Pain Management:    Induction: Intravenous  PONV Risk Score and Plan: 3 and Propofol infusion and TIVA  Airway Management Planned: Nasal Cannula and Natural Airway  Additional Equipment:   Intra-op Plan:   Post-operative Plan:   Informed Consent: I have reviewed the patients History and Physical, chart, labs and discussed the procedure including the risks, benefits and alternatives for the proposed anesthesia with the patient or authorized representative who has indicated his/her understanding and acceptance.     Dental Advisory Given  Plan Discussed with: Anesthesiologist, CRNA and Surgeon  Anesthesia Plan Comments:         Anesthesia Quick Evaluation

## 2018-03-20 ENCOUNTER — Encounter: Payer: Self-pay | Admitting: Gastroenterology

## 2018-03-20 NOTE — Anesthesia Postprocedure Evaluation (Signed)
Anesthesia Post Note  Patient: Nicole Walton  Procedure(s) Performed: FLEXIBLE SIGMOIDOSCOPY (N/A )  Patient location during evaluation: Endoscopy Anesthesia Type: General Level of consciousness: awake and alert Pain management: pain level controlled Vital Signs Assessment: post-procedure vital signs reviewed and stable Respiratory status: spontaneous breathing, nonlabored ventilation, respiratory function stable and patient connected to nasal cannula oxygen Cardiovascular status: blood pressure returned to baseline and stable Postop Assessment: no apparent nausea or vomiting Anesthetic complications: no     Last Vitals:  Vitals:   03/19/18 1210 03/19/18 1220  BP: (!) 161/79 (!) 143/119  Pulse: 69 69  Resp: 19 (!) 23  Temp:    SpO2: 97% 98%    Last Pain:  Vitals:   03/19/18 1150  TempSrc: Tympanic                 Martha Clan

## 2018-04-07 DIAGNOSIS — Z7901 Long term (current) use of anticoagulants: Secondary | ICD-10-CM | POA: Diagnosis not present

## 2018-04-07 DIAGNOSIS — I4891 Unspecified atrial fibrillation: Secondary | ICD-10-CM | POA: Diagnosis not present

## 2018-04-11 ENCOUNTER — Emergency Department: Payer: Medicare HMO

## 2018-04-11 ENCOUNTER — Emergency Department
Admission: EM | Admit: 2018-04-11 | Discharge: 2018-04-11 | Disposition: A | Discharge status: Home / Self Care | Payer: Medicare HMO | Attending: Emergency Medicine | Admitting: Emergency Medicine

## 2018-04-11 ENCOUNTER — Encounter: Payer: Self-pay | Admitting: Emergency Medicine

## 2018-04-11 DIAGNOSIS — Z8673 Personal history of transient ischemic attack (TIA), and cerebral infarction without residual deficits: Secondary | ICD-10-CM | POA: Diagnosis not present

## 2018-04-11 DIAGNOSIS — Z79899 Other long term (current) drug therapy: Secondary | ICD-10-CM | POA: Insufficient documentation

## 2018-04-11 DIAGNOSIS — Z7901 Long term (current) use of anticoagulants: Secondary | ICD-10-CM | POA: Insufficient documentation

## 2018-04-11 DIAGNOSIS — I8393 Asymptomatic varicose veins of bilateral lower extremities: Secondary | ICD-10-CM | POA: Insufficient documentation

## 2018-04-11 DIAGNOSIS — M79605 Pain in left leg: Secondary | ICD-10-CM | POA: Diagnosis not present

## 2018-04-11 DIAGNOSIS — E039 Hypothyroidism, unspecified: Secondary | ICD-10-CM | POA: Insufficient documentation

## 2018-04-11 DIAGNOSIS — I83812 Varicose veins of left lower extremities with pain: Secondary | ICD-10-CM | POA: Diagnosis not present

## 2018-04-11 NOTE — ED Notes (Signed)
Discussed patient with Dr. Joni Fears.  Dr. Joni Fears felt patient was appropriate for flex care.

## 2018-04-11 NOTE — ED Provider Notes (Signed)
[facility] Emergency Department Provider Note  ____________________________________________  Time seen: Approximately 6:33 PM  I have reviewed the triage vital signs and the nursing notes.   HISTORY  Chief Complaint Varicose Veins    HPI Nicole Walton is a 85 y.o. female who presents the emergency department complaining of left lower extremity pain.  Patient reports that she has had a throbbing sensation in the region of her varicose veins.  Patient denies any trauma to the extremity.  She denies any gross erythema to the extremity.  Patient does have chronic edema but states that there is no significant changes from normal..  Patient is concerned as she has a history of A. fib and is on anticoagulation but is concerned that she may have a DVT.  No history of DVT, PE.  She denies any chest pain, shortness of breath, coughing.   Patient has not tried any medications for his complaint.  She does have a history of varicose veins and states that she has had no issues with her varicose veins.  Positive for history of A. fib, lower extremity edema, sleep apnea, CVA.  No complaints of chronic medical issues.        Past Medical History:  Diagnosis Date  . Atrial fibrillation ([place])   . Dysrhythmia    A-fib  . Edema    feet/legs  . Hypothyroidism   . Incontinence   . Shortness of breath dyspnea    with exersion  . Sleep apnea   . Stroke ([place])   . TIA (transient ischemic attack)   . TIA (transient ischemic attack)     Patient Active Problem List   Diagnosis Date Noted  . Rectal cancer ([place])   . Hx of colonic polyps   . Hematochezia 07/13/2017  . Patient is Jehovah's Witness 07/13/2017  . Atrial fibrillation, chronic 07/13/2017  . CVA (cerebral vascular accident) ([place]) 07/13/2017  . Hypothyroidism 07/13/2017  . OSA (obstructive sleep apnea) 07/13/2017  . TIA (transient ischemic attack) 02/25/2016  . UTI (urinary tract infection) 02/25/2016     Past Surgical History:  Procedure Laterality Date  . CATARACT EXTRACTION W/PHACO Right 04/20/2015   Procedure: CATARACT EXTRACTION PHACO AND INTRAOCULAR LENS PLACEMENT (IOC);  Surgeon: Leandrew Koyanagi, MD;  Location: [facility] ORS;  Service: Ophthalmology;  Laterality: Right;  [place]   1:12.1 AP%  16.2 CDE   11.69 fluid cassette lot# [id number]   exp.07/04/2016  . CATARACT EXTRACTION W/PHACO Left 06/01/2015   Procedure: CATARACT EXTRACTION PHACO AND INTRAOCULAR LENS PLACEMENT (IOC);  Surgeon: Leandrew Koyanagi, MD;  Location: [facility] ORS;  Service: Ophthalmology;  Laterality: Left;  [place] 1.17 AP% 9.9 CDE 765 Fluid Pack Lot # [id number]  . CESAREAN SECTION    . CHOLECYSTECTOMY    . COLONOSCOPY    . COLONOSCOPY WITH PROPOFOL N/A 07/14/2017   Procedure: COLONOSCOPY WITH PROPOFOL;  Surgeon: Lin Landsman, MD;  Location: [facility] ENDOSCOPY;  Service: Gastroenterology;  Laterality: N/A;  . COLONOSCOPY WITH PROPOFOL N/A 07/18/2017   Procedure: COLONOSCOPY WITH PROPOFOL;  Surgeon: Lin Landsman, MD;  Location: [facility] ENDOSCOPY;  Service: Gastroenterology;  Laterality: N/A;  . EUS N/A 09/11/2017   Procedure: LOWER ENDOSCOPIC ULTRASOUND (EUS);  Surgeon: Holly Bodily, MD;  Location: [facility] ENDOSCOPY;  Service: Gastroenterology;  Laterality: N/A;  . FLEXIBLE SIGMOIDOSCOPY N/A 03/19/2018   Procedure: FLEXIBLE SIGMOIDOSCOPY;  Surgeon: Lin Landsman, MD;  Location: [facility] ENDOSCOPY;  Service: Gastroenterology;  Laterality: N/A;  . FOOT SURGERY      Prior  to Admission medications   Medication Sig Start Date End Date Taking? Authorizing Provider  atenolol (TENORMIN) 25 MG tablet Take 1 tablet by mouth daily. 11/04/16   [provider]  Calcium-Magnesium-Vitamin D (CALCIUM 500 PO) Take 1 tablet by mouth daily.    [provider]  Cetirizine HCl (ZYRTEC ALLERGY) 10 MG CAPS Take 1 tablet by mouth daily.    [provider]  clopidogrel (PLAVIX) 75 MG tablet Take by mouth.  01/09/17   [provider]  docusate sodium (COLACE) 100 MG capsule Take 100 mg by mouth 2 (two) times daily as needed for mild constipation.    [provider]  furosemide (LASIX) 20 MG tablet Take 80 mg by mouth daily.  11/17/16   [provider]  furosemide (LASIX) 40 MG tablet  06/13/17   [provider]  HYDROcodone-acetaminophen (NORCO/VICODIN) 5-325 MG tablet Take by mouth. 03/08/16   [provider]  ketoconazole (NIZORAL) 2 % cream Apply 1 application topically daily. 11/07/16   [provider]  KRILL OIL PO Take 1 tablet by mouth as needed.    [provider]  levothyroxine (SYNTHROID, LEVOTHROID) 50 MCG tablet Take 50 mcg by mouth daily before breakfast.    [provider]  levothyroxine (SYNTHROID, LEVOTHROID) 75 MCG tablet Take by mouth. 04/02/17 04/02/18  [provider]  Menthol-Zinc Oxide (CALMOSEPTINE) 0.44-20.6 % OINT Apply 1 application topically daily. 11/07/16   [provider]  nitroGLYCERIN (NITROSTAT) 0.4 MG SL tablet Place 0.4 mg under the tongue every 5 (five) minutes as needed for chest pain.    [provider]  oxybutynin (DITROPAN) 5 MG tablet Take 5 mg by mouth 3 (three) times daily.    [provider]  oxyCODONE-acetaminophen (PERCOCET) 5-325 MG tablet Take 1 tablet by mouth every 4 (four) hours as needed for severe pain. Patient not taking: Reported on 03/19/2018 08/31/17 08/31/18  Gregor Hams, MD  triamcinolone cream (KENALOG) 0.1 % Apply 1 application topically 2 (two) times daily.    [provider]  vitamin B-12 (CYANOCOBALAMIN) 500 MCG tablet Take 1 tablet by mouth 2 (two) times daily.    [provider]  warfarin (COUMADIN) 4 MG tablet TAKE 1 TABLET (4MG ) DAILY MONDAY THROUGH FRIDAY. (TAKE 5MG  ON SATURDAY AND SUNDAY AS DIRECTED) 04/22/17   [provider]  warfarin (COUMADIN) 5 MG tablet TAKE 1 TABLET AS DIRECTED 05/06/17   [provider]    Allergies Ciprofloxacin; Contrast media [iodinated diagnostic agents]; Iodine; Nitrofurantoin; and Penicillin g  Family History  Problem Relation Age of Onset  . Atrial fibrillation Son   . Diabetes Daughter   . Breast cancer Neg Hx     Social History Social History   Tobacco Use  . Smoking status: Never Smoker  . Smokeless tobacco: Never Used  Substance Use Topics  . Alcohol use: No  . Drug use: No     Review of Systems  Constitutional: No fever/chills Eyes: No visual changes.  Cardiovascular: no chest pain. Respiratory: no cough. No SOB. Gastrointestinal: No abdominal pain.  No nausea, no vomiting.   Musculoskeletal: Positive for left lower extremity pain Skin: Negative for rash, abrasions, lacerations, ecchymosis. Neurological: Negative for headaches, focal weakness or numbness. 10-point ROS otherwise negative.  ____________________________________________   PHYSICAL EXAM:  VITAL SIGNS: ED Triage Vitals [04/11/18 1650]  Enc Vitals Group     BP      Pulse      Resp  Temp      Temp src      SpO2      Weight 204 lb (92.5 kg)     Height 5\' 3"  (1.6 m)     Head Circumference      Peak Flow      Pain Score 0     Pain Loc      Pain Edu?      Excl. in [place]?      Constitutional: Alert and oriented. Well appearing and in no acute distress. Eyes: Conjunctivae are normal. PERRL. EOMI. Head: Atraumatic. Neck: No stridor.    Cardiovascular: Normal rate, regular rhythm. Normal S1 and S2.  Good peripheral circulation. Respiratory: Normal respiratory effort without tachypnea or retractions. Lungs CTAB. Good air entry to the bases with no decreased or absent breath sounds. Musculoskeletal: Full range of motion to all extremities. No gross deformities appreciated.  Visualization of bilateral lower extremity reveals no gross changes and edema between the 2 lower legs.  Patient does have chronic edema, states that they are baseline.  3+ edema at  this time.  No gross erythema.  Patient does have appreciable varicose veins.  Palpation along 1 varicose vein elicits similar symptoms that brought patient in.  Dorsalis pedis pulse intact bilateral lower extremities.  Sensation intact and equal bilateral lower extremities. Neurologic:  Normal speech and language. No gross focal neurologic deficits are appreciated.  Skin:  Skin is warm, dry and intact. No rash noted. Psychiatric: Mood and affect are normal. Speech and behavior are normal. Patient exhibits appropriate insight and judgement.   ____________________________________________   LABS (all labs ordered are listed, but only abnormal results are displayed)  Labs Reviewed - No data to display ____________________________________________  EKG   ____________________________________________  RADIOLOGY I personally viewed and evaluated these images as part of my medical decision making, as well as reviewing the written report by the radiologist.  US Venous Img Lower Unilateral Left  Result Date: 04/11/2018 CLINICAL DATA:  Left leg pain EXAM: LEFT LOWER EXTREMITY VENOUS DOPPLER ULTRASOUND TECHNIQUE: Gray-scale sonography with graded compression, as well as color Doppler and duplex ultrasound were performed to evaluate the lower extremity deep venous systems from the level of the common femoral vein and including the common femoral, femoral, profunda femoral, popliteal and calf veins including the posterior tibial, peroneal and gastrocnemius veins when visible. The superficial great saphenous vein was also interrogated. Spectral Doppler was utilized to evaluate flow at rest and with distal augmentation maneuvers in the common femoral, femoral and popliteal veins. COMPARISON:  None. FINDINGS: Contralateral Common Femoral Vein: Respiratory phasicity is normal and symmetric with the symptomatic side. No evidence of thrombus. Normal compressibility. Common Femoral Vein: No evidence of thrombus.  Normal compressibility, respiratory phasicity and response to augmentation. Saphenofemoral Junction: No evidence of thrombus. Normal compressibility and flow on color Doppler imaging. Profunda Femoral Vein: No evidence of thrombus. Normal compressibility and flow on color Doppler imaging. Femoral Vein: No evidence of thrombus. Normal compressibility, respiratory phasicity and response to augmentation. Popliteal Vein: No evidence of thrombus. Normal compressibility, respiratory phasicity and response to augmentation. Calf Veins: No evidence of thrombus. Normal compressibility and flow on color Doppler imaging. Superficial Great Saphenous Vein: No evidence of thrombus. Normal compressibility. Venous Reflux:  None. Other Findings:  None. IMPRESSION: No evidence of deep venous thrombosis. Electronically Signed   By: Rolm Baptise M.D.   On: 04/11/2018 19:50    ____________________________________________     PROCEDURES  Procedure(s) performed:  Procedures    Medications - No data to display   ____________________________________________   INITIAL IMPRESSION / ASSESSMENT AND PLAN / ED COURSE  Pertinent labs & imaging results that were available during my care of the patient were reviewed by me and considered in my medical decision making (see chart for details).  Review of the [place] CSRS was performed in accordance of the [place] prior to dispensing any controlled drugs.           Patient's diagnosis is consistent with pain in the left lower extremity, varicose veins of bilateral lower extremities.  Patient presented to the emergency department with complaints of a throbbing pain underneath her varicose veins.  Overall exam is reassuring but given patient's medical history she was evaluated for DVT.  Ultrasound returns with no indication of acute thrombus.  No indication for further work-up at this time.  Patient may follow-up with primary care as needed.  No prescriptions at this time.   Patient is given ED precautions to return to the ED for any worsening or new symptoms.     ____________________________________________  FINAL CLINICAL IMPRESSION(S) / ED DIAGNOSES  Final diagnoses:  Pain of left lower extremity  Varicose veins of both lower extremities, unspecified whether complicated      NEW MEDICATIONS STARTED DURING THIS VISIT:  ED Discharge Orders    None          This chart was dictated using voice recognition software/Dragon. Despite best efforts to proofread, errors can occur which can change the meaning. Any change was purely unintentional.    Darletta Moll, PA-C 04/11/18 2016    Nance Pear, MD 04/11/18 2041

## 2018-04-11 NOTE — ED Notes (Signed)
Reference triage note. Pt states she has vein she is concerned about "popping". Pt states she is also on blood thinners.

## 2018-04-11 NOTE — ED Triage Notes (Signed)
Patient reports a vein in her left leg that she states feels like it's been, "swirling".  Patient denies pain.  Patient states vein is, "puffed out."  Patient is in no obvious distress at this time.

## 2018-07-06 DIAGNOSIS — C20 Malignant neoplasm of rectum: Secondary | ICD-10-CM | POA: Diagnosis not present

## 2018-07-06 DIAGNOSIS — G4733 Obstructive sleep apnea (adult) (pediatric): Secondary | ICD-10-CM | POA: Diagnosis not present

## 2018-07-06 DIAGNOSIS — R21 Rash and other nonspecific skin eruption: Secondary | ICD-10-CM | POA: Diagnosis not present

## 2018-07-06 DIAGNOSIS — Z7901 Long term (current) use of anticoagulants: Secondary | ICD-10-CM | POA: Diagnosis not present

## 2018-07-06 DIAGNOSIS — I482 Chronic atrial fibrillation, unspecified: Secondary | ICD-10-CM | POA: Diagnosis not present

## 2018-07-06 DIAGNOSIS — E039 Hypothyroidism, unspecified: Secondary | ICD-10-CM | POA: Diagnosis not present

## 2018-07-28 DIAGNOSIS — R0600 Dyspnea, unspecified: Secondary | ICD-10-CM | POA: Diagnosis not present

## 2018-07-28 DIAGNOSIS — R002 Palpitations: Secondary | ICD-10-CM | POA: Diagnosis not present

## 2018-07-28 DIAGNOSIS — J40 Bronchitis, not specified as acute or chronic: Secondary | ICD-10-CM | POA: Diagnosis not present

## 2018-07-28 DIAGNOSIS — R05 Cough: Secondary | ICD-10-CM | POA: Diagnosis not present

## 2018-07-28 DIAGNOSIS — I361 Nonrheumatic tricuspid (valve) insufficiency: Secondary | ICD-10-CM | POA: Diagnosis not present

## 2018-07-28 DIAGNOSIS — I10 Essential (primary) hypertension: Secondary | ICD-10-CM | POA: Diagnosis not present

## 2018-07-28 DIAGNOSIS — I071 Rheumatic tricuspid insufficiency: Secondary | ICD-10-CM | POA: Diagnosis not present

## 2018-07-28 DIAGNOSIS — I480 Paroxysmal atrial fibrillation: Secondary | ICD-10-CM | POA: Diagnosis not present

## 2018-08-06 DIAGNOSIS — Z7901 Long term (current) use of anticoagulants: Secondary | ICD-10-CM | POA: Diagnosis not present

## 2018-08-06 DIAGNOSIS — R21 Rash and other nonspecific skin eruption: Secondary | ICD-10-CM | POA: Diagnosis not present

## 2018-08-20 ENCOUNTER — Encounter: Payer: Self-pay | Admitting: Emergency Medicine

## 2018-08-20 ENCOUNTER — Emergency Department
Admission: EM | Admit: 2018-08-20 | Discharge: 2018-08-20 | Disposition: A | Discharge status: Home / Self Care | Payer: Medicare HMO | Attending: Emergency Medicine | Admitting: Emergency Medicine

## 2018-08-20 ENCOUNTER — Other Ambulatory Visit: Payer: Self-pay

## 2018-08-20 DIAGNOSIS — Z79899 Other long term (current) drug therapy: Secondary | ICD-10-CM | POA: Diagnosis not present

## 2018-08-20 DIAGNOSIS — S80869A Insect bite (nonvenomous), unspecified lower leg, initial encounter: Secondary | ICD-10-CM | POA: Diagnosis not present

## 2018-08-20 DIAGNOSIS — Z7901 Long term (current) use of anticoagulants: Secondary | ICD-10-CM | POA: Insufficient documentation

## 2018-08-20 DIAGNOSIS — R21 Rash and other nonspecific skin eruption: Secondary | ICD-10-CM | POA: Diagnosis present

## 2018-08-20 DIAGNOSIS — Z8673 Personal history of transient ischemic attack (TIA), and cerebral infarction without residual deficits: Secondary | ICD-10-CM | POA: Diagnosis not present

## 2018-08-20 DIAGNOSIS — Y999 Unspecified external cause status: Secondary | ICD-10-CM | POA: Insufficient documentation

## 2018-08-20 DIAGNOSIS — L089 Local infection of the skin and subcutaneous tissue, unspecified: Secondary | ICD-10-CM | POA: Insufficient documentation

## 2018-08-20 DIAGNOSIS — S80861A Insect bite (nonvenomous), right lower leg, initial encounter: Secondary | ICD-10-CM | POA: Diagnosis not present

## 2018-08-20 DIAGNOSIS — Y929 Unspecified place or not applicable: Secondary | ICD-10-CM | POA: Insufficient documentation

## 2018-08-20 DIAGNOSIS — E039 Hypothyroidism, unspecified: Secondary | ICD-10-CM | POA: Insufficient documentation

## 2018-08-20 DIAGNOSIS — Y939 Activity, unspecified: Secondary | ICD-10-CM | POA: Diagnosis not present

## 2018-08-20 DIAGNOSIS — S80862A Insect bite (nonvenomous), left lower leg, initial encounter: Secondary | ICD-10-CM | POA: Diagnosis not present

## 2018-08-20 DIAGNOSIS — W57XXXA Bitten or stung by nonvenomous insect and other nonvenomous arthropods, initial encounter: Secondary | ICD-10-CM | POA: Insufficient documentation

## 2018-08-20 MED ORDER — CLINDAMYCIN HCL 300 MG PO CAPS: 300.0000 mg | ORAL_CAPSULE | Freq: Three times a day (TID) | ORAL | 0 refills | Status: AC

## 2018-08-20 MED ORDER — METHYLPREDNISOLONE 4 MG PO TBPK: ORAL_TABLET | ORAL | 0 refills | Status: DC

## 2018-08-20 NOTE — ED Notes (Signed)
See triage note  Presents with possible insect bites to lower legs  States she was working in the yard yesterday

## 2018-08-20 NOTE — ED Provider Notes (Signed)
[facility] Emergency Department Provider Note   ____________________________________________   First MD Initiated Contact with Patient 08/20/18 [phone]     (approximate)  I have reviewed the triage vital signs and the nursing notes.   HISTORY  Chief Complaint Rash    HPI Nicole Walton is a 85 y.o. female patient presents for rash, signs excoriation, and "scab" lesions on the lower extremities.  Patient states this occurred 2 days after doing yard work.  Patient states the rash is not itching at this time and is not painful.  No palliative measure for complaint.         Past Medical History:  Diagnosis Date  . Atrial fibrillation ([place])   . Dysrhythmia    A-fib  . Edema    feet/legs  . Hypothyroidism   . Incontinence   . Shortness of breath dyspnea    with exersion  . Sleep apnea   . Stroke ([place])   . TIA (transient ischemic attack)   . TIA (transient ischemic attack)     Patient Active Problem List   Diagnosis Date Noted  . Rectal cancer ([place])   . Hx of colonic polyps   . Hematochezia 07/13/2017  . Patient is Jehovah's Witness 07/13/2017  . Atrial fibrillation, chronic 07/13/2017  . CVA (cerebral vascular accident) ([place]) 07/13/2017  . Hypothyroidism 07/13/2017  . OSA (obstructive sleep apnea) 07/13/2017  . TIA (transient ischemic attack) 02/25/2016  . UTI (urinary tract infection) 02/25/2016    Past Surgical History:  Procedure Laterality Date  . CATARACT EXTRACTION W/PHACO Right 04/20/2015   Procedure: CATARACT EXTRACTION PHACO AND INTRAOCULAR LENS PLACEMENT (IOC);  Surgeon: Leandrew Koyanagi, MD;  Location: [facility] ORS;  Service: Ophthalmology;  Laterality: Right;  [place]   1:12.1 AP%  16.2 CDE   11.69 fluid cassette lot# [id number]   exp.07/04/2016  . CATARACT EXTRACTION W/PHACO Left 06/01/2015   Procedure: CATARACT EXTRACTION PHACO AND INTRAOCULAR LENS PLACEMENT (IOC);  Surgeon: Leandrew Koyanagi, MD;  Location: [facility] ORS;   Service: Ophthalmology;  Laterality: Left;  [place] 1.17 AP% 9.9 CDE 765 Fluid Pack Lot # [id number]  . CESAREAN SECTION    . CHOLECYSTECTOMY    . COLONOSCOPY    . COLONOSCOPY WITH PROPOFOL N/A 07/14/2017   Procedure: COLONOSCOPY WITH PROPOFOL;  Surgeon: Lin Landsman, MD;  Location: [facility] ENDOSCOPY;  Service: Gastroenterology;  Laterality: N/A;  . COLONOSCOPY WITH PROPOFOL N/A 07/18/2017   Procedure: COLONOSCOPY WITH PROPOFOL;  Surgeon: Lin Landsman, MD;  Location: [facility] ENDOSCOPY;  Service: Gastroenterology;  Laterality: N/A;  . EUS N/A 09/11/2017   Procedure: LOWER ENDOSCOPIC ULTRASOUND (EUS);  Surgeon: Holly Bodily, MD;  Location: [facility] ENDOSCOPY;  Service: Gastroenterology;  Laterality: N/A;  . FLEXIBLE SIGMOIDOSCOPY N/A 03/19/2018   Procedure: FLEXIBLE SIGMOIDOSCOPY;  Surgeon: Lin Landsman, MD;  Location: [facility] ENDOSCOPY;  Service: Gastroenterology;  Laterality: N/A;  . FOOT SURGERY      Prior to Admission medications   Medication Sig Start Date End Date Taking? Authorizing Provider  atenolol (TENORMIN) 25 MG tablet Take 1 tablet by mouth daily. 11/04/16   [provider]  Calcium-Magnesium-Vitamin D (CALCIUM 500 PO) Take 1 tablet by mouth daily.    [provider]  Cetirizine HCl (ZYRTEC ALLERGY) 10 MG CAPS Take 1 tablet by mouth daily.    [provider]  clindamycin (CLEOCIN) 300 MG capsule Take 1 capsule (300 mg total) by mouth 3 (three) times daily for 10 days. 08/20/18 08/30/18  Sable Feil, PA-C  clopidogrel (PLAVIX) 75 MG tablet Take by mouth. 01/09/17   [provider]  docusate sodium (COLACE) 100 MG capsule Take 100 mg by mouth 2 (two) times daily as needed for mild constipation.    [provider]  furosemide (LASIX) 20 MG tablet Take 80 mg by mouth daily.  11/17/16   [provider]  furosemide (LASIX) 40 MG tablet  06/13/17   [provider]  HYDROcodone-acetaminophen (NORCO/VICODIN) 5-325 MG  tablet Take by mouth. 03/08/16   [provider]  ketoconazole (NIZORAL) 2 % cream Apply 1 application topically daily. 11/07/16   [provider]  KRILL OIL PO Take 1 tablet by mouth as needed.    [provider]  levothyroxine (SYNTHROID, LEVOTHROID) 50 MCG tablet Take 50 mcg by mouth daily before breakfast.    [provider]  levothyroxine (SYNTHROID, LEVOTHROID) 75 MCG tablet Take by mouth. 04/02/17 04/02/18  [provider]  Menthol-Zinc Oxide (CALMOSEPTINE) 0.44-20.6 % OINT Apply 1 application topically daily. 11/07/16   [provider]  methylPREDNISolone (MEDROL DOSEPAK) 4 MG TBPK tablet Take Tapered dose as directed 08/20/18   Sable Feil, PA-C  nitroGLYCERIN (NITROSTAT) 0.4 MG SL tablet Place 0.4 mg under the tongue every 5 (five) minutes as needed for chest pain.    [provider]  oxybutynin (DITROPAN) 5 MG tablet Take 5 mg by mouth 3 (three) times daily.    [provider]  oxyCODONE-acetaminophen (PERCOCET) 5-325 MG tablet Take 1 tablet by mouth every 4 (four) hours as needed for severe pain. Patient not taking: Reported on 03/19/2018 08/31/17 08/31/18  Gregor Hams, MD  triamcinolone cream (KENALOG) 0.1 % Apply 1 application topically 2 (two) times daily.    [provider]  vitamin B-12 (CYANOCOBALAMIN) 500 MCG tablet Take 1 tablet by mouth 2 (two) times daily.    [provider]  warfarin (COUMADIN) 4 MG tablet TAKE 1 TABLET (4MG ) DAILY MONDAY THROUGH FRIDAY. (TAKE 5MG  ON SATURDAY AND SUNDAY AS DIRECTED) 04/22/17   [provider]  warfarin (COUMADIN) 5 MG tablet TAKE 1 TABLET AS DIRECTED 05/06/17   [provider]    Allergies Ciprofloxacin, Contrast media [iodinated diagnostic agents], Iodine, Nitrofurantoin, and Penicillin g  Family History  Problem Relation Age of Onset  . Atrial fibrillation Son   . Diabetes Daughter   . Breast cancer Neg Hx     Social History  Social History   Tobacco Use  . Smoking status: Never Smoker  . Smokeless tobacco: Never Used  Substance Use Topics  . Alcohol use: No  . Drug use: No    Review of Systems Constitutional: No fever/chills Eyes: No visual changes. ENT: No sore throat. Cardiovascular: Denies chest pain. Respiratory: Denies shortness of breath. Gastrointestinal: No abdominal pain.  No nausea, no vomiting.  No diarrhea.  No constipation. Genitourinary: Negative for dysuria. Musculoskeletal: Negative for back pain. Skin: Positive for rash. Neurological: Negative for headaches, focal weakness or numbness. Endocrine:  Hypothyroidism Allergic/Immunilogical: See allergy list. ____________________________________________   PHYSICAL EXAM:  VITAL SIGNS: ED Triage Vitals  Enc Vitals Group     BP 08/20/18 0523 140/72     Pulse Rate 08/20/18 0523 67     Resp 08/20/18 0523 20     Temp 08/20/18 0523 98.1 F (36.7 C)     Temp Source 08/20/18 0523 Oral     SpO2 08/20/18 0523 99 %     Weight 08/20/18 0522 184 lb (83.5 kg)  Height 08/20/18 0522 5\' 4"  (1.626 m)     Head Circumference --      Peak Flow --      Pain Score 08/20/18 0522 0     Pain Loc --      Pain Edu? --      Excl. in [place]? --     Constitutional: Alert and oriented. Well appearing and in no acute distress. Cardiovascular: Normal rate, regular rhythm. Grossly normal heart sounds.  Good peripheral circulation. Respiratory: Normal respiratory effort.  No retractions. Lungs CTAB. Musculoskeletal: No lower extremity tenderness nor edema.  No joint effusions. Neurologic:  Normal speech and language. No gross focal neurologic deficits are appreciated. No gait instability. Skin: Papular lesion on erythematous base lower extremities.  Signs excoriation.   Psychiatric: Mood and affect are normal. Speech and behavior are normal.  ____________________________________________   LABS (all labs ordered are listed, but only abnormal results are  displayed)  Labs Reviewed - No data to display ____________________________________________  EKG   ____________________________________________  RADIOLOGY  ED MD interpretation:    Official radiology report(s): No results found.  ____________________________________________   PROCEDURES  Procedure(s) performed (including Critical Care):  Procedures   ____________________________________________   INITIAL IMPRESSION / ASSESSMENT AND PLAN / ED COURSE  As part of my medical decision making, I reviewed the following data within the [place] was evaluated in Emergency Department on 08/20/2018 for the symptoms described in the history of present illness. She was evaluated in the context of the global COVID-19 pandemic, which necessitated consideration that the patient might be at risk for infection with the SARS-CoV-2 virus that causes COVID-19. Institutional protocols and algorithms that pertain to the evaluation of patients at risk for COVID-19 are in a state of rapid change based on information released by regulatory bodies including the CDC and federal and state organizations. These policies and algorithms were followed during the patient's care in the ED.   Patient presents for erythematous papular lesion lower extremities consistent with infected insect bites.  Patient given discharge care instruction advised take medication as directed.  Patient advised follow-up PCP in 1 week.  Return to ED if condition worsens.      ____________________________________________   FINAL CLINICAL IMPRESSION(S) / ED DIAGNOSES  Final diagnoses:  Bug bite with infection, initial encounter     ED Discharge Orders         Ordered    methylPREDNISolone (MEDROL DOSEPAK) 4 MG TBPK tablet     08/20/18 0716    clindamycin (CLEOCIN) 300 MG capsule  3 times daily     08/20/18 2426           Note:  This document was prepared using  Dragon voice recognition software and may include unintentional dictation errors.    Sable Feil, PA-C 08/20/18 Derinda Sis, MD 09/18/18 1029

## 2018-08-20 NOTE — ED Triage Notes (Signed)
Patient ambulatory to triage with steady gait, without difficulty or distress noted, mask in place; pt reports since yesterday has noted several scabbed areas to legs after "doing yard work"; denies itching, denies pain

## 2018-08-25 ENCOUNTER — Other Ambulatory Visit: Payer: Self-pay | Admitting: Internal Medicine

## 2018-08-25 DIAGNOSIS — Z1231 Encounter for screening mammogram for malignant neoplasm of breast: Secondary | ICD-10-CM

## 2018-09-14 DIAGNOSIS — Z7901 Long term (current) use of anticoagulants: Secondary | ICD-10-CM | POA: Diagnosis not present

## 2018-09-15 ENCOUNTER — Other Ambulatory Visit: Payer: Self-pay | Admitting: Internal Medicine

## 2018-09-15 DIAGNOSIS — Z1231 Encounter for screening mammogram for malignant neoplasm of breast: Secondary | ICD-10-CM

## 2018-10-15 ENCOUNTER — Ambulatory Visit
Admission: RE | Admit: 2018-10-15 | Discharge: 2018-10-15 | Disposition: A | Discharge status: Home / Self Care | Payer: Medicare HMO | Source: Ambulatory Visit | Attending: Internal Medicine | Admitting: Internal Medicine

## 2018-10-15 DIAGNOSIS — Z7901 Long term (current) use of anticoagulants: Secondary | ICD-10-CM | POA: Diagnosis not present

## 2018-10-15 DIAGNOSIS — Z1231 Encounter for screening mammogram for malignant neoplasm of breast: Secondary | ICD-10-CM | POA: Insufficient documentation

## 2018-10-19 ENCOUNTER — Emergency Department: Payer: Medicare HMO

## 2018-10-19 ENCOUNTER — Inpatient Hospital Stay
Admission: EM | Admit: 2018-10-19 | Discharge: 2018-10-21 | DRG: 948 | Disposition: A | Discharge status: Home / Self Care | Payer: Medicare HMO | Attending: Internal Medicine | Admitting: Internal Medicine

## 2018-10-19 ENCOUNTER — Other Ambulatory Visit: Payer: Self-pay

## 2018-10-19 DIAGNOSIS — R4182 Altered mental status, unspecified: Secondary | ICD-10-CM | POA: Diagnosis not present

## 2018-10-19 DIAGNOSIS — Z79890 Hormone replacement therapy (postmenopausal): Secondary | ICD-10-CM

## 2018-10-19 DIAGNOSIS — Z79891 Long term (current) use of opiate analgesic: Secondary | ICD-10-CM | POA: Diagnosis not present

## 2018-10-19 DIAGNOSIS — R7989 Other specified abnormal findings of blood chemistry: Secondary | ICD-10-CM | POA: Diagnosis present

## 2018-10-19 DIAGNOSIS — R29810 Facial weakness: Secondary | ICD-10-CM | POA: Diagnosis not present

## 2018-10-19 DIAGNOSIS — Z91041 Radiographic dye allergy status: Secondary | ICD-10-CM

## 2018-10-19 DIAGNOSIS — Z85048 Personal history of other malignant neoplasm of rectum, rectosigmoid junction, and anus: Secondary | ICD-10-CM | POA: Diagnosis not present

## 2018-10-19 DIAGNOSIS — Z8673 Personal history of transient ischemic attack (TIA), and cerebral infarction without residual deficits: Secondary | ICD-10-CM

## 2018-10-19 DIAGNOSIS — R32 Unspecified urinary incontinence: Secondary | ICD-10-CM | POA: Diagnosis present

## 2018-10-19 DIAGNOSIS — N179 Acute kidney failure, unspecified: Secondary | ICD-10-CM | POA: Diagnosis not present

## 2018-10-19 DIAGNOSIS — R112 Nausea with vomiting, unspecified: Secondary | ICD-10-CM | POA: Diagnosis not present

## 2018-10-19 DIAGNOSIS — Z86010 Personal history of colonic polyps: Secondary | ICD-10-CM

## 2018-10-19 DIAGNOSIS — Z833 Family history of diabetes mellitus: Secondary | ICD-10-CM

## 2018-10-19 DIAGNOSIS — Z23 Encounter for immunization: Secondary | ICD-10-CM | POA: Diagnosis not present

## 2018-10-19 DIAGNOSIS — I6782 Cerebral ischemia: Secondary | ICD-10-CM | POA: Diagnosis not present

## 2018-10-19 DIAGNOSIS — I4891 Unspecified atrial fibrillation: Secondary | ICD-10-CM | POA: Diagnosis not present

## 2018-10-19 DIAGNOSIS — Z7901 Long term (current) use of anticoagulants: Secondary | ICD-10-CM | POA: Diagnosis not present

## 2018-10-19 DIAGNOSIS — E039 Hypothyroidism, unspecified: Secondary | ICD-10-CM | POA: Diagnosis not present

## 2018-10-19 DIAGNOSIS — G4733 Obstructive sleep apnea (adult) (pediatric): Secondary | ICD-10-CM | POA: Diagnosis present

## 2018-10-19 DIAGNOSIS — R110 Nausea: Secondary | ICD-10-CM | POA: Diagnosis not present

## 2018-10-19 DIAGNOSIS — R109 Unspecified abdominal pain: Secondary | ICD-10-CM | POA: Diagnosis not present

## 2018-10-19 DIAGNOSIS — Z7952 Long term (current) use of systemic steroids: Secondary | ICD-10-CM

## 2018-10-19 DIAGNOSIS — I482 Chronic atrial fibrillation, unspecified: Secondary | ICD-10-CM | POA: Diagnosis present

## 2018-10-19 DIAGNOSIS — I4892 Unspecified atrial flutter: Secondary | ICD-10-CM | POA: Diagnosis not present

## 2018-10-19 DIAGNOSIS — Z20828 Contact with and (suspected) exposure to other viral communicable diseases: Secondary | ICD-10-CM | POA: Diagnosis not present

## 2018-10-19 DIAGNOSIS — Z888 Allergy status to other drugs, medicaments and biological substances status: Secondary | ICD-10-CM | POA: Diagnosis not present

## 2018-10-19 DIAGNOSIS — R2681 Unsteadiness on feet: Secondary | ICD-10-CM | POA: Diagnosis present

## 2018-10-19 DIAGNOSIS — Z03818 Encounter for observation for suspected exposure to other biological agents ruled out: Secondary | ICD-10-CM | POA: Diagnosis not present

## 2018-10-19 DIAGNOSIS — Z881 Allergy status to other antibiotic agents status: Secondary | ICD-10-CM | POA: Diagnosis not present

## 2018-10-19 DIAGNOSIS — F0390 Unspecified dementia without behavioral disturbance: Secondary | ICD-10-CM | POA: Diagnosis present

## 2018-10-19 DIAGNOSIS — Z880 Allergy status to penicillin: Secondary | ICD-10-CM | POA: Diagnosis not present

## 2018-10-19 DIAGNOSIS — G459 Transient cerebral ischemic attack, unspecified: Secondary | ICD-10-CM | POA: Diagnosis not present

## 2018-10-19 DIAGNOSIS — I10 Essential (primary) hypertension: Secondary | ICD-10-CM | POA: Diagnosis not present

## 2018-10-19 LAB — URINALYSIS, COMPLETE (UACMP) WITH MICROSCOPIC
Bacteria, UA: NONE SEEN
Bilirubin Urine: NEGATIVE
Glucose, UA: NEGATIVE mg/dL
Hgb urine dipstick: NEGATIVE
Ketones, ur: 20 mg/dL — AB
Leukocytes,Ua: NEGATIVE
Nitrite: NEGATIVE
Protein, ur: 30 mg/dL — AB
Specific Gravity, Urine: 1.025 (ref 1.005–1.030)
pH: 5 (ref 5.0–8.0)

## 2018-10-19 LAB — COMPREHENSIVE METABOLIC PANEL
ALT: 47 U/L — ABNORMAL HIGH (ref 0–44)
AST: 115 U/L — ABNORMAL HIGH (ref 15–41)
Albumin: 4.1 g/dL (ref 3.5–5.0)
Alkaline Phosphatase: 100 U/L (ref 38–126)
Anion gap: 12 (ref 5–15)
BUN: 20 mg/dL (ref 8–23)
CO2: 27 mmol/L (ref 22–32)
Calcium: 8.8 mg/dL — ABNORMAL LOW (ref 8.9–10.3)
Chloride: 100 mmol/L (ref 98–111)
Creatinine, Ser: 1.19 mg/dL — ABNORMAL HIGH (ref 0.44–1.00)
GFR calc Af Amer: 49 mL/min — ABNORMAL LOW (ref >60)
GFR calc non Af Amer: 42 mL/min — ABNORMAL LOW (ref >60)
Glucose, Bld: 124 mg/dL — ABNORMAL HIGH (ref 70–99)
Potassium: 3.9 mmol/L (ref 3.5–5.1)
Sodium: 139 mmol/L (ref 135–145)
Total Bilirubin: 1.7 mg/dL — ABNORMAL HIGH (ref 0.3–1.2)
Total Protein: 7.4 g/dL (ref 6.5–8.1)

## 2018-10-19 LAB — CBC WITH DIFFERENTIAL/PLATELET
Abs Immature Granulocytes: 0.01 10*3/uL (ref 0.00–0.07)
Basophils Absolute: 0 10*3/uL (ref 0.0–0.1)
Basophils Relative: 0 %
Eosinophils Absolute: 0.1 10*3/uL (ref 0.0–0.5)
Eosinophils Relative: 1 %
HCT: 44.1 % (ref 36.0–46.0)
Hemoglobin: 14.4 g/dL (ref 12.0–15.0)
Immature Granulocytes: 0 %
Lymphocytes Relative: 11 %
Lymphs Abs: 0.4 10*3/uL — ABNORMAL LOW (ref 0.7–4.0)
MCH: 30.4 pg (ref 26.0–34.0)
MCHC: 32.7 g/dL (ref 30.0–36.0)
MCV: 93.2 fL (ref 80.0–100.0)
Monocytes Absolute: 0.3 10*3/uL (ref 0.1–1.0)
Monocytes Relative: 9 %
Neutro Abs: 2.9 10*3/uL (ref 1.7–7.7)
Neutrophils Relative %: 79 %
Platelets: 166 10*3/uL (ref 150–400)
RBC: 4.73 MIL/uL (ref 3.87–5.11)
RDW: 14.6 % (ref 11.5–15.5)
WBC: 3.7 10*3/uL — ABNORMAL LOW (ref 4.0–10.5)
nRBC: 0 % (ref 0.0–0.2)

## 2018-10-19 LAB — GLUCOSE, CAPILLARY: Glucose-Capillary: 116 mg/dL — ABNORMAL HIGH (ref 70–99)

## 2018-10-19 LAB — SARS CORONAVIRUS 2 BY RT PCR (HOSPITAL ORDER, PERFORMED IN ~~LOC~~ HOSPITAL LAB): SARS Coronavirus 2: NEGATIVE

## 2018-10-19 MED ORDER — HYDROCODONE-ACETAMINOPHEN 5-325 MG PO TABS
1.0000 | ORAL_TABLET | Freq: Four times a day (QID) | ORAL | Status: DC | PRN
  Administered 2018-10-20: 1 via ORAL
  Filled 2018-10-19: qty 1

## 2018-10-19 MED ORDER — NITROGLYCERIN 0.4 MG SL SUBL: 0.4000 mg | SUBLINGUAL_TABLET | SUBLINGUAL | Status: DC | PRN

## 2018-10-19 MED ORDER — OXYBUTYNIN CHLORIDE 5 MG PO TABS
5.0000 mg | ORAL_TABLET | Freq: Two times a day (BID) | ORAL | Status: DC
  Administered 2018-10-20 – 2018-10-21 (×3): 5 mg via ORAL
  Filled 2018-10-19 (×4): qty 1

## 2018-10-19 MED ORDER — SODIUM CHLORIDE 0.9 % IV BOLUS
500.0000 mL | Freq: Once | INTRAVENOUS | Status: AC
  Administered 2018-10-19: 500 mL via INTRAVENOUS

## 2018-10-19 MED ORDER — LEVOTHYROXINE SODIUM 50 MCG PO TABS
75.0000 ug | ORAL_TABLET | Freq: Every day | ORAL | Status: DC
  Administered 2018-10-20 – 2018-10-21 (×2): 75 ug via ORAL
  Filled 2018-10-19: qty 1
  Filled 2018-10-19: qty 2

## 2018-10-19 MED ORDER — CYANOCOBALAMIN 500 MCG PO TABS
500.0000 ug | ORAL_TABLET | Freq: Two times a day (BID) | ORAL | Status: DC
  Administered 2018-10-20: 500 ug via ORAL
  Filled 2018-10-19 (×4): qty 1

## 2018-10-19 MED ORDER — MENTHOL-ZINC OXIDE 0.44-20.6 % EX OINT: 1.0000 "application " | TOPICAL_OINTMENT | Freq: Every day | CUTANEOUS | Status: DC

## 2018-10-19 MED ORDER — DOCUSATE SODIUM 100 MG PO CAPS
100.0000 mg | ORAL_CAPSULE | Freq: Two times a day (BID) | ORAL | Status: DC | PRN
  Administered 2018-10-20 – 2018-10-21 (×3): 100 mg via ORAL
  Filled 2018-10-19 (×3): qty 1

## 2018-10-19 MED ORDER — CLOPIDOGREL BISULFATE 75 MG PO TABS
75.0000 mg | ORAL_TABLET | Freq: Every day | ORAL | Status: DC
  Administered 2018-10-20: 75 mg via ORAL
  Filled 2018-10-19: qty 1

## 2018-10-19 MED ORDER — FUROSEMIDE 20 MG PO TABS: 20.0000 mg | ORAL_TABLET | Freq: Every day | ORAL | Status: DC | PRN

## 2018-10-19 MED ORDER — TRIAMCINOLONE ACETONIDE 0.1 % EX CREA
1.0000 "application " | TOPICAL_CREAM | Freq: Two times a day (BID) | CUTANEOUS | Status: DC
  Administered 2018-10-20: 1 via TOPICAL
  Filled 2018-10-19: qty 15

## 2018-10-19 MED ORDER — ATENOLOL 25 MG PO TABS
25.0000 mg | ORAL_TABLET | Freq: Every day | ORAL | Status: DC
  Administered 2018-10-20 – 2018-10-21 (×2): 25 mg via ORAL
  Filled 2018-10-19 (×2): qty 1

## 2018-10-19 MED ORDER — SODIUM CHLORIDE 0.9 % IV SOLN
Freq: Once | INTRAVENOUS | Status: AC
  Administered 2018-10-20: 01:00:00 via INTRAVENOUS

## 2018-10-19 MED ORDER — KETOCONAZOLE 2 % EX CREA
1.0000 "application " | TOPICAL_CREAM | Freq: Every day | CUTANEOUS | Status: DC
  Administered 2018-10-20 – 2018-10-21 (×2): 1 via TOPICAL
  Filled 2018-10-19: qty 15

## 2018-10-19 NOTE — ED Notes (Signed)
Pt leaving for MRI.  

## 2018-10-19 NOTE — ED Notes (Signed)
EKG to [place].

## 2018-10-19 NOTE — ED Provider Notes (Signed)
[facility] Emergency Department Provider Note    First MD Initiated Contact with Patient 10/19/18 1758     (approximate)  I have reviewed the triage vital signs and the nursing notes.   HISTORY  Chief Complaint Altered Mental Status  Level V Caveat:  Ams  HPI Nicole Walton is a 85 y.o. female presents the ER for acute on chronic confusion.  Patient lives at home with demented husband.  She is brought in by ER by her daughter who states that the patient's husband called her saying that she was not acting normally today.  Reportedly did have a fall several weeks ago.  Has had decreased oral intake for the past several days.  No measured fevers.  Patient denies any pain just feels weak and fatigued.  Does not wear home oxygen.  Is on anticoagulation for history of A. fib and stroke.    Past Medical History:  Diagnosis Date   Atrial fibrillation (HCC)    Dysrhythmia    A-fib   Edema    feet/legs   Hypothyroidism    Incontinence    Shortness of breath dyspnea    with exersion   Sleep apnea    Stroke (H[facility])    TIA (transient ischemic attack)    TIA (transient ischemic attack)    Family History  Problem Relation Age of Onset   Atrial fibrillation Son    Diabetes Daughter    Breast cancer Neg Hx    Past Surgical History:  Procedure Laterality Date   CATARACT EXTRACTION W/PHACO Right 04/20/2015   Procedure: CATARACT EXTRACTION PHACO AND INTRAOCULAR LENS PLACEMENT ([place]);  Surgeon: Leandrew Koyanagi, MD;  Location: [facility] ORS;  Service: Ophthalmology;  Laterality: Right;  [place]   1:12.1 AP%  16.2 CDE   11.69 fluid cassette lot# [id number]   exp.07/04/2016   CATARACT EXTRACTION W/PHACO Left 06/01/2015   Procedure: CATARACT EXTRACTION PHACO AND INTRAOCULAR LENS PLACEMENT ([place]);  Surgeon: Leandrew Koyanagi, MD;  Location: [facility] ORS;  Service: Ophthalmology;  Laterality: Left;  [place] 1.17 AP% 9.9 CDE 765 Fluid Pack Lot # [id number]   CESAREAN  SECTION     CHOLECYSTECTOMY     COLONOSCOPY     COLONOSCOPY WITH PROPOFOL N/A 07/14/2017   Procedure: COLONOSCOPY WITH PROPOFOL;  Surgeon: Lin Landsman, MD;  Location: [facility] ENDOSCOPY;  Service: Gastroenterology;  Laterality: N/A;   COLONOSCOPY WITH PROPOFOL N/A 07/18/2017   Procedure: COLONOSCOPY WITH PROPOFOL;  Surgeon: Lin Landsman, MD;  Location: [facility] ENDOSCOPY;  Service: Gastroenterology;  Laterality: N/A;   EUS N/A 09/11/2017   Procedure: LOWER ENDOSCOPIC ULTRASOUND (EUS);  Surgeon: Holly Bodily, MD;  Location: [facility] ENDOSCOPY;  Service: Gastroenterology;  Laterality: N/A;   FLEXIBLE SIGMOIDOSCOPY N/A 03/19/2018   Procedure: FLEXIBLE SIGMOIDOSCOPY;  Surgeon: Lin Landsman, MD;  Location: [facility] ENDOSCOPY;  Service: Gastroenterology;  Laterality: N/A;   FOOT SURGERY     Patient Active Problem List   Diagnosis Date Noted   Rectal cancer ([place])    Hx of colonic polyps    Hematochezia 07/13/2017   Patient is Jehovah's Witness 07/13/2017   Atrial fibrillation, chronic 07/13/2017   CVA (cerebral vascular accident) ([place]) 07/13/2017   Hypothyroidism 07/13/2017   OSA (obstructive sleep apnea) 07/13/2017   TIA (transient ischemic attack) 02/25/2016   UTI (urinary tract infection) 02/25/2016      Prior to Admission medications   Medication Sig Start Date End Date Taking? Authorizing Provider  atenolol (TENORMIN) 25 MG tablet Take 1 tablet  by mouth daily. 11/04/16   [provider]  Calcium-Magnesium-Vitamin D (CALCIUM 500 PO) Take 1 tablet by mouth daily.    [provider]  Cetirizine HCl (ZYRTEC ALLERGY) 10 MG CAPS Take 1 tablet by mouth daily.    [provider]  clopidogrel (PLAVIX) 75 MG tablet Take by mouth. 01/09/17   [provider]  docusate sodium (COLACE) 100 MG capsule Take 100 mg by mouth 2 (two) times daily as needed for mild constipation.    [provider]  furosemide (LASIX) 20 MG tablet Take  80 mg by mouth daily.  11/17/16   [provider]  furosemide (LASIX) 40 MG tablet  06/13/17   [provider]  HYDROcodone-acetaminophen (NORCO/VICODIN) 5-325 MG tablet Take by mouth. 03/08/16   [provider]  ketoconazole (NIZORAL) 2 % cream Apply 1 application topically daily. 11/07/16   [provider]  KRILL OIL PO Take 1 tablet by mouth as needed.    [provider]  levothyroxine (SYNTHROID, LEVOTHROID) 50 MCG tablet Take 50 mcg by mouth daily before breakfast.    [provider]  levothyroxine (SYNTHROID, LEVOTHROID) 75 MCG tablet Take by mouth. 04/02/17 04/02/18  [provider]  Menthol-Zinc Oxide (CALMOSEPTINE) 0.44-20.6 % OINT Apply 1 application topically daily. 11/07/16   [provider]  methylPREDNISolone (MEDROL DOSEPAK) 4 MG TBPK tablet Take Tapered dose as directed 08/20/18   Sable Feil, PA-C  nitroGLYCERIN (NITROSTAT) 0.4 MG SL tablet Place 0.4 mg under the tongue every 5 (five) minutes as needed for chest pain.    [provider]  oxybutynin (DITROPAN) 5 MG tablet Take 5 mg by mouth 3 (three) times daily.    [provider]  triamcinolone cream (KENALOG) 0.1 % Apply 1 application topically 2 (two) times daily.    [provider]  vitamin B-12 (CYANOCOBALAMIN) 500 MCG tablet Take 1 tablet by mouth 2 (two) times daily.    [provider]  warfarin (COUMADIN) 4 MG tablet TAKE 1 TABLET (4MG ) DAILY MONDAY THROUGH FRIDAY. (TAKE 5MG  ON SATURDAY AND SUNDAY AS DIRECTED) 04/22/17   [provider]  warfarin (COUMADIN) 5 MG tablet TAKE 1 TABLET AS DIRECTED 05/06/17   [provider]    Allergies Ciprofloxacin, Contrast media [iodinated diagnostic agents], Iodine, Nitrofurantoin, and Penicillin g    Social History Social History   Tobacco Use   Smoking status: Never Smoker   Smokeless tobacco: Never Used  Substance Use Topics   Alcohol use: No   Drug  use: No    Review of Systems Patient denies headaches, rhinorrhea, blurry vision, numbness, shortness of breath, chest pain, edema, cough, abdominal pain, nausea, vomiting, diarrhea, dysuria, fevers, rashes or hallucinations unless otherwise stated above in HPI. ____________________________________________   PHYSICAL EXAM:  VITAL SIGNS: Vitals:   10/19/18 1809 10/19/18 1956  BP: (!) 157/94 (!) 162/85  Pulse: 80 86  Resp: 20 20  Temp: 98.3 F (36.8 C)   SpO2: 95% 97%    Constitutional: Alert and answers questions and follows commands, drowsy and fatigued appearing Eyes: Conjunctivae are normal.  Head: Atraumatic. Nose: No congestion/rhinnorhea. Mouth/Throat: Mucous membranes are moist.   Neck: No stridor. Painless ROM.  Cardiovascular: Normal rate, regular rhythm. Grossly normal heart sounds.  Good peripheral circulation. Respiratory: Normal respiratory effort.  No retractions. Lungs CTAB. Gastrointestinal: Soft and nontender. No distention. No abdominal bruits. No CVA tenderness. Genitourinary:  Musculoskeletal: No lower extremity tenderness nor edema.  No joint effusions. Neurologic:  Normal speech  and language. No gross focal neurologic deficits are appreciated. No facial droop Skin:  Skin is warm, dry and intact. No rash noted. Psychiatric: Mood and affect are normal. Speech and behavior are normal.  ____________________________________________   LABS (all labs ordered are listed, but only abnormal results are displayed)  Results for orders placed or performed during the hospital encounter of 10/19/18 (from the past 24 hour(s))  CBC with Differential/Platelet     Status: Abnormal   Collection Time: 10/19/18  6:08 PM  Result Value Ref Range   WBC 3.7 (L) 4.0 - 10.5 K/uL   RBC 4.73 3.87 - 5.11 MIL/uL   Hemoglobin 14.4 12.0 - 15.0 g/dL   HCT 44.1 36.0 - 46.0 %   MCV 93.2 80.0 - 100.0 fL   MCH 30.4 26.0 - 34.0 pg   MCHC 32.7 30.0 - 36.0 g/dL   RDW 14.6 11.5 - 15.5 %    Platelets 166 150 - 400 K/uL   nRBC 0.0 0.0 - 0.2 %   Neutrophils Relative % 79 %   Neutro Abs 2.9 1.7 - 7.7 K/uL   Lymphocytes Relative 11 %   Lymphs Abs 0.4 (L) 0.7 - 4.0 K/uL   Monocytes Relative 9 %   Monocytes Absolute 0.3 0.1 - 1.0 K/uL   Eosinophils Relative 1 %   Eosinophils Absolute 0.1 0.0 - 0.5 K/uL   Basophils Relative 0 %   Basophils Absolute 0.0 0.0 - 0.1 K/uL   Immature Granulocytes 0 %   Abs Immature Granulocytes 0.01 0.00 - 0.07 K/uL  Comprehensive metabolic panel     Status: Abnormal   Collection Time: 10/19/18  6:08 PM  Result Value Ref Range   Sodium 139 135 - 145 mmol/L   Potassium 3.9 3.5 - 5.1 mmol/L   Chloride 100 98 - 111 mmol/L   CO2 27 22 - 32 mmol/L   Glucose, Bld 124 (H) 70 - 99 mg/dL   BUN 20 8 - 23 mg/dL   Creatinine, Ser 1.19 (H) 0.44 - 1.00 mg/dL   Calcium 8.8 (L) 8.9 - 10.3 mg/dL   Total Protein 7.4 6.5 - 8.1 g/dL   Albumin 4.1 3.5 - 5.0 g/dL   AST 115 (H) 15 - 41 U/L   ALT 47 (H) 0 - 44 U/L   Alkaline Phosphatase 100 38 - 126 U/L   Total Bilirubin 1.7 (H) 0.3 - 1.2 mg/dL   GFR calc non Af Amer 42 (L) >60 mL/min   GFR calc Af Amer 49 (L) >60 mL/min   Anion gap 12 5 - 15  Glucose, capillary     Status: Abnormal   Collection Time: 10/19/18  6:15 PM  Result Value Ref Range   Glucose-Capillary 116 (H) 70 - 99 mg/dL  Urinalysis, Complete w Microscopic     Status: Abnormal   Collection Time: 10/19/18  7:58 PM  Result Value Ref Range   Color, Urine AMBER (A) YELLOW   APPearance HAZY (A) CLEAR   Specific Gravity, Urine 1.025 1.005 - 1.030   pH 5.0 5.0 - 8.0   Glucose, UA NEGATIVE NEGATIVE mg/dL   Hgb urine dipstick NEGATIVE NEGATIVE   Bilirubin Urine NEGATIVE NEGATIVE   Ketones, ur 20 (A) NEGATIVE mg/dL   Protein, ur 30 (A) NEGATIVE mg/dL   Nitrite NEGATIVE NEGATIVE   Leukocytes,Ua NEGATIVE NEGATIVE   RBC / HPF 0-5 0 - 5 RBC/hpf   WBC, UA 0-5 0 - 5 WBC/hpf   Bacteria, UA NONE SEEN NONE SEEN  Squamous Epithelial / LPF 0-5 0 - 5    Mucus PRESENT   SARS Coronavirus 2 [facility] order, Performed in [facility] hospital lab) Nasopharyngeal Nasopharyngeal Swab     Status: None   Collection Time: 10/19/18  7:58 PM   Specimen: Nasopharyngeal Swab  Result Value Ref Range   SARS Coronavirus 2 NEGATIVE NEGATIVE   ____________________________________________  EKG My review and personal interpretation at Time: 19:25   Indication: ams  Rate: 85  Rhythm: variably conducted aflutter Axis: normal Other: nonspecific st abn, t wave inversions consistent with previous ekg ____________________________________________  RADIOLOGY  I personally reviewed all radiographic images ordered to evaluate for the above acute complaints and reviewed radiology reports and findings.  These findings were personally discussed with the patient.  Please see medical record for radiology report.  ____________________________________________   PROCEDURES  Procedure(s) performed:  Procedures    Critical Care performed: no ____________________________________________   INITIAL IMPRESSION / ASSESSMENT AND PLAN / ED COURSE  Pertinent labs & imaging results that were available during my care of the patient were reviewed by me and considered in my medical decision making (see chart for details).   DDX: Dehydration, sepsis, pna, uti, hypoglycemia, cva, drug effect, withdrawal, encephalitis   Nicole Walton is a 85 y.o. who presents to the ED with symptoms as described above.  Patient is frail and ill-appearing.  No focal neuro deficits.  Patient moving all extremities.  Does have remote history of stroke.  Sounds like last time seen normal was earlier this morning if not last night.  Outside of the window for code stroke.  We have a higher suspicion for some metabolic process, UTI or infection based on her symptoms.  We will give some IV fluids as the patient's family reports that she is not eating or drinking and may be dehydration is playing a  role.  Doubt medication effect as she is not had any recent changes.  Even after fluids with reassuring work-up patient does have evidence of mild AKI.  No evidence of UTI.  Will discuss with hospitalist for observation.  Given her remote history of stroke will order MRI.  Family denies any diagnosis of dementia.  Have discussed with the patient and available family all diagnostics and treatments performed thus far and all questions were answered to the best of my ability. The patient demonstrates understanding and agreement with plan.      The patient was evaluated in Emergency Department today for the symptoms described in the history of present illness. He/she was evaluated in the context of the global COVID-19 pandemic, which necessitated consideration that the patient might be at risk for infection with the SARS-CoV-2 virus that causes COVID-19. Institutional protocols and algorithms that pertain to the evaluation of patients at risk for COVID-19 are in a state of rapid change based on information released by regulatory bodies including the CDC and federal and state organizations. These policies and algorithms were followed during the patient's care in the ED.  As part of my medical decision making, I reviewed the following data within the [place] notes reviewed and incorporated, Labs reviewed, notes from prior ED visits and [place] Controlled Substance Database   ____________________________________________   FINAL CLINICAL IMPRESSION(S) / ED DIAGNOSES  Final diagnoses:  Altered mental status, unspecified altered mental status type  AKI (acute kidney injury) ([place])      NEW MEDICATIONS STARTED DURING THIS VISIT:  New Prescriptions   No medications on file  Note:  This document was prepared using Dragon voice recognition software and may include unintentional dictation errors.    Merlyn Lot, MD 10/19/18 2141

## 2018-10-19 NOTE — ED Triage Notes (Signed)
Pt in via EMS from home with husband who has dementia. Family called EMS as they felt like pt was showing s/s of stroke. Per EMS, family stated pt normally confused to some extent. History of Afib. Zofran 4mg  given by EMS as pt was nauseous. UTI 2 months ago per EMS.

## 2018-10-19 NOTE — ED Notes (Signed)
Pt transported to imaging. Will collect EKG/covid/etc once pt back.

## 2018-10-19 NOTE — ED Notes (Signed)
Pt not yet back from MRI. Will hang fluids once back.

## 2018-10-19 NOTE — ED Notes (Signed)
Pt to be switched to ED13 once clean. Glass blower/designer verbal okay.

## 2018-10-19 NOTE — ED Notes (Signed)
[place] of blood tubes sent to lab.

## 2018-10-19 NOTE — ED Notes (Signed)
This RN's ascom given to pt's daughter-in-law to complete MRI screening.

## 2018-10-19 NOTE — ED Notes (Signed)
Assisted pt to the restroom. Pt states she is feeling nauseated Metta Clines, Therapist, sports notified. No other needs voiced at this time.

## 2018-10-19 NOTE — ED Notes (Signed)
Per family at bedside pt normally A&Ox4 but can be "forgetful" at times.

## 2018-10-19 NOTE — ED Notes (Signed)
BG 116

## 2018-10-19 NOTE — ED Notes (Signed)
Mayra, NT assisted this RN with I&O cath. Urine sample sent to lab.

## 2018-10-19 NOTE — ED Notes (Signed)
Pt given food tray and drink verbal okay from EDP.  

## 2018-10-20 ENCOUNTER — Inpatient Hospital Stay: Payer: Medicare HMO

## 2018-10-20 ENCOUNTER — Other Ambulatory Visit: Payer: Self-pay

## 2018-10-20 LAB — PROTIME-INR
INR: 1.8 — ABNORMAL HIGH (ref 0.8–1.2)
Prothrombin Time: 20.8 seconds — ABNORMAL HIGH (ref 11.4–15.2)

## 2018-10-20 LAB — LIPASE, BLOOD: Lipase: 17 U/L (ref 11–51)

## 2018-10-20 LAB — VITAMIN B12: Vitamin B-12: 2656 pg/mL — ABNORMAL HIGH (ref 180–914)

## 2018-10-20 LAB — TSH: TSH: 0.942 u[IU]/mL (ref 0.350–4.500)

## 2018-10-20 MED ORDER — INFLUENZA VAC A&B SA ADJ QUAD 0.5 ML IM PRSY
0.5000 mL | PREFILLED_SYRINGE | INTRAMUSCULAR | Status: AC
  Administered 2018-10-21: 0.5 mL via INTRAMUSCULAR
  Filled 2018-10-20: qty 0.5

## 2018-10-20 MED ORDER — ENSURE ENLIVE PO LIQD
237.0000 mL | Freq: Two times a day (BID) | ORAL | Status: DC
  Administered 2018-10-21: 237 mL via ORAL

## 2018-10-20 MED ORDER — WARFARIN SODIUM 4 MG PO TABS
4.0000 mg | ORAL_TABLET | ORAL | Status: AC
  Administered 2018-10-20: 03:00:00 4 mg via ORAL
  Filled 2018-10-20: qty 1

## 2018-10-20 MED ORDER — WARFARIN - PHARMACIST DOSING INPATIENT
Freq: Every day | Status: DC
  Filled 2018-10-20: qty 1

## 2018-10-20 MED ORDER — ADULT MULTIVITAMIN W/MINERALS CH
1.0000 | ORAL_TABLET | Freq: Every day | ORAL | Status: DC
  Administered 2018-10-21: 1 via ORAL
  Filled 2018-10-20: qty 1

## 2018-10-20 MED ORDER — WARFARIN SODIUM 4 MG PO TABS
4.0000 mg | ORAL_TABLET | Freq: Once | ORAL | Status: AC
  Administered 2018-10-20: 4 mg via ORAL
  Filled 2018-10-20: qty 1

## 2018-10-20 NOTE — ED Notes (Signed)
Pt repositioned in bed.

## 2018-10-20 NOTE — Progress Notes (Signed)
[facility] [place] NAME: Shakeshia Cherry    MR#:  [id number]  DATE OF BIRTH:  [birth year]  SUBJECTIVE:    REVIEW OF SYSTEMS:   ROS Tolerating Diet: Tolerating PT:   DRUG ALLERGIES:   Allergies  Allergen Reactions  . Ciprofloxacin Other (See Comments)    Not sure of reaction   . Contrast Media [Iodinated Diagnostic Agents] Swelling    Of feet and legs.  (Betadine is OK to use)  . Iodine Other (See Comments)  . Nitrofurantoin Other (See Comments)    GI distress  . Penicillin G Other (See Comments)    Has patient had a PCN reaction causing immediate rash, facial/tongue/throat swelling, SOB or lightheadedness with hypotension: No Has patient had a PCN reaction causing severe rash involving mucus membranes or skin necrosis: No Has patient had a PCN reaction that required hospitalization: No Has patient had a PCN reaction occurring within the last 10 years: No If all of the above answers are "NO", then may proceed with Cephalosporin use.    VITALS:  Blood pressure 135/77, pulse 72, temperature 98.2 F (36.8 C), temperature source Oral, resp. rate 16, height 5\' 3"  (1.6 m), weight 78.8 kg, SpO2 99 %.  PHYSICAL EXAMINATION:   Physical Exam  GENERAL:  85 y.o.-year-old patient lying in the bed with no acute distress.  EYES: Pupils equal, round, reactive to light and accommodation. No scleral icterus. Extraocular muscles intact.  HEENT: Head atraumatic, normocephalic. Oropharynx and nasopharynx clear.  NECK:  Supple, no jugular venous distention. No thyroid enlargement, no tenderness.  LUNGS: Normal breath sounds bilaterally, no wheezing, rales, rhonchi. No use of accessory muscles of respiration.  CARDIOVASCULAR: S1, S2 normal. No murmurs, rubs, or gallops.  ABDOMEN: Soft, nontender, nondistended. Bowel sounds present. No organomegaly or mass.  EXTREMITIES: No cyanosis, clubbing or edema b/l.    NEUROLOGIC: Cranial nerves II  through XII are intact. No focal Motor or sensory deficits b/l.   PSYCHIATRIC:  patient is alert and oriented x 3.  SKIN: No obvious rash, lesion, or ulcer.   LABORATORY PANEL:  CBC Recent Labs  Lab 10/19/18 1808  WBC 3.7*  HGB 14.4  HCT 44.1  PLT 166    Chemistries  Recent Labs  Lab 10/19/18 1808  NA 139  K 3.9  CL 100  CO2 27  GLUCOSE 124*  BUN 20  CREATININE 1.19*  CALCIUM 8.8*  AST 115*  ALT 47*  ALKPHOS 100  BILITOT 1.7*   Cardiac Enzymes No results for input(s): TROPONINI in the last 168 hours. RADIOLOGY:  Ct Head Wo Contrast  Result Date: 10/19/2018 CLINICAL DATA:  Stroke-like symptoms EXAM: CT HEAD WITHOUT CONTRAST TECHNIQUE: Contiguous axial images were obtained from the base of the skull through the vertex without intravenous contrast. COMPARISON:  12/06/2016 FINDINGS: Brain: Mild atrophic changes and chronic white matter ischemic changes are noted. No findings to suggest acute hemorrhage, acute infarction or space-occupying mass lesion are seen. Old left cerebellar infarct is noted. Vascular: No hyperdense vessel or unexpected calcification. Skull: Normal. Negative for fracture or focal lesion. Sinuses/Orbits: No acute finding. Other: None. IMPRESSION: Chronic atrophic and ischemic changes without acute abnormality. Electronically Signed   By: Inez Catalina M.D.   On: 10/19/2018 19:10   Mr Brain Wo Contrast  Result Date: 10/19/2018 CLINICAL DATA:  85 year old female with altered mental status, stroke-like symptoms. EXAM: MRI HEAD WITHOUT CONTRAST TECHNIQUE: Multiplanar, multiecho pulse sequences of the brain and surrounding structures  were obtained without intravenous contrast. COMPARISON:  Head CT earlier today.  Brain MRI 02/25/2016. FINDINGS: Brain: No restricted diffusion to suggest acute infarction. No midline shift, mass effect, evidence of mass lesion, ventriculomegaly, extra-axial collection or acute intracranial hemorrhage. Cervicomedullary junction and  pituitary are within normal limits. Stable gray and white matter signal throughout the brain. 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 Small area of chronic cortical encephalomalacia at the posterior operculum. Chronic microhemorrhage in the left anterior centrum semiovale. Negative for age gray and white matter signal elsewhere. Vascular: Major intracranial vascular flow voids are stable since 2018. Skull and upper cervical spine: Partially visible advanced widespread cervical spine degeneration. Multilevel degenerative spondylolisthesis (series 13, image 11. Visualized bone marrow signal is within normal limits. Sinuses/Orbits: Stable and negative. Other: Mastoids remain clear. Visible internal auditory structures appear normal. Scalp and face soft tissues appear negative. IMPRESSION: No acute intracranial abnormality. Stable non-contrast MRI appearance of the brain since 2018, small chronic infarcts in the left cerebellum and right operculum. Electronically Signed   By: Genevie Ann M.D.   On: 10/19/2018 22:43   Dg Chest Portable 1 View  Result Date: 10/19/2018 CLINICAL DATA:  Altered mental status EXAM: PORTABLE CHEST 1 VIEW COMPARISON:  08/16/2010 FINDINGS: No consolidation or effusion. Mild cardiomegaly with aortic atherosclerosis. No pneumothorax. IMPRESSION: No active disease.  Mild cardiomegaly Electronically Signed   By: Donavan Foil M.D.   On: 10/19/2018 19:14   US Abdomen Limited Ruq  Result Date: 10/20/2018 CLINICAL DATA:  Abdominal pain.  Cholecystectomy. EXAM: ULTRASOUND ABDOMEN LIMITED RIGHT UPPER QUADRANT COMPARISON:  No recent prior. FINDINGS: Gallbladder: Cholecystectomy. Common bile duct: Diameter: 6.7 mm Liver: No focal lesion identified. Within normal limits in parenchymal echogenicity. Portal vein is patent on color Doppler imaging with normal direction of blood flow towards the liver. Other: None. IMPRESSION: 1.  Prior cholecystectomy.  No biliary distention. 2.  No acute  abnormality identified. Electronically Signed   By: Marcello Moores  Register   On: 10/20/2018 05:17   ASSESSMENT AND PLAN:   85 year old female with past medical history of CVA, atrial fibrillation on Coumadin, sleep apnea, CA rectum, hypothyroidism, and urinary incontinence presenting to the ED with altered mental status  1. Altered mental status -initial concerns for ischemic event given history of CVA and atrial fibrillation with underlying vascular dementia-- this episode could very well be TIA given the presentation of mild facial drool on the right noted by daughter in law - CT head and MRI brain reviewed and showed no acute intracranial abnormality - UA negative for UTI - Chest x-ray shows no active cardiopulmonary disease -  per daughter in law patient is on warfarin for multiple strokes and TIA in the past. Patient has been on warfarin. INR is 1.8.  2. Abdominal pain - Also with elevated LFTs and bilirubin -  ultrasound abdomen no acute abnormality - PRN antiemetics - Continue to monitor CMP  3. Acute kidney injury-- prerenal azotemia - Hold nephrotoxins -received IV hydration - Continue to monitor renal function  4. Chronic atrial fibrillation-rate controlled on atenolol - Continue Coumadin pharmacy to dose and monitor  5. Hypothyroidism -continue Synthroid  6. DVT prophylaxis -Therapeutically anti-coagulated with warfarin   Will have physical therapy see patient. If continues to improve anticipated discharge to home tomorrow. Daughter in law in agreement.   Case discussed with Care Management/Social Worker. Management plans discussed with the patient, family and they are in agreement.  CODE STATUS: full  DVT Prophylaxis: warfarin  TOTAL TIME  TAKING CARE OF THIS PATIENT: *30* minutes.  >50% time spent on counselling and coordination of care  POSSIBLE D/C IN *1-2* DAYS, DEPENDING ON CLINICAL CONDITION.  Note: This dictation was prepared with Dragon dictation along  with smaller phrase technology. Any transcriptional errors that result from this process are unintentional.  Fritzi Mandes M.D on 10/20/2018 at 3:24 PM  Between 7am to 6pm - Pager - [phone]  After 6pm go to [email or web address] - password [email or web address] [facility]  Office  [phone]  CC: Primary care physician; Tracie Harrier, MDPatient ID: Azucena Freed, female   DOB: [birth year], 85 y.o.   MRN: [id number]

## 2018-10-20 NOTE — Progress Notes (Signed)
Initial Nutrition Assessment  DOCUMENTATION CODES:   Obesity unspecified  INTERVENTION:   Ensure Enlive po BID, each supplement provides 350 kcal and 20 grams of protein  MVI daily   2 gram Na diet   NUTRITION DIAGNOSIS:   Inadequate oral intake related to acute illness as evidenced by per patient/family report.  GOAL:   Patient will meet greater than or equal to 90% of their needs  MONITOR:   PO intake, Supplement acceptance, Labs, Weight trends, I & O's, Skin  REASON FOR ASSESSMENT:   Malnutrition Screening Tool    ASSESSMENT:   85 year old female with past medical history of CVA, atrial fibrillation on Coumadin, sleep apnea, CA rectum, hypothyroidism, and urinary incontinence presenting to the ED with altered mental status.  RD working remotely.  Unable to speak with pt r/t AMS. RD will add supplements and MVI to help pt meet her estimated needs. Per chart, pt with 30lb(15%) weight loss over the past 6 months. RD suspects poor appetite and oral intake at baseline.   Medications reviewed and include: synthroid, B12, warfarin, colace  Labs reviewed: creat 1.19(H), AST 115(H), ALT 47(H), tbili 1.7(H)  wbc- 3.7(L)  Unable to complete Nutrition-Focused physical exam at this time.   Diet Order:   Diet Order            Diet Heart Room service appropriate? Yes; Fluid consistency: Thin  Diet effective now             EDUCATION NEEDS:   No education needs have been identified at this time  Skin:  Skin Assessment: Reviewed RN Assessment(ecchymosis)  Last BM:  pta  Height:   Ht Readings from Last 1 Encounters:  10/20/18 5\' 3"  (1.6 m)    Weight:   Wt Readings from Last 1 Encounters:  10/20/18 78.8 kg    Ideal Body Weight:  52.3 kg  BMI:  Body mass index is 30.77 kg/m.  Estimated Nutritional Needs:   Kcal:  1500-1700kcal/day  Protein:  75-85g/day  Fluid:  >1.5L/day  Koleen Distance MS, RD, LDN Pager #- [phone] Office#-  [phone] After Hours Pager: [phone]

## 2018-10-20 NOTE — Progress Notes (Signed)
ANTICOAGULATION CONSULT NOTE - Initial Consult  Pharmacy Consult for Coumadin (pt takes at home) Indication: atrial fibrillation  Allergies  Allergen Reactions  . Ciprofloxacin Other (See Comments)    Not sure of reaction   . Contrast Media [Iodinated Diagnostic Agents] Swelling    Of feet and legs.  (Betadine is OK to use)  . Iodine Other (See Comments)  . Nitrofurantoin Other (See Comments)    GI distress  . Penicillin G Other (See Comments)    Has patient had a PCN reaction causing immediate rash, facial/tongue/throat swelling, SOB or lightheadedness with hypotension: No Has patient had a PCN reaction causing severe rash involving mucus membranes or skin necrosis: No Has patient had a PCN reaction that required hospitalization: No Has patient had a PCN reaction occurring within the last 10 years: No If all of the above answers are "NO", then may proceed with Cephalosporin use.    Patient Measurements: Height: 5\' 4"  (162.6 cm) Weight: 150 lb (68 kg) IBW/kg (Calculated) : 54.7  Vital Signs: Temp: 98.3 F (36.8 C) (09/14 1809) Temp Source: Oral (09/14 1809) BP: 151/89 (09/15 0130) Pulse Rate: 92 (09/15 0130)  Labs: Recent Labs    10/19/18 1808  HGB 14.4  HCT 44.1  PLT 166  LABPROT 20.8*  INR 1.8*  CREATININE 1.19*    Estimated Creatinine Clearance: 33.3 mL/min (A) (by C-G formula based on SCr of 1.19 mg/dL (H)).   Medical History: Past Medical History:  Diagnosis Date  . Atrial fibrillation ([place])   . Dysrhythmia    A-fib  . Edema    feet/legs  . Hypothyroidism   . Incontinence   . Shortness of breath dyspnea    with exersion  . Sleep apnea   . Stroke ([place])   . TIA (transient ischemic attack)   . TIA (transient ischemic attack)     Medications:  (Not in a hospital admission)   Assessment: Asked to resume Warfarin from home.  Pt reportedly takes 4mg  Mon thru Fri and 5mg  on Sat and Sun.  INR is below goal.   Goal of Therapy:  INR 2-3 Monitor  platelets by anticoagulation protocol: Yes   Plan:  Coumadin 4mg  x 1 Monitor INR and adjust per protocol  Hart Robinsons A 10/20/2018,2:10 AM

## 2018-10-20 NOTE — H&P (Signed)
[facility] [place] NAME: Nicole Walton    MR#:  [id number]  DATE OF BIRTH:  [birth year]  DATE OF ADMISSION:  10/19/2018  PRIMARY CARE PHYSICIAN: Tracie Harrier, MD   REQUESTING/REFERRING PHYSICIAN: Merlyn Lot, MD  CHIEF COMPLAINT:   Chief Complaint  Patient presents with  . Altered Mental Status    HISTORY OF PRESENT ILLNESS:   85 year old female with past medical history of CVA, atrial fibrillation on Coumadin, sleep apnea, CA rectum, hypothyroidism, and urinary incontinence presenting to the ED with altered mental status.  Patient's daughter-in-law who is currently at the bedside, patient's husband called with him at around 5 pm to report that patient was not acting at baseline. When they arrived they found her confused with mild left facial drooping without other focal neurologic deficit.  Prior to that episode, patient has had intermittent episodes of confusion and short-term memory loss worse since her last stroke.  Per daughter-in-law, patient's gait is unsteady causing her to fall several weeks ago. Patient's family got concerned that she may have had another stroke due to a history of atrial fibrillation and several TIAs and CVA.  They therefore decided to call EMS who brought her to the ED for further evaluation.  On arrival to the ED, she was afebrile with blood pressure 157/94 mm Hg and pulse rate 80 beats/min. There were no focal neurological deficits; she was alert and oriented x3.  Labs revealed glucose 124, creatinine 1.19, AST 115, ALT 47, total bilirubin 1.7, PT 20.8, INR 1.8.  Urinalysis negative for UTI.  Contrast CT head was obtained and showed no acute intracranial abnormality.  CVA and atrial fibrillation a follow-up MRI of the brain was also obtained which showed no acute intracranial abnormality.  Patient will be admitted under hospitalist service for further management.  PAST MEDICAL HISTORY:   Past  Medical History:  Diagnosis Date  . Atrial fibrillation ([place])   . Dysrhythmia    A-fib  . Edema    feet/legs  . Hypothyroidism   . Incontinence   . Shortness of breath dyspnea    with exersion  . Sleep apnea   . Stroke ([place])   . TIA (transient ischemic attack)   . TIA (transient ischemic attack)     PAST SURGICAL HISTORY:   Past Surgical History:  Procedure Laterality Date  . CATARACT EXTRACTION W/PHACO Right 04/20/2015   Procedure: CATARACT EXTRACTION PHACO AND INTRAOCULAR LENS PLACEMENT (IOC);  Surgeon: Leandrew Koyanagi, MD;  Location: [facility] ORS;  Service: Ophthalmology;  Laterality: Right;  [place]   1:12.1 AP%  16.2 CDE   11.69 fluid cassette lot# [id number]   exp.07/04/2016  . CATARACT EXTRACTION W/PHACO Left 06/01/2015   Procedure: CATARACT EXTRACTION PHACO AND INTRAOCULAR LENS PLACEMENT (IOC);  Surgeon: Leandrew Koyanagi, MD;  Location: [facility] ORS;  Service: Ophthalmology;  Laterality: Left;  [place] 1.17 AP% 9.9 CDE 765 Fluid Pack Lot # [id number]  . CESAREAN SECTION    . CHOLECYSTECTOMY    . COLONOSCOPY    . COLONOSCOPY WITH PROPOFOL N/A 07/14/2017   Procedure: COLONOSCOPY WITH PROPOFOL;  Surgeon: Lin Landsman, MD;  Location: [facility] ENDOSCOPY;  Service: Gastroenterology;  Laterality: N/A;  . COLONOSCOPY WITH PROPOFOL N/A 07/18/2017   Procedure: COLONOSCOPY WITH PROPOFOL;  Surgeon: Lin Landsman, MD;  Location: [facility] ENDOSCOPY;  Service: Gastroenterology;  Laterality: N/A;  . EUS N/A 09/11/2017   Procedure: LOWER ENDOSCOPIC ULTRASOUND (EUS);  Surgeon: Burbridge, Murray Hodgkins, MD;  Location: [facility] ENDOSCOPY;  Service: Gastroenterology;  Laterality: N/A;  . FLEXIBLE SIGMOIDOSCOPY N/A 03/19/2018   Procedure: FLEXIBLE SIGMOIDOSCOPY;  Surgeon: Lin Landsman, MD;  Location: [facility] ENDOSCOPY;  Service: Gastroenterology;  Laterality: N/A;  . FOOT SURGERY      SOCIAL HISTORY:   Social History   Tobacco Use  . Smoking status: Never Smoker  . Smokeless tobacco: Never Used   Substance Use Topics  . Alcohol use: No    FAMILY HISTORY:   Family History  Problem Relation Age of Onset  . Atrial fibrillation Son   . Diabetes Daughter   . Breast cancer Neg Hx     DRUG ALLERGIES:   Allergies  Allergen Reactions  . Ciprofloxacin Other (See Comments)    Not sure of reaction   . Contrast Media [Iodinated Diagnostic Agents] Swelling    Of feet and legs.  (Betadine is OK to use)  . Iodine Other (See Comments)  . Nitrofurantoin Other (See Comments)    GI distress  . Penicillin G Other (See Comments)    Has patient had a PCN reaction causing immediate rash, facial/tongue/throat swelling, SOB or lightheadedness with hypotension: No Has patient had a PCN reaction causing severe rash involving mucus membranes or skin necrosis: No Has patient had a PCN reaction that required hospitalization: No Has patient had a PCN reaction occurring within the last 10 years: No If all of the above answers are "NO", then may proceed with Cephalosporin use.    REVIEW OF SYSTEMS:   Review of Systems  Constitutional: Negative for chills, fever, malaise/fatigue and weight loss.  HENT: Negative for congestion, hearing loss and sore throat.   Eyes: Negative for blurred vision and double vision.  Respiratory: Negative for cough, shortness of breath and wheezing.   Cardiovascular: Negative for chest pain, palpitations, orthopnea and leg swelling.  Gastrointestinal: Positive for abdominal pain, nausea and vomiting. Negative for diarrhea.  Genitourinary: Negative for dysuria and urgency.  Musculoskeletal: Positive for falls. Negative for myalgias.  Skin: Negative for rash.  Neurological: Negative for dizziness, sensory change, speech change, focal weakness and headaches.  Psychiatric/Behavioral: Positive for memory loss. Negative for depression.    MEDICATIONS AT HOME:   Prior to Admission medications   Medication Sig Start Date End Date Taking? Authorizing Provider  atenolol  (TENORMIN) 25 MG tablet Take 1 tablet by mouth daily. 11/04/16  Yes [provider]  Calcium-Magnesium-Vitamin D (CALCIUM 500 PO) Take 1 tablet by mouth daily.   Yes [provider]  Cetirizine HCl (ZYRTEC ALLERGY) 10 MG CAPS Take 1 tablet by mouth daily as needed.    Yes [provider]  clopidogrel (PLAVIX) 75 MG tablet Take 75 mg by mouth daily.  01/09/17  Yes [provider]  docusate sodium (COLACE) 100 MG capsule Take 100 mg by mouth 2 (two) times daily as needed for mild constipation.   Yes [provider]  furosemide (LASIX) 40 MG tablet Take 40-80 mg by mouth daily as needed.  11/17/16  Yes [provider]  HYDROcodone-acetaminophen (NORCO/VICODIN) 5-325 MG tablet Take 1 tablet by mouth every 6 (six) hours as needed. For back pain 03/08/16  Yes [provider]  hydrOXYzine (ATARAX/VISTARIL) 25 MG tablet Take 1 tablet by mouth 3 (three) times daily as needed. 07/31/18  Yes [provider]  levothyroxine (SYNTHROID) 75 MCG tablet Take 75 mcg by mouth daily before breakfast.    Yes [provider]  oxybutynin (DITROPAN) 5 MG tablet Take 5 mg by mouth 2 (two) times  daily.    Yes [provider]  vitamin B-12 (CYANOCOBALAMIN) 500 MCG tablet Take 1 tablet by mouth 2 (two) times daily.   Yes [provider]  warfarin (COUMADIN) 4 MG tablet TAKE 1 TABLET (4MG ) DAILY MONDAY THROUGH FRIDAY. (TAKE 5MG  ON SATURDAY AND SUNDAY AS DIRECTED) 04/22/17  Yes [provider]  warfarin (COUMADIN) 5 MG tablet Take 5 mg by mouth as directed. Every Saturday and Sunday evening 05/06/17  Yes [provider]  furosemide (LASIX) 40 MG tablet  06/13/17   [provider]  ketoconazole (NIZORAL) 2 % cream Apply 1 application topically daily. 11/07/16   [provider]  KRILL OIL PO Take 1 tablet by mouth as needed.    [provider]  levothyroxine (SYNTHROID, LEVOTHROID) 75 MCG tablet Take  by mouth. 04/02/17 04/02/18  [provider]  Menthol-Zinc Oxide (CALMOSEPTINE) 0.44-20.6 % OINT Apply 1 application topically daily. 11/07/16   [provider]  methylPREDNISolone (MEDROL DOSEPAK) 4 MG TBPK tablet Take Tapered dose as directed Patient not taking: Reported on 10/19/2018 08/20/18   Sable Feil, PA-C  nitroGLYCERIN (NITROSTAT) 0.4 MG SL tablet Place 0.4 mg under the tongue every 5 (five) minutes as needed for chest pain.    [provider]  triamcinolone cream (KENALOG) 0.1 % Apply 1 application topically 2 (two) times daily.    [provider]      VITAL SIGNS:  Blood pressure 110/63, pulse 85, temperature 98.3 F (36.8 C), temperature source Oral, resp. rate (!) 21, height 5\' 4"  (1.626 m), weight 68 kg, SpO2 96 %.  PHYSICAL EXAMINATION:   Physical Exam  GENERAL:  85 y.o.-year-old patient lying in the bed with no acute distress.  EYES: Pupils equal, round, reactive to light and accommodation. No scleral icterus. Extraocular muscles intact.  HEENT: Head atraumatic, normocephalic. Oropharynx and nasopharynx clear.  NECK:  Supple, no jugular venous distention. No thyroid enlargement, no tenderness.  LUNGS: Normal breath sounds bilaterally, no wheezing, rales,rhonchi or crepitation. No use of accessory muscles of respiration.  CARDIOVASCULAR: S1, S2 normal. No murmurs, rubs, or gallops.  ABDOMEN: Soft, mild abdominal tenderness, nondistended. Bowel sounds present. No organomegaly or mass.  EXTREMITIES: No pedal edema, cyanosis, or clubbing. No rash or lesions. + pedal pulses MUSCULOSKELETAL: Normal bulk, and power was 5+ grip and elbow, knee, and ankle flexion and extension bilaterally.  NEUROLOGIC: Alert and oriented x 3. CN 2-12 intact. Sensation to light touch and cold stimuli intact bilaterally. Finger to nose nl. Babinski is downgoing. DTR's (biceps, patellar, and achilles) 2+ and symmetric throughout. Gait not tested due to safety  concern. PSYCHIATRIC: The patient is alert and oriented x 2.  SKIN: No obvious rash, lesion, or ulcer.   DATA REVIEWED:  LABORATORY PANEL:   CBC Recent Labs  Lab 10/19/18 1808  WBC 3.7*  HGB 14.4  HCT 44.1  PLT 166   ------------------------------------------------------------------------------------------------------------------  Chemistries  Recent Labs  Lab 10/19/18 1808  NA 139  K 3.9  CL 100  CO2 27  GLUCOSE 124*  BUN 20  CREATININE 1.19*  CALCIUM 8.8*  AST 115*  ALT 47*  ALKPHOS 100  BILITOT 1.7*   ------------------------------------------------------------------------------------------------------------------  Cardiac Enzymes No results for input(s): TROPONINI in the last 168 hours. ------------------------------------------------------------------------------------------------------------------  RADIOLOGY:  Ct Head Wo Contrast  Result Date: 10/19/2018 CLINICAL DATA:  Stroke-like symptoms EXAM: CT HEAD WITHOUT CONTRAST TECHNIQUE: Contiguous axial images were obtained from the base of the skull through the vertex without intravenous contrast. COMPARISON:  12/06/2016 FINDINGS: Brain: Mild atrophic changes and chronic white matter ischemic changes are noted. No findings to suggest acute hemorrhage, acute infarction or space-occupying mass lesion are seen. Old left cerebellar infarct is noted. Vascular: No hyperdense vessel or unexpected calcification. Skull: Normal. Negative for fracture or focal lesion. Sinuses/Orbits: No acute finding. Other: None. IMPRESSION: Chronic atrophic and ischemic changes without acute abnormality. Electronically Signed   By: Inez Catalina M.D.   On: 10/19/2018 19:10   Mr Brain Wo Contrast  Result Date: 10/19/2018 CLINICAL DATA:  85 year old female with altered mental status, stroke-like symptoms. EXAM: MRI HEAD WITHOUT CONTRAST TECHNIQUE: Multiplanar, multiecho pulse sequences of the brain and surrounding structures were obtained  without intravenous contrast. COMPARISON:  Head CT earlier today.  Brain MRI 02/25/2016. FINDINGS: Brain: No restricted diffusion to suggest acute infarction. No midline shift, mass effect, evidence of mass lesion, ventriculomegaly, extra-axial collection or acute intracranial hemorrhage. Cervicomedullary junction and pituitary are within normal limits. Stable gray and white matter signal throughout the brain. 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 Small area of chronic cortical encephalomalacia at the posterior operculum. Chronic microhemorrhage in the left anterior centrum semiovale. Negative for age gray and white matter signal elsewhere. Vascular: Major intracranial vascular flow voids are stable since 2018. Skull and upper cervical spine: Partially visible advanced widespread cervical spine degeneration. Multilevel degenerative spondylolisthesis (series 13, image 11. Visualized bone marrow signal is within normal limits. Sinuses/Orbits: Stable and negative. Other: Mastoids remain clear. Visible internal auditory structures appear normal. Scalp and face soft tissues appear negative. IMPRESSION: No acute intracranial abnormality. Stable non-contrast MRI appearance of the brain since 2018, small chronic infarcts in the left cerebellum and right operculum. Electronically Signed   By: Genevie Ann M.D.   On: 10/19/2018 22:43   Dg Chest Portable 1 View  Result Date: 10/19/2018 CLINICAL DATA:  Altered mental status EXAM: PORTABLE CHEST 1 VIEW COMPARISON:  08/16/2010 FINDINGS: No consolidation or effusion. Mild cardiomegaly with aortic atherosclerosis. No pneumothorax. IMPRESSION: No active disease.  Mild cardiomegaly Electronically Signed   By: Donavan Foil M.D.   On: 10/19/2018 19:14    EKG:  EKG: unchanged from previous tracings, atrial fibrillation, rate 84 Vent. rate 84 BPM PR interval * ms QRS duration 64 ms QT/QTc 396/467 ms P-R-T axes * 49 -75 IMPRESSION AND PLAN:   85 y.o. female with past  medical history of CVA, atrial fibrillation on Coumadin, sleep apnea, CA rectum, hypothyroidism, and urinary incontinence presenting to the ED with altered mental status.  1. Altered mental status -initial concerns for ischemic event given history of CVA and atrial fibrillation with underlying vascular dementia. - Admit to telemetry unit - CT head and MRI brain reviewed and showed no acute intracranial abnormality - UA negative for UTI - Chest x-ray shows no active cardiopulmonary disease - Check TSH and B12  2. Abdominal pain - Also with elevated LFTs and bilirubin - Check lipase - Check ultrasound RUQ, if negative and symptoms persist may need CT abdomen and pelvis - IVFs - PRN antiemetics - Continue to monitor CMP  3. Acute kidney injury - Hold nephrotoxins - IVFs hydration - Continue to monitor renal function  4. Chronic atrial fibrillation-rate controlled on atenolol - Continue Coumadin pharmacy to dose and monitor  5. Hypothyroidism -continue Synthroid  6. DVT prophylaxis -Therapeutically anti-coagulated with warfarin    All the records are reviewed and case discussed with ED provider. Management plans discussed with the patient, family and they are in agreement.  CODE  STATUS: FULL  TOTAL TIME TAKING CARE OF THIS PATIENT: 50 minutes.    on 10/20/2018 at 2:57 AM   Rufina Falco, DNP, FNP-BC [facility] Nurse Practitioner Between 7am to 6pm - Pager [phone]  After 6pm go to [email or web address] - password [facility]  Office  [phone]  CC: Primary care physician; Tracie Harrier, MD

## 2018-10-20 NOTE — ED Notes (Addendum)
Lights dimmed for pt. Pt request meds for lower abd pain and back pain. Will notify attending.

## 2018-10-20 NOTE — ED Notes (Signed)
ED TO INPATIENT HANDOFF REPORT  ED Nurse Name and Phone #: Tabius Rood 11  S Name/Age/Gender Topaz M Mcdade 85 y.o. female Room/Bed: ED13A/ED13A  Code Status   Code Status: Full Code  Home/SNF/Other Home Patient oriented to: self, place, time and situation Is this baseline? Yes   Triage Complete: Triage complete  Chief Complaint ams  Triage Note Pt in via EMS from home with husband who has dementia. Family called EMS as they felt like pt was showing s/s of stroke. Per EMS, family stated pt normally confused to some extent. History of Afib. Zofran 4mg  given by EMS as pt was nauseous. UTI 2 months ago per EMS.    Allergies Allergies  Allergen Reactions  . Ciprofloxacin Other (See Comments)    Not sure of reaction   . Contrast Media [Iodinated Diagnostic Agents] Swelling    Of feet and legs.  (Betadine is OK to use)  . Iodine Other (See Comments)  . Nitrofurantoin Other (See Comments)    GI distress  . Penicillin G Other (See Comments)    Has patient had a PCN reaction causing immediate rash, facial/tongue/throat swelling, SOB or lightheadedness with hypotension: No Has patient had a PCN reaction causing severe rash involving mucus membranes or skin necrosis: No Has patient had a PCN reaction that required hospitalization: No Has patient had a PCN reaction occurring within the last 10 years: No If all of the above answers are "NO", then may proceed with Cephalosporin use.    Level of Care/Admitting Diagnosis ED Disposition    ED Disposition Condition [facility] Area: [place] [[id number]]  Level of Care: Telemetry [5]  Covid Evaluation: Asymptomatic Screening Protocol (No Symptoms)  Diagnosis: Altered mental status [780.97.ICD-9-CM]  Admitting Physician: Rufina Falco [place]  Attending Physician: Rufina Falco ACHIENG [id number]  Estimated length of stay: past midnight tomorrow  Certification:: I certify this patient  will need inpatient services for at least 2 midnights  PT Class (Do Not Modify): Inpatient [101]  PT Acc Code (Do Not Modify): Private [1]       B Medical/Surgery History Past Medical History:  Diagnosis Date  . Atrial fibrillation ([place])   . Dysrhythmia    A-fib  . Edema    feet/legs  . Hypothyroidism   . Incontinence   . Shortness of breath dyspnea    with exersion  . Sleep apnea   . Stroke ([place])   . TIA (transient ischemic attack)   . TIA (transient ischemic attack)    Past Surgical History:  Procedure Laterality Date  . CATARACT EXTRACTION W/PHACO Right 04/20/2015   Procedure: CATARACT EXTRACTION PHACO AND INTRAOCULAR LENS PLACEMENT (IOC);  Surgeon: Leandrew Koyanagi, MD;  Location: [facility] ORS;  Service: Ophthalmology;  Laterality: Right;  [place]   1:12.1 AP%  16.2 CDE   11.69 fluid cassette lot# [id number]   exp.07/04/2016  . CATARACT EXTRACTION W/PHACO Left 06/01/2015   Procedure: CATARACT EXTRACTION PHACO AND INTRAOCULAR LENS PLACEMENT (IOC);  Surgeon: Leandrew Koyanagi, MD;  Location: [facility] ORS;  Service: Ophthalmology;  Laterality: Left;  [place] 1.17 AP% 9.9 CDE 765 Fluid Pack Lot # [id number]  . CESAREAN SECTION    . CHOLECYSTECTOMY    . COLONOSCOPY    . COLONOSCOPY WITH PROPOFOL N/A 07/14/2017   Procedure: COLONOSCOPY WITH PROPOFOL;  Surgeon: Lin Landsman, MD;  Location: [facility] ENDOSCOPY;  Service: Gastroenterology;  Laterality: N/A;  . COLONOSCOPY WITH PROPOFOL N/A 07/18/2017   Procedure: COLONOSCOPY WITH PROPOFOL;  Surgeon:  Lin Landsman, MD;  Location: [facility] ENDOSCOPY;  Service: Gastroenterology;  Laterality: N/A;  . EUS N/A 09/11/2017   Procedure: LOWER ENDOSCOPIC ULTRASOUND (EUS);  Surgeon: Holly Bodily, MD;  Location: [facility] ENDOSCOPY;  Service: Gastroenterology;  Laterality: N/A;  . FLEXIBLE SIGMOIDOSCOPY N/A 03/19/2018   Procedure: FLEXIBLE SIGMOIDOSCOPY;  Surgeon: Lin Landsman, MD;  Location: [facility] ENDOSCOPY;  Service: Gastroenterology;   Laterality: N/A;  . FOOT SURGERY       A IV Location/Drains/Wounds Patient Lines/Drains/Airways Status   Active Line/Drains/Airways    Name:   Placement date:   Placement time:   Site:   Days:   Peripheral IV 10/19/18 Right Antecubital   10/19/18    1757    Antecubital   1          Intake/Output Last 24 hours No intake or output data in the 24 hours ending 10/20/18 [id number]  Labs/Imaging Results for orders placed or performed during the hospital encounter of 10/19/18 (from the past 48 hour(s))  CBC with Differential/Platelet     Status: Abnormal   Collection Time: 10/19/18  6:08 PM  Result Value Ref Range   WBC 3.7 (L) 4.0 - 10.5 K/uL   RBC 4.73 3.87 - 5.11 MIL/uL   Hemoglobin 14.4 12.0 - 15.0 g/dL   HCT 44.1 36.0 - 46.0 %   MCV 93.2 80.0 - 100.0 fL   MCH 30.4 26.0 - 34.0 pg   MCHC 32.7 30.0 - 36.0 g/dL   RDW 14.6 11.5 - 15.5 %   Platelets 166 150 - 400 K/uL   nRBC 0.0 0.0 - 0.2 %   Neutrophils Relative % 79 %   Neutro Abs 2.9 1.7 - 7.7 K/uL   Lymphocytes Relative 11 %   Lymphs Abs 0.4 (L) 0.7 - 4.0 K/uL   Monocytes Relative 9 %   Monocytes Absolute 0.3 0.1 - 1.0 K/uL   Eosinophils Relative 1 %   Eosinophils Absolute 0.1 0.0 - 0.5 K/uL   Basophils Relative 0 %   Basophils Absolute 0.0 0.0 - 0.1 K/uL   Immature Granulocytes 0 %   Abs Immature Granulocytes 0.01 0.00 - 0.07 K/uL    Comment: Performed at [facility], [address] [place], [place] [address]  Comprehensive metabolic panel     Status: Abnormal   Collection Time: 10/19/18  6:08 PM  Result Value Ref Range   Sodium 139 135 - 145 mmol/L   Potassium 3.9 3.5 - 5.1 mmol/L   Chloride 100 98 - 111 mmol/L   CO2 27 22 - 32 mmol/L   Glucose, Bld 124 (H) 70 - 99 mg/dL   BUN 20 8 - 23 mg/dL   Creatinine, Ser 1.19 (H) 0.44 - 1.00 mg/dL   Calcium 8.8 (L) 8.9 - 10.3 mg/dL   Total Protein 7.4 6.5 - 8.1 g/dL   Albumin 4.1 3.5 - 5.0 g/dL   AST 115 (H) 15 - 41 U/L   ALT 47 (H) 0 - 44 U/L   Alkaline  Phosphatase 100 38 - 126 U/L   Total Bilirubin 1.7 (H) 0.3 - 1.2 mg/dL   GFR calc non Af Amer 42 (L) >60 mL/min   GFR calc Af Amer 49 (L) >60 mL/min   Anion gap 12 5 - 15    Comment: Performed at [facility], [address] [place], [place] [address]  Protime-INR     Status: Abnormal   Collection Time: 10/19/18  6:08 PM  Result Value Ref Range  Prothrombin Time 20.8 (H) 11.4 - 15.2 seconds   INR 1.8 (H) 0.8 - 1.2    Comment: (NOTE) INR goal varies based on device and disease states. Performed at [facility], [address] [place], [place] [address]   Glucose, capillary     Status: Abnormal   Collection Time: 10/19/18  6:15 PM  Result Value Ref Range   Glucose-Capillary 116 (H) 70 - 99 mg/dL  Urinalysis, Complete w Microscopic     Status: Abnormal   Collection Time: 10/19/18  7:58 PM  Result Value Ref Range   Color, Urine AMBER (A) YELLOW    Comment: BIOCHEMICALS MAY BE AFFECTED BY COLOR   APPearance HAZY (A) CLEAR   Specific Gravity, Urine 1.025 1.005 - 1.030   pH 5.0 5.0 - 8.0   Glucose, UA NEGATIVE NEGATIVE mg/dL   Hgb urine dipstick NEGATIVE NEGATIVE   Bilirubin Urine NEGATIVE NEGATIVE   Ketones, ur 20 (A) NEGATIVE mg/dL   Protein, ur 30 (A) NEGATIVE mg/dL   Nitrite NEGATIVE NEGATIVE   Leukocytes,Ua NEGATIVE NEGATIVE   RBC / HPF 0-5 0 - 5 RBC/hpf   WBC, UA 0-5 0 - 5 WBC/hpf   Bacteria, UA NONE SEEN NONE SEEN   Squamous Epithelial / LPF 0-5 0 - 5   Mucus PRESENT     Comment: Performed at [facility], [address] [place], [place] [address]  SARS Coronavirus 2 [facility] order, Performed in [place] hospital lab) Nasopharyngeal Nasopharyngeal Swab     Status: None   Collection Time: 10/19/18  7:58 PM   Specimen: Nasopharyngeal Swab  Result Value Ref Range   SARS Coronavirus 2 NEGATIVE NEGATIVE    Comment: (NOTE) If result is NEGATIVE SARS-CoV-2 target nucleic acids are NOT DETECTED. The SARS-CoV-2 RNA is generally detectable  in upper and lower  respiratory specimens during the acute phase of infection. The lowest  concentration of SARS-CoV-2 viral copies this assay can detect is 250  copies / mL. A negative result does not preclude SARS-CoV-2 infection  and should not be used as the sole basis for treatment or other  patient management decisions.  A negative result may occur with  improper specimen collection / handling, submission of specimen other  than nasopharyngeal swab, presence of viral mutation(s) within the  areas targeted by this assay, and inadequate number of viral copies  (<250 copies / mL). A negative result must be combined with clinical  observations, patient history, and epidemiological information. If result is POSITIVE SARS-CoV-2 target nucleic acids are DETECTED. The SARS-CoV-2 RNA is generally detectable in upper and lower  respiratory specimens dur ing the acute phase of infection.  Positive  results are indicative of active infection with SARS-CoV-2.  Clinical  correlation with patient history and other diagnostic information is  necessary to determine patient infection status.  Positive results do  not rule out bacterial infection or co-infection with other viruses. If result is PRESUMPTIVE POSTIVE SARS-CoV-2 nucleic acids MAY BE PRESENT.   A presumptive positive result was obtained on the submitted specimen  and confirmed on repeat testing.  While 2019 novel coronavirus  (SARS-CoV-2) nucleic acids may be present in the submitted sample  additional confirmatory testing may be necessary for epidemiological  and / or clinical management purposes  to differentiate between  SARS-CoV-2 and other Sarbecovirus currently known to infect humans.  If clinically indicated additional testing with an alternate test  methodology [phone]) is advised. The SARS-CoV-2 RNA is generally  detectable in upper  and lower respiratory sp ecimens during the acute  phase of infection. The expected result is  Negative. Fact Sheet for Patients:  StrictlyIdeas.no Fact Sheet for Healthcare Providers: BankingDealers.co.za This test is not yet approved or cleared by the [place] FDA and has been authorized for detection and/or diagnosis of SARS-CoV-2 by FDA under an Emergency Use Authorization (EUA).  This EUA will remain in effect (meaning this test can be used) for the duration of the COVID-19 declaration under Section 564(b)(1) of the Act, 21 U.S.C. section 360bbb-3(b)(1), unless the authorization is terminated or revoked sooner. Performed at [facility], [place], [place] [address]    Ct Head Wo Contrast  Result Date: 10/19/2018 CLINICAL DATA:  Stroke-like symptoms EXAM: CT HEAD WITHOUT CONTRAST TECHNIQUE: Contiguous axial images were obtained from the base of the skull through the vertex without intravenous contrast. COMPARISON:  12/06/2016 FINDINGS: Brain: Mild atrophic changes and chronic white matter ischemic changes are noted. No findings to suggest acute hemorrhage, acute infarction or space-occupying mass lesion are seen. Old left cerebellar infarct is noted. Vascular: No hyperdense vessel or unexpected calcification. Skull: Normal. Negative for fracture or focal lesion. Sinuses/Orbits: No acute finding. Other: None. IMPRESSION: Chronic atrophic and ischemic changes without acute abnormality. Electronically Signed   By: Inez Catalina M.D.   On: 10/19/2018 19:10   Mr Brain Wo Contrast  Result Date: 10/19/2018 CLINICAL DATA:  85 year old female with altered mental status, stroke-like symptoms. EXAM: MRI HEAD WITHOUT CONTRAST TECHNIQUE: Multiplanar, multiecho pulse sequences of the brain and surrounding structures were obtained without intravenous contrast. COMPARISON:  Head CT earlier today.  Brain MRI 02/25/2016. FINDINGS: Brain: No restricted diffusion to suggest acute infarction. No midline shift, mass effect, evidence  of mass lesion, ventriculomegaly, extra-axial collection or acute intracranial hemorrhage. Cervicomedullary junction and pituitary are within normal limits. Stable gray and white matter signal throughout the brain. 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 Small area of chronic cortical encephalomalacia at the posterior operculum. Chronic microhemorrhage in the left anterior centrum semiovale. Negative for age gray and white matter signal elsewhere. Vascular: Major intracranial vascular flow voids are stable since 2018. Skull and upper cervical spine: Partially visible advanced widespread cervical spine degeneration. Multilevel degenerative spondylolisthesis (series 13, image 11. Visualized bone marrow signal is within normal limits. Sinuses/Orbits: Stable and negative. Other: Mastoids remain clear. Visible internal auditory structures appear normal. Scalp and face soft tissues appear negative. IMPRESSION: No acute intracranial abnormality. Stable non-contrast MRI appearance of the brain since 2018, small chronic infarcts in the left cerebellum and right operculum. Electronically Signed   By: Genevie Ann M.D.   On: 10/19/2018 22:43   Dg Chest Portable 1 View  Result Date: 10/19/2018 CLINICAL DATA:  Altered mental status EXAM: PORTABLE CHEST 1 VIEW COMPARISON:  08/16/2010 FINDINGS: No consolidation or effusion. Mild cardiomegaly with aortic atherosclerosis. No pneumothorax. IMPRESSION: No active disease.  Mild cardiomegaly Electronically Signed   By: Donavan Foil M.D.   On: 10/19/2018 19:14   US Abdomen Limited Ruq  Result Date: 10/20/2018 CLINICAL DATA:  Abdominal pain.  Cholecystectomy. EXAM: ULTRASOUND ABDOMEN LIMITED RIGHT UPPER QUADRANT COMPARISON:  No recent prior. FINDINGS: Gallbladder: Cholecystectomy. Common bile duct: Diameter: 6.7 mm Liver: No focal lesion identified. Within normal limits in parenchymal echogenicity. Portal vein is patent on color Doppler imaging with normal direction of blood  flow towards the liver. Other: None. IMPRESSION: 1.  Prior cholecystectomy.  No biliary distention. 2.  No acute abnormality identified. Electronically Signed  By: [place]   On: 10/20/2018 05:17    Pending Labs Unresulted Labs (From admission, onward)    Start     Ordered   10/21/18 0500  Protime-INR  Tomorrow morning,   STAT     10/20/18 0214   10/20/18 0418  Vitamin B12  Add-on,   AD     10/20/18 0417   10/20/18 0418  Lipase, blood  Add-on,   AD     10/20/18 0417   10/20/18 0417  TSH  Add-on,   AD     10/20/18 0417          Vitals/Pain Today's Vitals   10/20/18 0630 10/20/18 0700 10/20/18 0730 10/20/18 0800  BP: 123/76 128/85 133/89 (!) 151/81  Pulse: 69 76 66 73  Resp: 17 18 18 17   Temp:      TempSrc:      SpO2: 92% 94% 96% 96%  Weight:      Height:      PainSc:        Isolation Precautions No active isolations  Medications Medications  HYDROcodone-acetaminophen (NORCO/VICODIN) 5-325 MG per tablet 1 tablet (1 tablet Oral Given 10/20/18 0232)  atenolol (TENORMIN) tablet 25 mg (has no administration in time range)  furosemide (LASIX) tablet 20 mg (has no administration in time range)  nitroGLYCERIN (NITROSTAT) SL tablet 0.4 mg (has no administration in time range)  levothyroxine (SYNTHROID) tablet 75 mcg (75 mcg Oral Given 10/20/18 0616)  docusate sodium (COLACE) capsule 100 mg (100 mg Oral Given 10/20/18 0233)  oxybutynin (DITROPAN) tablet 5 mg (5 mg Oral Not Given 10/20/18 0705)  clopidogrel (PLAVIX) tablet 75 mg (has no administration in time range)  vitamin B-12 (CYANOCOBALAMIN) tablet 500 mcg (500 mcg Oral Not Given 10/20/18 0710)  ketoconazole (NIZORAL) 2 % cream 1 application (has no administration in time range)  Menthol-Zinc Oxide A999333 % OINT 1 application (has no administration in time range)  triamcinolone cream (KENALOG) 0.1 % 1 application (1 application Topical Not Given 10/20/18 0710)  Warfarin - Pharmacist Dosing Inpatient (has no  administration in time range)  sodium chloride 0.9 % bolus 500 mL (0 mLs Intravenous Stopped 10/19/18 2020)  0.9 %  sodium chloride infusion ( Intravenous Stopping Infusion hung by another clincian 10/20/18 0320)  warfarin (COUMADIN) tablet 4 mg (4 mg Oral Given 10/20/18 0316)    Mobility walks with person assist Moderate fall risk   Focused Assessments    R Recommendations: See Admitting Provider Note  Report given to:   Additional Notes:

## 2018-10-20 NOTE — ED Notes (Signed)
NT assisted pt to use bedside toilet.

## 2018-10-20 NOTE — ED Notes (Signed)
Family remains at bedside with pt. Given brief update.

## 2018-10-20 NOTE — ED Notes (Signed)
DONE Already! PLEASE OBTAIN UA

## 2018-10-21 DIAGNOSIS — Z23 Encounter for immunization: Secondary | ICD-10-CM | POA: Diagnosis not present

## 2018-10-21 LAB — BASIC METABOLIC PANEL
Anion gap: 8 (ref 5–15)
BUN: 21 mg/dL (ref 8–23)
CO2: 28 mmol/L (ref 22–32)
Calcium: 8.3 mg/dL — ABNORMAL LOW (ref 8.9–10.3)
Chloride: 101 mmol/L (ref 98–111)
Creatinine, Ser: 0.91 mg/dL (ref 0.44–1.00)
GFR calc Af Amer: >60 mL/min (ref >60)
GFR calc non Af Amer: 58 mL/min — ABNORMAL LOW (ref >60)
Glucose, Bld: 96 mg/dL (ref 70–99)
Potassium: 3.9 mmol/L (ref 3.5–5.1)
Sodium: 137 mmol/L (ref 135–145)

## 2018-10-21 LAB — CBC
HCT: 40.1 % (ref 36.0–46.0)
Hemoglobin: 12.9 g/dL (ref 12.0–15.0)
MCH: 30.8 pg (ref 26.0–34.0)
MCHC: 32.2 g/dL (ref 30.0–36.0)
MCV: 95.7 fL (ref 80.0–100.0)
Platelets: 141 10*3/uL — ABNORMAL LOW (ref 150–400)
RBC: 4.19 MIL/uL (ref 3.87–5.11)
RDW: 15.1 % (ref 11.5–15.5)
WBC: 4.7 10*3/uL (ref 4.0–10.5)
nRBC: 0 % (ref 0.0–0.2)

## 2018-10-21 LAB — PROTIME-INR
INR: 1.9 — ABNORMAL HIGH (ref 0.8–1.2)
Prothrombin Time: 21.5 seconds — ABNORMAL HIGH (ref 11.4–15.2)

## 2018-10-21 MED ORDER — DOCUSATE SODIUM 100 MG PO CAPS: 100.0000 mg | ORAL_CAPSULE | Freq: Two times a day (BID) | ORAL | Status: DC

## 2018-10-21 MED ORDER — CYANOCOBALAMIN 500 MCG PO TABS: 250.0000 ug | ORAL_TABLET | Freq: Every day | ORAL | Status: DC

## 2018-10-21 MED ORDER — VITAMIN B-12 500 MCG PO TABS: 250.0000 ug | ORAL_TABLET | Freq: Every day | ORAL | 0 refills | Status: DC

## 2018-10-21 MED ORDER — ACETAMINOPHEN 325 MG PO TABS
650.0000 mg | ORAL_TABLET | Freq: Four times a day (QID) | ORAL | Status: DC | PRN
  Administered 2018-10-21: 650 mg via ORAL
  Filled 2018-10-21: qty 2

## 2018-10-21 MED ORDER — WARFARIN SODIUM 4 MG PO TABS: 4.0000 mg | ORAL_TABLET | Freq: Once | ORAL | Status: DC

## 2018-10-21 MED ORDER — WARFARIN SODIUM 5 MG PO TABS
5.0000 mg | ORAL_TABLET | Freq: Once | ORAL | Status: DC
  Filled 2018-10-21: qty 1

## 2018-10-21 NOTE — Discharge Summary (Signed)
[name] NAME: Nicole Walton    MR#:  [id number]  DATE OF BIRTH:  [birth year]  DATE OF ADMISSION:  10/19/2018 ADMITTING PHYSICIAN: Lang Snow, NP  DATE OF DISCHARGE: 10/21/2018  PRIMARY CARE PHYSICIAN: Tracie Harrier, MD    ADMISSION DIAGNOSIS:  AKI (acute kidney injury) ([place]) [N17.9] Abdominal pain [R10.9] Altered mental status, unspecified altered mental status type [R41.82]  DISCHARGE DIAGNOSIS:  TIA  SECONDARY DIAGNOSIS:   Past Medical History:  Diagnosis Date  . Atrial fibrillation ([place])   . Dysrhythmia    A-fib  . Edema    feet/legs  . Hypothyroidism   . Incontinence   . Shortness of breath dyspnea    with exersion  . Sleep apnea   . Stroke ([place])   . TIA (transient ischemic attack)   . TIA (transient ischemic attack)     HOSPITAL COURSE:   85 year old female with past medical history ofCVA,atrial fibrillation on Coumadin, sleep apnea, CA rectum, hypothyroidism, and urinary incontinencepresenting to the ED with altered mental status  1.Altered mental status -initial concerns for ischemic event given history of CVA and atrial fibrillation with underlying vascular dementia-- this episode could very well be TIA given the presentation of mild facial drool on the right noted by daughter in law -CT head and MRI brain reviewed and showed no acute intracranial abnormality -UA negative for UTI -Chest x-ray shows no active cardiopulmonary disease - per daughter in law patient is on warfarin for multiple strokes and TIA in the past. Patient has been on warfarin. INR is 1.8.  2.Abdominal pain -Also with elevated LFTs and bilirubin - ultrasound abdomen no acute abnormality -PRN antiemetics -Continue to monitor CMP  3.Acute kidney injury-- prerenal azotemia -Hold nephrotoxins -received IVhydration  4.Chronic atrial fibrillation-rate controlled on atenolol -Continue  Coumadin pharmacy to dose and monitor  5.Hypothyroidism-continue Synthroid  6.DVT prophylaxis-Therapeutically anti-coagulated with warfarin  7.low grade fever x1 BC so far neg Pt overall feels better. She is agreeable and wants to go home.  HHPT/RN to be arranged  D/w family in the room  CONSULTS OBTAINED:    DRUG ALLERGIES:   Allergies  Allergen Reactions  . Ciprofloxacin Other (See Comments)    Not sure of reaction   . Contrast Media [Iodinated Diagnostic Agents] Swelling    Of feet and legs.  (Betadine is OK to use)  . Iodine Other (See Comments)  . Nitrofurantoin Other (See Comments)    GI distress  . Penicillin G Other (See Comments)    Has patient had a PCN reaction causing immediate rash, facial/tongue/throat swelling, SOB or lightheadedness with hypotension: No Has patient had a PCN reaction causing severe rash involving mucus membranes or skin necrosis: No Has patient had a PCN reaction that required hospitalization: No Has patient had a PCN reaction occurring within the last 10 years: No If all of the above answers are "NO", then may proceed with Cephalosporin use.    DISCHARGE MEDICATIONS:   Allergies as of 10/21/2018      Reactions   Ciprofloxacin Other (See Comments)   Not sure of reaction    Contrast Media [iodinated Diagnostic Agents] Swelling   Of feet and legs.  (Betadine is OK to use)   Iodine Other (See Comments)   Nitrofurantoin Other (See Comments)   GI distress   Penicillin G Other (See Comments)   Has patient had a PCN reaction causing immediate rash, facial/tongue/throat swelling, SOB or lightheadedness with hypotension:  No Has patient had a PCN reaction causing severe rash involving mucus membranes or skin necrosis: No Has patient had a PCN reaction that required hospitalization: No Has patient had a PCN reaction occurring within the last 10 years: No If all of the above answers are "NO", then may proceed with Cephalosporin use.       Medication List    STOP taking these medications   methylPREDNISolone 4 MG Tbpk tablet Commonly known as: MEDROL DOSEPAK     TAKE these medications   atenolol 25 MG tablet Commonly known as: TENORMIN Take 1 tablet by mouth daily.   CALCIUM 500 PO Take 1 tablet by mouth daily.   Calmoseptine 0.44-20.6 % Oint Generic drug: Menthol-Zinc Oxide Apply 1 application topically daily.   clopidogrel 75 MG tablet Commonly known as: PLAVIX Take 75 mg by mouth daily.   docusate sodium 100 MG capsule Commonly known as: COLACE Take 100 mg by mouth 2 (two) times daily as needed for mild constipation.   furosemide 40 MG tablet Commonly known as: LASIX Take 40-80 mg by mouth daily as needed. What changed: Another medication with the same name was removed. Continue taking this medication, and follow the directions you see here.   HYDROcodone-acetaminophen 5-325 MG tablet Commonly known as: NORCO/VICODIN Take 1 tablet by mouth every 6 (six) hours as needed. For back pain   hydrOXYzine 25 MG tablet Commonly known as: ATARAX/VISTARIL Take 1 tablet by mouth 3 (three) times daily as needed.   ketoconazole 2 % cream Commonly known as: NIZORAL Apply 1 application topically daily.   KRILL OIL PO Take 1 tablet by mouth as needed.   levothyroxine 75 MCG tablet Commonly known as: SYNTHROID Take 75 mcg by mouth daily before breakfast. What changed: Another medication with the same name was removed. Continue taking this medication, and follow the directions you see here.   nitroGLYCERIN 0.4 MG SL tablet Commonly known as: NITROSTAT Place 0.4 mg under the tongue every 5 (five) minutes as needed for chest pain.   oxybutynin 5 MG tablet Commonly known as: DITROPAN Take 5 mg by mouth 2 (two) times daily.   triamcinolone cream 0.1 % Commonly known as: KENALOG Apply 1 application topically 2 (two) times daily.   vitamin B-12 500 MCG tablet Commonly known as: CYANOCOBALAMIN Take 0.5  tablets (250 mcg total) by mouth daily. What changed:   how much to take  when to take this   warfarin 4 MG tablet Commonly known as: COUMADIN TAKE 1 TABLET (4MG ) DAILY [facility]. (TAKE 5MG  ON SATURDAY AND SUNDAY AS DIRECTED)   warfarin 5 MG tablet Commonly known as: COUMADIN Take 5 mg by mouth as directed. Every Saturday and Sunday evening   ZyrTEC Allergy 10 MG Caps Generic drug: Cetirizine HCl Take 1 tablet by mouth daily as needed.       If you experience worsening of your admission symptoms, develop shortness of breath, life threatening emergency, suicidal or homicidal thoughts you must seek medical attention immediately by calling 911 or calling your MD immediately  if symptoms less severe.  You Must read complete instructions/literature along with all the possible adverse reactions/side effects for all the Medicines you take and that have been prescribed to you. Take any new Medicines after you have completely understood and accept all the possible adverse reactions/side effects.   Please note  You were cared for by a hospitalist during your hospital stay. If you have any questions about your discharge medications or the  care you received while you were in the hospital after you are discharged, you can call the unit and asked to speak with the hospitalist on call if the hospitalist that took care of you is not available. Once you are discharged, your primary care physician will handle any further medical issues. Please note that NO REFILLS for any discharge medications will be authorized once you are discharged, as it is imperative that you return to your primary care physician (or establish a relationship with a primary care physician if you do not have one) for your aftercare needs so that they can reassess your need for medications and monitor your lab values. Today   SUBJECTIVE   Pt overall feels better. No fever today Ate some BF dter-in-law at  bedside.  VITAL SIGNS:  Blood pressure (!) 143/75, pulse 65, temperature 98.3 F (36.8 C), temperature source Oral, resp. rate 18, height 5\' 3"  (1.6 m), weight 78.8 kg, SpO2 96 %.  I/O:    Intake/Output Summary (Last 24 hours) at 10/21/2018 1412 Last data filed at 10/21/2018 1230 Gross per 24 hour  Intake -  Output 700 ml  Net -700 ml    PHYSICAL EXAMINATION:  GENERAL:  85 y.o.-year-old patient lying in the bed with no acute distress. obese EYES: Pupils equal, round, reactive to light and accommodation. No scleral icterus. Extraocular muscles intact.  HEENT: Head atraumatic, normocephalic. Oropharynx and nasopharynx clear.  NECK:  Supple, no jugular venous distention. No thyroid enlargement, no tenderness.  LUNGS: Normal breath sounds bilaterally, no wheezing, rales,rhonchi or crepitation. No use of accessory muscles of respiration.  CARDIOVASCULAR: S1, S2 normal. No murmurs, rubs, or gallops.  ABDOMEN: Soft, non-tender, non-distended. Bowel sounds present. No organomegaly or mass. Abdominal obesity EXTREMITIES: mild pedal edema, no cyanosis, or clubbing.  NEUROLOGIC: Cranial nerves II through XII are intact. Muscle strength 5/5 in all extremities. Sensation intact. Gait not checked. gen weakness PSYCHIATRIC: patient is alert and oriented x 3.  SKIN: No obvious rash, lesion, or ulcer.   DATA REVIEW:   CBC  Recent Labs  Lab 10/21/18 0102  WBC 4.7  HGB 12.9  HCT 40.1  PLT 141*    Chemistries  Recent Labs  Lab 10/19/18 1808 10/21/18 0536  NA 139 137  K 3.9 3.9  CL 100 101  CO2 27 28  GLUCOSE 124* 96  BUN 20 21  CREATININE 1.19* 0.91  CALCIUM 8.8* 8.3*  AST 115*  --   ALT 47*  --   ALKPHOS 100  --   BILITOT 1.7*  --     Microbiology Results   Recent Results (from the past 240 hour(s))  SARS Coronavirus 2 [facility] order, Performed in [facility] hospital lab) Nasopharyngeal Nasopharyngeal Swab     Status: None   Collection Time: 10/19/18  7:58 PM    Specimen: Nasopharyngeal Swab  Result Value Ref Range Status   SARS Coronavirus 2 NEGATIVE NEGATIVE Final    Comment: (NOTE) If result is NEGATIVE SARS-CoV-2 target nucleic acids are NOT DETECTED. The SARS-CoV-2 RNA is generally detectable in upper and lower  respiratory specimens during the acute phase of infection. The lowest  concentration of SARS-CoV-2 viral copies this assay can detect is 250  copies / mL. A negative result does not preclude SARS-CoV-2 infection  and should not be used as the sole basis for treatment or other  patient management decisions.  A negative result may occur with  improper specimen collection / handling, submission of specimen other  than  nasopharyngeal swab, presence of viral mutation(s) within the  areas targeted by this assay, and inadequate number of viral copies  (<250 copies / mL). A negative result must be combined with clinical  observations, patient history, and epidemiological information. If result is POSITIVE SARS-CoV-2 target nucleic acids are DETECTED. The SARS-CoV-2 RNA is generally detectable in upper and lower  respiratory specimens dur ing the acute phase of infection.  Positive  results are indicative of active infection with SARS-CoV-2.  Clinical  correlation with patient history and other diagnostic information is  necessary to determine patient infection status.  Positive results do  not rule out bacterial infection or co-infection with other viruses. If result is PRESUMPTIVE POSTIVE SARS-CoV-2 nucleic acids MAY BE PRESENT.   A presumptive positive result was obtained on the submitted specimen  and confirmed on repeat testing.  While 2019 novel coronavirus  (SARS-CoV-2) nucleic acids may be present in the submitted sample  additional confirmatory testing may be necessary for epidemiological  and / or clinical management purposes  to differentiate between  SARS-CoV-2 and other Sarbecovirus currently known to infect humans.  If  clinically indicated additional testing with an alternate test  methodology [phone]) is advised. The SARS-CoV-2 RNA is generally  detectable in upper and lower respiratory sp ecimens during the acute  phase of infection. The expected result is Negative. Fact Sheet for Patients:  StrictlyIdeas.no Fact Sheet for Healthcare Providers: BankingDealers.co.za This test is not yet approved or cleared by the [place] FDA and has been authorized for detection and/or diagnosis of SARS-CoV-2 by FDA under an Emergency Use Authorization (EUA).  This EUA will remain in effect (meaning this test can be used) for the duration of the COVID-19 declaration under Section 564(b)(1) of the Act, 21 U.S.C. section 360bbb-3(b)(1), unless the authorization is terminated or revoked sooner. Performed at [facility], [address] [place], [place] [address]   CULTURE, BLOOD (ROUTINE X 2) w Reflex to ID Panel     Status: None (Preliminary result)   Collection Time: 10/21/18  1:03 AM   Specimen: BLOOD  Result Value Ref Range Status   Specimen Description BLOOD LEFT ANTECUBITAL  Final   Special Requests   Final    BOTTLES DRAWN AEROBIC AND ANAEROBIC Blood Culture results may not be optimal due to an excessive volume of blood received in culture bottles   Culture   Final    NO GROWTH < 12 HOURS Performed at [facility], [address] [place], [place] [address]    Report Status PENDING  Incomplete  CULTURE, BLOOD (ROUTINE X 2) w Reflex to ID Panel     Status: None (Preliminary result)   Collection Time: 10/21/18  1:04 AM   Specimen: BLOOD  Result Value Ref Range Status   Specimen Description BLOOD BLOOD RIGHT HAND  Final   Special Requests   Final    BOTTLES DRAWN AEROBIC AND ANAEROBIC Blood Culture results may not be optimal due to an excessive volume of blood received in culture bottles   Culture   Final    NO GROWTH < 12  HOURS Performed at [facility], [address] [place], [place] [address]    Report Status PENDING  Incomplete    RADIOLOGY:  Ct Head Wo Contrast  Result Date: 10/19/2018 CLINICAL DATA:  Stroke-like symptoms EXAM: CT HEAD WITHOUT CONTRAST TECHNIQUE: Contiguous axial images were obtained from the base of the skull through the vertex without intravenous contrast. COMPARISON:  12/06/2016 FINDINGS: Brain:  Mild atrophic changes and chronic white matter ischemic changes are noted. No findings to suggest acute hemorrhage, acute infarction or space-occupying mass lesion are seen. Old left cerebellar infarct is noted. Vascular: No hyperdense vessel or unexpected calcification. Skull: Normal. Negative for fracture or focal lesion. Sinuses/Orbits: No acute finding. Other: None. IMPRESSION: Chronic atrophic and ischemic changes without acute abnormality. Electronically Signed   By: Inez Catalina M.D.   On: 10/19/2018 19:10   Mr Brain Wo Contrast  Result Date: 10/19/2018 CLINICAL DATA:  85 year old female with altered mental status, stroke-like symptoms. EXAM: MRI HEAD WITHOUT CONTRAST TECHNIQUE: Multiplanar, multiecho pulse sequences of the brain and surrounding structures were obtained without intravenous contrast. COMPARISON:  Head CT earlier today.  Brain MRI 02/25/2016. FINDINGS: Brain: No restricted diffusion to suggest acute infarction. No midline shift, mass effect, evidence of mass lesion, ventriculomegaly, extra-axial collection or acute intracranial hemorrhage. Cervicomedullary junction and pituitary are within normal limits. Stable gray and white matter signal throughout the brain. 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 Small area of chronic cortical encephalomalacia at the posterior operculum. Chronic microhemorrhage in the left anterior centrum semiovale. Negative for age gray and white matter signal elsewhere. Vascular: Major intracranial vascular flow voids are stable since 2018.  Skull and upper cervical spine: Partially visible advanced widespread cervical spine degeneration. Multilevel degenerative spondylolisthesis (series 13, image 11. Visualized bone marrow signal is within normal limits. Sinuses/Orbits: Stable and negative. Other: Mastoids remain clear. Visible internal auditory structures appear normal. Scalp and face soft tissues appear negative. IMPRESSION: No acute intracranial abnormality. Stable non-contrast MRI appearance of the brain since 2018, small chronic infarcts in the left cerebellum and right operculum. Electronically Signed   By: Genevie Ann M.D.   On: 10/19/2018 22:43   Dg Chest Portable 1 View  Result Date: 10/19/2018 CLINICAL DATA:  Altered mental status EXAM: PORTABLE CHEST 1 VIEW COMPARISON:  08/16/2010 FINDINGS: No consolidation or effusion. Mild cardiomegaly with aortic atherosclerosis. No pneumothorax. IMPRESSION: No active disease.  Mild cardiomegaly Electronically Signed   By: Donavan Foil M.D.   On: 10/19/2018 19:14   US Abdomen Limited Ruq  Result Date: 10/20/2018 CLINICAL DATA:  Abdominal pain.  Cholecystectomy. EXAM: ULTRASOUND ABDOMEN LIMITED RIGHT UPPER QUADRANT COMPARISON:  No recent prior. FINDINGS: Gallbladder: Cholecystectomy. Common bile duct: Diameter: 6.7 mm Liver: No focal lesion identified. Within normal limits in parenchymal echogenicity. Portal vein is patent on color Doppler imaging with normal direction of blood flow towards the liver. Other: None. IMPRESSION: 1.  Prior cholecystectomy.  No biliary distention. 2.  No acute abnormality identified. Electronically Signed   By: Marcello Moores  Register   On: 10/20/2018 05:17     CODE STATUS:     Code Status Orders  (From admission, onward)         Start     Ordered   10/19/18 2347  Full code  Continuous     10/19/18 2348        Code Status History    Date Active Date Inactive Code Status Order ID Comments User Context   07/13/2017 0318 07/14/2017 2214 Full Code [id number]   Arta Silence, MD ED   02/25/2016 0533 02/25/2016 1838 Full Code [id number]  Saundra Shelling, MD Inpatient   Advance Care Planning Activity      TOTAL TIME TAKING CARE OF THIS PATIENT: 40 minutes.    Fritzi Mandes M.D on 10/21/2018 at 2:12 PM  Between 7am to 6pm - Pager - [phone] After 6pm go to [email or web address] - password  [facility]  Office  [phone]  CC: Primary care physician; Tracie Harrier, MD

## 2018-10-21 NOTE — Evaluation (Signed)
Physical Therapy Evaluation Patient Details Name: Nicole Walton MRN: [id number] DOB: 1931-10-13 Today's Date: 10/21/2018   History of Present Illness  H&P per MD: Pt is 85 year old female with past medical history of CVA, atrial fibrillation on Coumadin, sleep apnea, CA rectum, hypothyroidism, and urinary incontinence presenting to the ED with altered mental status. Episode began with patient's family noticing she was not acting at baseline. When they arrived they found her confused with mild left facial drooping without other focal neurologic deficit.  Prior to that episode, patient has had intermittent episodes of confusion and short-term memory loss worse since her last stroke.  Per daughter-in-law, patient's gait is unsteady causing her to fall several weeks ago. Patient's family got concerned that she may have had another stroke due to a history of atrial fibrillation and several TIAs and CVA.  Family called EMS who brought her to the ED for further evaluation. CT head and MRI brain reviewed and showed no acute intracranial abnormality. PMH includes: Altered mental status, abdominal pain, acute kidney injury, Chronic A-fib, Hypotension, DVT prophalaxis with warfarin.  Clinical Impression  Pt presented with deficits in strength, transfers, gait, and balance. Pt completed bed mobility and exercises in long sitting with ease. Transfers and gait were a bit less steady and required CGA, but SpO2 was within 92-98%, and her HR was within 68-100 during the session. Pt ambulated 200' without tiring and required min cuing to keep her on-task and cued sequencing with RW. Per daughter-in-law, minus the addition of the RW, pt gait looked close to her baseline. Pt will benefit from HHPT services upon discharge to safely address above deficits for decreased caregiver assistance and eventual return to PLOF.      Follow Up Recommendations Home health PT    Equipment Recommendations  Rolling walker with 5"  wheels    Recommendations for Other Services       Precautions / Restrictions Precautions Precautions: Fall Precaution Comments: Pt can be overeager to get moving Restrictions Weight Bearing Restrictions: No      Mobility  Bed Mobility Overal bed mobility: Independent             General bed mobility comments: Pt able to mobilize with ease  Transfers Overall transfer level: Needs assistance Equipment used: Rolling walker (2 wheeled)             General transfer comment: Pt transferred with CGA  Ambulation/Gait Ambulation/Gait assistance: Min guard Gait Distance (Feet): 200 Feet Assistive device: Rolling walker (2 wheeled) Gait Pattern/deviations: Step-through pattern;Decreased step length - right;Decreased step length - left Gait velocity: Decreased   General Gait Details: Pt ambulated with minimal unsteadiness and leaning on the RW, instinct was to let the [place] get too far ahead  Stairs            Wheelchair Mobility    Modified Rankin (Stroke Patients Only)       Balance Overall balance assessment: Mild deficits observed, not formally tested                                           Pertinent Vitals/Pain Pain Assessment: No/denies pain    Home Living Family/patient expects to be discharged to:: Private residence Living Arrangements: Spouse/significant other Available Help at Discharge: Family;Available PRN/intermittently Type of Home: House Home Access: Ramped entrance     Home Layout: One level Home Equipment: Gilford Rile -  2 wheels;Walker - 4 wheels(Rolator's rubber wheels have rotted off, and spouse is using RW)      Prior Function Level of Independence: Independent         Comments: Pt can complete ADLs around the house, does yardwork most days, but does not drive nor does she travel much generally     Hand Dominance   Dominant Hand: Right    Extremity/Trunk Assessment   Upper Extremity Assessment Upper  Extremity Assessment: Generalized weakness    Lower Extremity Assessment Lower Extremity Assessment: Generalized weakness    Cervical / Trunk Assessment Cervical / Trunk Assessment: Normal  Communication   Communication: Receptive difficulties;Other (comment)(Unclear if pt was not hearing or was not fully understanding PT questions and cuing.)  Cognition Arousal/Alertness: Awake/alert Behavior During Therapy: WFL for tasks assessed/performed Overall Cognitive Status: Within Functional Limits for tasks assessed                                 General Comments: Pt is not a reliable historian, and daughter-in-law spoke up and answered most questions.      General Comments      Exercises Total Joint Exercises Ankle Circles/Pumps: AROM;Both;10 reps Quad Sets: AROM;Strengthening;Both;10 reps Heel Slides: AROM;Strengthening;Both;10 reps Marching in Standing: AROM;Strengthening;Both;10 reps   Assessment/Plan    PT Assessment Patient needs continued PT services  PT Problem List Decreased strength;Decreased range of motion;Decreased activity tolerance;Decreased balance;Decreased mobility;Decreased cognition;Decreased safety awareness;Cardiopulmonary status limiting activity;Impaired sensation       PT Treatment Interventions Therapeutic exercise;Therapeutic activities    PT Goals (Current goals can be found in the Care Plan section)  Acute Rehab PT Goals Patient Stated Goal: Return to PLOF at home PT Goal Formulation: With patient/family Time For Goal Achievement: 11/03/18 Potential to Achieve Goals: Fair    Frequency Min 2X/week   Barriers to discharge        Co-evaluation               AM-PAC PT "6 Clicks" Mobility  Outcome Measure Help needed turning from your back to your side while in a flat bed without using bedrails?: None Help needed moving from lying on your back to sitting on the side of a flat bed without using bedrails?: A Little Help  needed moving to and from a bed to a chair (including a wheelchair)?: None Help needed standing up from a chair using your arms (e.g., wheelchair or bedside chair)?: A Little Help needed to walk in hospital room?: A Little Help needed climbing 3-5 steps with a railing? : A Lot 6 Click Score: 19    End of Session Equipment Utilized During Treatment: Gait belt Activity Tolerance: No increased pain;Patient tolerated treatment well Patient left: in chair;with call bell/phone within reach;with chair alarm set;with family/visitor present Nurse Communication: Mobility status PT Visit Diagnosis: Difficulty in walking, not elsewhere classified (R26.2);Muscle weakness (generalized) (M62.81);History of falling (Z91.81)    Time: [id number] PT Time Calculation (min) (ACUTE ONLY): 41 min   Charges:            .

## 2018-10-21 NOTE — Progress Notes (Signed)
Patient with fever overnight without obvious cause.  Urinalysis negative for UTI.  Chest x-ray shows no active cardiopulmonary process.  Will obtain blood cultures at this time and continue to monitor fever curve and CBC in the morning.  Rufina Falco, DNP, CCRN, FNP- [facility] Nurse Practitioner Between 7am to 6pm - Pager [phone]  After 6pm go to [email or web address] - Proofreader  [employer]  [phone]

## 2018-10-21 NOTE — TOC Transition Note (Signed)
Transition of Care [facility]) - CM/SW Discharge Note   Patient Details  Name: Nicole Walton MRN: [id number] Date of Birth: December 31, 1931  Transition of Care [facility]) CM/SW Contact:  Elza Rafter, RN Phone Number: 10/21/2018, 2:14 PM   Clinical Narrative:   Patient is discharging to home with daughter in law.  She is from home with husband who has dementia.  The plan is to move both she and her husband into the son and daughter in laws home very soon.  She has a walker but does not use it at home.  Current with Dr. Ginette Pitman; obtains medications without difficulty at [facility] on [address] or via mail order through [facility].  Offered home health services.  Referral to [facility] for RN, PT and aide.  Accepted by Helene Kelp.  No further needs identified at this time by TOC.    Geoffry Paradise, BSN, RN Care Manager [phone]             Patient Goals and CMS Choice        Discharge Placement                       Discharge Plan and Services                                     Social Determinants of Health (SDOH) Interventions     Readmission Risk Interventions No flowsheet data found.

## 2018-10-21 NOTE — Progress Notes (Addendum)
ANTICOAGULATION CONSULT NOTE - Initial Consult  Pharmacy Consult for Coumadin (pt takes at home) Indication: atrial fibrillation  Allergies  Allergen Reactions  . Ciprofloxacin Other (See Comments)    Not sure of reaction   . Contrast Media [Iodinated Diagnostic Agents] Swelling    Of feet and legs.  (Betadine is OK to use)  . Iodine Other (See Comments)  . Nitrofurantoin Other (See Comments)    GI distress  . Penicillin G Other (See Comments)    Has patient had a PCN reaction causing immediate rash, facial/tongue/throat swelling, SOB or lightheadedness with hypotension: No Has patient had a PCN reaction causing severe rash involving mucus membranes or skin necrosis: No Has patient had a PCN reaction that required hospitalization: No Has patient had a PCN reaction occurring within the last 10 years: No If all of the above answers are "NO", then may proceed with Cephalosporin use.    Patient Measurements: Height: 5\' 3"  (160 cm) Weight: 173 lb 11.2 oz (78.8 kg) IBW/kg (Calculated) : 52.4  Vital Signs: Temp: 98.3 F (36.8 C) (09/16 0432) Temp Source: Oral (09/16 0432) BP: 145/87 (09/16 0432) Pulse Rate: 58 (09/16 0432)  Labs: Recent Labs    10/19/18 1808 10/21/18 0536  HGB 14.4  --   HCT 44.1  --   PLT 166  --   LABPROT 20.8* 21.5*  INR 1.8* 1.9*  CREATININE 1.19* 0.91    Estimated Creatinine Clearance: 45.8 mL/min (by C-G formula based on SCr of 0.91 mg/dL).   Medical History: Past Medical History:  Diagnosis Date  . Atrial fibrillation ([place])   . Dysrhythmia    A-fib  . Edema    feet/legs  . Hypothyroidism   . Incontinence   . Shortness of breath dyspnea    with exersion  . Sleep apnea   . Stroke ([place])   . TIA (transient ischemic attack)   . TIA (transient ischemic attack)     Medications:  Medications Prior to Admission  Medication Sig Dispense Refill Last Dose  . atenolol (TENORMIN) 25 MG tablet Take 1 tablet by mouth daily.   10/18/2018 at 1900   . Calcium-Magnesium-Vitamin D (CALCIUM 500 PO) Take 1 tablet by mouth daily.   Unknown at Unknown  . Cetirizine HCl (ZYRTEC ALLERGY) 10 MG CAPS Take 1 tablet by mouth daily as needed.    prn at prn  . clopidogrel (PLAVIX) 75 MG tablet Take 75 mg by mouth daily.    10/18/2018 at 1900  . docusate sodium (COLACE) 100 MG capsule Take 100 mg by mouth 2 (two) times daily as needed for mild constipation.   prn at prn  . furosemide (LASIX) 40 MG tablet Take 40-80 mg by mouth daily as needed.    prn at prn  . HYDROcodone-acetaminophen (NORCO/VICODIN) 5-325 MG tablet Take 1 tablet by mouth every 6 (six) hours as needed. For back pain   prn at prn  . hydrOXYzine (ATARAX/VISTARIL) 25 MG tablet Take 1 tablet by mouth 3 (three) times daily as needed.   PRN at PRN  . levothyroxine (SYNTHROID) 75 MCG tablet Take 75 mcg by mouth daily before breakfast.    10/19/2018 at 0700  . oxybutynin (DITROPAN) 5 MG tablet Take 5 mg by mouth 2 (two) times daily.    10/19/2018 at 0700  . vitamin B-12 (CYANOCOBALAMIN) 500 MCG tablet Take 1 tablet by mouth 2 (two) times daily.   10/19/2018 at 0700  . warfarin (COUMADIN) 4 MG tablet TAKE 1 TABLET (4MG ) DAILY  [place]. (TAKE 5MG  ON SATURDAY AND SUNDAY AS DIRECTED)   Past Week at Unknown time  . warfarin (COUMADIN) 5 MG tablet Take 5 mg by mouth as directed. Every Saturday and Sunday evening   10/18/2018 at 2100  . furosemide (LASIX) 40 MG tablet    Not Taking at Unknown time  . ketoconazole (NIZORAL) 2 % cream Apply 1 application topically daily.   Not Taking at Unknown  . KRILL OIL PO Take 1 tablet by mouth as needed.   Not Taking at Unknown time  . levothyroxine (SYNTHROID, LEVOTHROID) 75 MCG tablet Take by mouth.     . Menthol-Zinc Oxide (CALMOSEPTINE) 0.44-20.6 % OINT Apply 1 application topically daily.   Not Taking at Unknown time  . methylPREDNISolone (MEDROL DOSEPAK) 4 MG TBPK tablet Take T[place] dose as directed (Patient not taking: Reported on 10/19/2018) 21 tablet  0 Not Taking at Unknown time  . nitroGLYCERIN (NITROSTAT) 0.4 MG SL tablet Place 0.4 mg under the tongue every 5 (five) minutes as needed for chest pain.   Not Taking at prn  . triamcinolone cream (KENALOG) 0.1 % Apply 1 application topically 2 (two) times daily.   Not Taking at Unknown time    Assessment: Asked to resume Warfarin from home.  Pt reportedly takes 4mg  Mon thru Fri and 5mg  on Sat and Sun. INR is below goal.    9/15: INR 1.8 9/16: INR 1.9  Goal of Therapy:  INR 2-3 Monitor platelets by anticoagulation protocol: Yes   Plan:  Will order Coumadin 5mg  x 1 Monitor INR and adjust per protocol  Mackson Botz A Juelle Dickmann 10/21/2018,7:21 AM

## 2018-10-26 ENCOUNTER — Other Ambulatory Visit: Payer: Self-pay

## 2018-10-26 LAB — CULTURE, BLOOD (ROUTINE X 2)
Culture: NO GROWTH
Culture: NO GROWTH

## 2018-10-26 NOTE — Patient Outreach (Signed)
[place] [facility]) Care Management  10/26/2018  Nicole Walton [birth year] [id number]   RED ON EMMI ALERT Red Alert Reason: General Discharge. Per notification, patient has not scheduled a follow-up. Patient was discharged from [facility] on 10/21/18.   Attempted initial outreach. Outreach unsuccessful. Will mail unsuccessful outreach letter and follow-up within 3-4 business days.    [place] Care Management [phone]

## 2018-10-30 ENCOUNTER — Other Ambulatory Visit: Payer: Self-pay

## 2018-10-30 DIAGNOSIS — E039 Hypothyroidism, unspecified: Secondary | ICD-10-CM | POA: Diagnosis not present

## 2018-10-30 DIAGNOSIS — Z6831 Body mass index (BMI) 31.0-31.9, adult: Secondary | ICD-10-CM | POA: Diagnosis not present

## 2018-10-30 DIAGNOSIS — E860 Dehydration: Secondary | ICD-10-CM | POA: Diagnosis not present

## 2018-10-30 DIAGNOSIS — C20 Malignant neoplasm of rectum: Secondary | ICD-10-CM | POA: Diagnosis not present

## 2018-10-30 DIAGNOSIS — R945 Abnormal results of liver function studies: Secondary | ICD-10-CM | POA: Diagnosis not present

## 2018-10-30 DIAGNOSIS — Z79899 Other long term (current) drug therapy: Secondary | ICD-10-CM | POA: Diagnosis not present

## 2018-10-30 DIAGNOSIS — Z0000 Encounter for general adult medical examination without abnormal findings: Secondary | ICD-10-CM | POA: Diagnosis not present

## 2018-10-30 DIAGNOSIS — I482 Chronic atrial fibrillation, unspecified: Secondary | ICD-10-CM | POA: Diagnosis not present

## 2018-10-30 DIAGNOSIS — Z8673 Personal history of transient ischemic attack (TIA), and cerebral infarction without residual deficits: Secondary | ICD-10-CM | POA: Diagnosis not present

## 2018-10-30 NOTE — Patient Outreach (Signed)
[place] [facility]) Care Management  10/30/2018  TAWNIE BATTERSON [birth year] [id number]    2nd unsuccessful outreach attempt. Will follow-up within 3-4 business days.    [place] Care Management [phone]

## 2018-11-03 DIAGNOSIS — I272 Pulmonary hypertension, unspecified: Secondary | ICD-10-CM | POA: Diagnosis not present

## 2018-11-03 DIAGNOSIS — C20 Malignant neoplasm of rectum: Secondary | ICD-10-CM | POA: Diagnosis not present

## 2018-11-03 DIAGNOSIS — I10 Essential (primary) hypertension: Secondary | ICD-10-CM | POA: Diagnosis not present

## 2018-11-03 DIAGNOSIS — M1990 Unspecified osteoarthritis, unspecified site: Secondary | ICD-10-CM | POA: Diagnosis not present

## 2018-11-03 DIAGNOSIS — Z8673 Personal history of transient ischemic attack (TIA), and cerebral infarction without residual deficits: Secondary | ICD-10-CM | POA: Diagnosis not present

## 2018-11-03 DIAGNOSIS — I361 Nonrheumatic tricuspid (valve) insufficiency: Secondary | ICD-10-CM | POA: Diagnosis not present

## 2018-11-03 DIAGNOSIS — I739 Peripheral vascular disease, unspecified: Secondary | ICD-10-CM | POA: Diagnosis not present

## 2018-11-03 DIAGNOSIS — I480 Paroxysmal atrial fibrillation: Secondary | ICD-10-CM | POA: Diagnosis not present

## 2018-11-03 DIAGNOSIS — G4733 Obstructive sleep apnea (adult) (pediatric): Secondary | ICD-10-CM | POA: Diagnosis not present

## 2018-11-04 DIAGNOSIS — C20 Malignant neoplasm of rectum: Secondary | ICD-10-CM | POA: Diagnosis not present

## 2018-11-04 DIAGNOSIS — I480 Paroxysmal atrial fibrillation: Secondary | ICD-10-CM | POA: Diagnosis not present

## 2018-11-04 DIAGNOSIS — M1990 Unspecified osteoarthritis, unspecified site: Secondary | ICD-10-CM | POA: Diagnosis not present

## 2018-11-04 DIAGNOSIS — I10 Essential (primary) hypertension: Secondary | ICD-10-CM | POA: Diagnosis not present

## 2018-11-04 DIAGNOSIS — I739 Peripheral vascular disease, unspecified: Secondary | ICD-10-CM | POA: Diagnosis not present

## 2018-11-04 DIAGNOSIS — I361 Nonrheumatic tricuspid (valve) insufficiency: Secondary | ICD-10-CM | POA: Diagnosis not present

## 2018-11-04 DIAGNOSIS — G4733 Obstructive sleep apnea (adult) (pediatric): Secondary | ICD-10-CM | POA: Diagnosis not present

## 2018-11-04 DIAGNOSIS — Z8673 Personal history of transient ischemic attack (TIA), and cerebral infarction without residual deficits: Secondary | ICD-10-CM | POA: Diagnosis not present

## 2018-11-04 DIAGNOSIS — I272 Pulmonary hypertension, unspecified: Secondary | ICD-10-CM | POA: Diagnosis not present

## 2018-11-05 ENCOUNTER — Other Ambulatory Visit: Payer: Self-pay

## 2018-11-05 NOTE — Patient Outreach (Signed)
[place] [facility]) Care Management  11/05/2018  Nicole Walton 1931-11-22 [id number]    3rd unsuccessful outreach attempt. Case closure pending.    [place] Care Management [phone]

## 2018-11-09 DIAGNOSIS — Z8673 Personal history of transient ischemic attack (TIA), and cerebral infarction without residual deficits: Secondary | ICD-10-CM | POA: Diagnosis not present

## 2018-11-09 DIAGNOSIS — C20 Malignant neoplasm of rectum: Secondary | ICD-10-CM | POA: Diagnosis not present

## 2018-11-09 DIAGNOSIS — I272 Pulmonary hypertension, unspecified: Secondary | ICD-10-CM | POA: Diagnosis not present

## 2018-11-09 DIAGNOSIS — I10 Essential (primary) hypertension: Secondary | ICD-10-CM | POA: Diagnosis not present

## 2018-11-09 DIAGNOSIS — G4733 Obstructive sleep apnea (adult) (pediatric): Secondary | ICD-10-CM | POA: Diagnosis not present

## 2018-11-09 DIAGNOSIS — M1990 Unspecified osteoarthritis, unspecified site: Secondary | ICD-10-CM | POA: Diagnosis not present

## 2018-11-09 DIAGNOSIS — I480 Paroxysmal atrial fibrillation: Secondary | ICD-10-CM | POA: Diagnosis not present

## 2018-11-09 DIAGNOSIS — I361 Nonrheumatic tricuspid (valve) insufficiency: Secondary | ICD-10-CM | POA: Diagnosis not present

## 2018-11-09 DIAGNOSIS — I739 Peripheral vascular disease, unspecified: Secondary | ICD-10-CM | POA: Diagnosis not present

## 2018-11-10 DIAGNOSIS — Z8673 Personal history of transient ischemic attack (TIA), and cerebral infarction without residual deficits: Secondary | ICD-10-CM | POA: Diagnosis not present

## 2018-11-10 DIAGNOSIS — I10 Essential (primary) hypertension: Secondary | ICD-10-CM | POA: Diagnosis not present

## 2018-11-10 DIAGNOSIS — C20 Malignant neoplasm of rectum: Secondary | ICD-10-CM | POA: Diagnosis not present

## 2018-11-10 DIAGNOSIS — I361 Nonrheumatic tricuspid (valve) insufficiency: Secondary | ICD-10-CM | POA: Diagnosis not present

## 2018-11-10 DIAGNOSIS — I739 Peripheral vascular disease, unspecified: Secondary | ICD-10-CM | POA: Diagnosis not present

## 2018-11-10 DIAGNOSIS — M1990 Unspecified osteoarthritis, unspecified site: Secondary | ICD-10-CM | POA: Diagnosis not present

## 2018-11-10 DIAGNOSIS — I272 Pulmonary hypertension, unspecified: Secondary | ICD-10-CM | POA: Diagnosis not present

## 2018-11-10 DIAGNOSIS — I480 Paroxysmal atrial fibrillation: Secondary | ICD-10-CM | POA: Diagnosis not present

## 2018-11-12 DIAGNOSIS — I480 Paroxysmal atrial fibrillation: Secondary | ICD-10-CM | POA: Diagnosis not present

## 2018-11-12 DIAGNOSIS — I739 Peripheral vascular disease, unspecified: Secondary | ICD-10-CM | POA: Diagnosis not present

## 2018-11-12 DIAGNOSIS — Z8673 Personal history of transient ischemic attack (TIA), and cerebral infarction without residual deficits: Secondary | ICD-10-CM | POA: Diagnosis not present

## 2018-11-12 DIAGNOSIS — I10 Essential (primary) hypertension: Secondary | ICD-10-CM | POA: Diagnosis not present

## 2018-11-12 DIAGNOSIS — I361 Nonrheumatic tricuspid (valve) insufficiency: Secondary | ICD-10-CM | POA: Diagnosis not present

## 2018-11-12 DIAGNOSIS — C20 Malignant neoplasm of rectum: Secondary | ICD-10-CM | POA: Diagnosis not present

## 2018-11-12 DIAGNOSIS — G4733 Obstructive sleep apnea (adult) (pediatric): Secondary | ICD-10-CM | POA: Diagnosis not present

## 2018-11-12 DIAGNOSIS — M1990 Unspecified osteoarthritis, unspecified site: Secondary | ICD-10-CM | POA: Diagnosis not present

## 2018-11-12 DIAGNOSIS — I272 Pulmonary hypertension, unspecified: Secondary | ICD-10-CM | POA: Diagnosis not present

## 2018-11-13 DIAGNOSIS — Z7901 Long term (current) use of anticoagulants: Secondary | ICD-10-CM | POA: Diagnosis not present

## 2018-11-17 DIAGNOSIS — I361 Nonrheumatic tricuspid (valve) insufficiency: Secondary | ICD-10-CM | POA: Diagnosis not present

## 2018-11-17 DIAGNOSIS — I739 Peripheral vascular disease, unspecified: Secondary | ICD-10-CM | POA: Diagnosis not present

## 2018-11-17 DIAGNOSIS — G4733 Obstructive sleep apnea (adult) (pediatric): Secondary | ICD-10-CM | POA: Diagnosis not present

## 2018-11-17 DIAGNOSIS — Z8673 Personal history of transient ischemic attack (TIA), and cerebral infarction without residual deficits: Secondary | ICD-10-CM | POA: Diagnosis not present

## 2018-11-17 DIAGNOSIS — M1990 Unspecified osteoarthritis, unspecified site: Secondary | ICD-10-CM | POA: Diagnosis not present

## 2018-11-17 DIAGNOSIS — I272 Pulmonary hypertension, unspecified: Secondary | ICD-10-CM | POA: Diagnosis not present

## 2018-11-17 DIAGNOSIS — I480 Paroxysmal atrial fibrillation: Secondary | ICD-10-CM | POA: Diagnosis not present

## 2018-11-17 DIAGNOSIS — I10 Essential (primary) hypertension: Secondary | ICD-10-CM | POA: Diagnosis not present

## 2018-11-17 DIAGNOSIS — C20 Malignant neoplasm of rectum: Secondary | ICD-10-CM | POA: Diagnosis not present

## 2018-11-19 DIAGNOSIS — I480 Paroxysmal atrial fibrillation: Secondary | ICD-10-CM | POA: Diagnosis not present

## 2018-11-19 DIAGNOSIS — Z8673 Personal history of transient ischemic attack (TIA), and cerebral infarction without residual deficits: Secondary | ICD-10-CM | POA: Diagnosis not present

## 2018-11-19 DIAGNOSIS — G4733 Obstructive sleep apnea (adult) (pediatric): Secondary | ICD-10-CM | POA: Diagnosis not present

## 2018-11-19 DIAGNOSIS — I10 Essential (primary) hypertension: Secondary | ICD-10-CM | POA: Diagnosis not present

## 2018-11-19 DIAGNOSIS — I272 Pulmonary hypertension, unspecified: Secondary | ICD-10-CM | POA: Diagnosis not present

## 2018-11-19 DIAGNOSIS — I361 Nonrheumatic tricuspid (valve) insufficiency: Secondary | ICD-10-CM | POA: Diagnosis not present

## 2018-11-19 DIAGNOSIS — M1990 Unspecified osteoarthritis, unspecified site: Secondary | ICD-10-CM | POA: Diagnosis not present

## 2018-11-19 DIAGNOSIS — I739 Peripheral vascular disease, unspecified: Secondary | ICD-10-CM | POA: Diagnosis not present

## 2018-11-19 DIAGNOSIS — C20 Malignant neoplasm of rectum: Secondary | ICD-10-CM | POA: Diagnosis not present

## 2018-11-24 DIAGNOSIS — I739 Peripheral vascular disease, unspecified: Secondary | ICD-10-CM | POA: Diagnosis not present

## 2018-11-24 DIAGNOSIS — I272 Pulmonary hypertension, unspecified: Secondary | ICD-10-CM | POA: Diagnosis not present

## 2018-11-24 DIAGNOSIS — I480 Paroxysmal atrial fibrillation: Secondary | ICD-10-CM | POA: Diagnosis not present

## 2018-11-24 DIAGNOSIS — I361 Nonrheumatic tricuspid (valve) insufficiency: Secondary | ICD-10-CM | POA: Diagnosis not present

## 2018-11-24 DIAGNOSIS — C20 Malignant neoplasm of rectum: Secondary | ICD-10-CM | POA: Diagnosis not present

## 2018-11-24 DIAGNOSIS — G4733 Obstructive sleep apnea (adult) (pediatric): Secondary | ICD-10-CM | POA: Diagnosis not present

## 2018-11-24 DIAGNOSIS — Z8673 Personal history of transient ischemic attack (TIA), and cerebral infarction without residual deficits: Secondary | ICD-10-CM | POA: Diagnosis not present

## 2018-11-24 DIAGNOSIS — M1990 Unspecified osteoarthritis, unspecified site: Secondary | ICD-10-CM | POA: Diagnosis not present

## 2018-11-24 DIAGNOSIS — I10 Essential (primary) hypertension: Secondary | ICD-10-CM | POA: Diagnosis not present

## 2018-12-04 DIAGNOSIS — G4733 Obstructive sleep apnea (adult) (pediatric): Secondary | ICD-10-CM | POA: Diagnosis not present

## 2018-12-04 DIAGNOSIS — Z8673 Personal history of transient ischemic attack (TIA), and cerebral infarction without residual deficits: Secondary | ICD-10-CM | POA: Diagnosis not present

## 2018-12-04 DIAGNOSIS — I739 Peripheral vascular disease, unspecified: Secondary | ICD-10-CM | POA: Diagnosis not present

## 2018-12-04 DIAGNOSIS — I480 Paroxysmal atrial fibrillation: Secondary | ICD-10-CM | POA: Diagnosis not present

## 2018-12-04 DIAGNOSIS — I361 Nonrheumatic tricuspid (valve) insufficiency: Secondary | ICD-10-CM | POA: Diagnosis not present

## 2018-12-04 DIAGNOSIS — C20 Malignant neoplasm of rectum: Secondary | ICD-10-CM | POA: Diagnosis not present

## 2018-12-04 DIAGNOSIS — I10 Essential (primary) hypertension: Secondary | ICD-10-CM | POA: Diagnosis not present

## 2018-12-04 DIAGNOSIS — M1990 Unspecified osteoarthritis, unspecified site: Secondary | ICD-10-CM | POA: Diagnosis not present

## 2018-12-04 DIAGNOSIS — I272 Pulmonary hypertension, unspecified: Secondary | ICD-10-CM | POA: Diagnosis not present

## 2018-12-09 DIAGNOSIS — I10 Essential (primary) hypertension: Secondary | ICD-10-CM | POA: Diagnosis not present

## 2018-12-09 DIAGNOSIS — I272 Pulmonary hypertension, unspecified: Secondary | ICD-10-CM | POA: Diagnosis not present

## 2018-12-09 DIAGNOSIS — G4733 Obstructive sleep apnea (adult) (pediatric): Secondary | ICD-10-CM | POA: Diagnosis not present

## 2018-12-09 DIAGNOSIS — I480 Paroxysmal atrial fibrillation: Secondary | ICD-10-CM | POA: Diagnosis not present

## 2018-12-09 DIAGNOSIS — C20 Malignant neoplasm of rectum: Secondary | ICD-10-CM | POA: Diagnosis not present

## 2018-12-09 DIAGNOSIS — I361 Nonrheumatic tricuspid (valve) insufficiency: Secondary | ICD-10-CM | POA: Diagnosis not present

## 2018-12-09 DIAGNOSIS — I739 Peripheral vascular disease, unspecified: Secondary | ICD-10-CM | POA: Diagnosis not present

## 2018-12-09 DIAGNOSIS — Z8673 Personal history of transient ischemic attack (TIA), and cerebral infarction without residual deficits: Secondary | ICD-10-CM | POA: Diagnosis not present

## 2018-12-09 DIAGNOSIS — M1990 Unspecified osteoarthritis, unspecified site: Secondary | ICD-10-CM | POA: Diagnosis not present

## 2018-12-15 DIAGNOSIS — I480 Paroxysmal atrial fibrillation: Secondary | ICD-10-CM | POA: Diagnosis not present

## 2018-12-15 DIAGNOSIS — I10 Essential (primary) hypertension: Secondary | ICD-10-CM | POA: Diagnosis not present

## 2018-12-15 DIAGNOSIS — C20 Malignant neoplasm of rectum: Secondary | ICD-10-CM | POA: Diagnosis not present

## 2018-12-15 DIAGNOSIS — Z7901 Long term (current) use of anticoagulants: Secondary | ICD-10-CM | POA: Diagnosis not present

## 2018-12-15 DIAGNOSIS — G4733 Obstructive sleep apnea (adult) (pediatric): Secondary | ICD-10-CM | POA: Diagnosis not present

## 2018-12-15 DIAGNOSIS — Z8673 Personal history of transient ischemic attack (TIA), and cerebral infarction without residual deficits: Secondary | ICD-10-CM | POA: Diagnosis not present

## 2018-12-15 DIAGNOSIS — I361 Nonrheumatic tricuspid (valve) insufficiency: Secondary | ICD-10-CM | POA: Diagnosis not present

## 2018-12-15 DIAGNOSIS — M1990 Unspecified osteoarthritis, unspecified site: Secondary | ICD-10-CM | POA: Diagnosis not present

## 2018-12-15 DIAGNOSIS — I739 Peripheral vascular disease, unspecified: Secondary | ICD-10-CM | POA: Diagnosis not present

## 2018-12-15 DIAGNOSIS — I272 Pulmonary hypertension, unspecified: Secondary | ICD-10-CM | POA: Diagnosis not present

## 2018-12-23 DIAGNOSIS — F0280 Dementia in other diseases classified elsewhere without behavioral disturbance: Secondary | ICD-10-CM | POA: Diagnosis not present

## 2018-12-23 DIAGNOSIS — Z8673 Personal history of transient ischemic attack (TIA), and cerebral infarction without residual deficits: Secondary | ICD-10-CM | POA: Diagnosis not present

## 2018-12-23 DIAGNOSIS — G309 Alzheimer's disease, unspecified: Secondary | ICD-10-CM | POA: Diagnosis not present

## 2018-12-23 DIAGNOSIS — F0150 Vascular dementia without behavioral disturbance: Secondary | ICD-10-CM | POA: Diagnosis not present

## 2018-12-23 DIAGNOSIS — H9193 Unspecified hearing loss, bilateral: Secondary | ICD-10-CM | POA: Diagnosis not present

## 2018-12-30 DIAGNOSIS — M79674 Pain in right toe(s): Secondary | ICD-10-CM | POA: Diagnosis not present

## 2018-12-30 DIAGNOSIS — L600 Ingrowing nail: Secondary | ICD-10-CM | POA: Diagnosis not present

## 2018-12-30 DIAGNOSIS — M79675 Pain in left toe(s): Secondary | ICD-10-CM | POA: Diagnosis not present

## 2018-12-30 DIAGNOSIS — B351 Tinea unguium: Secondary | ICD-10-CM | POA: Diagnosis not present

## 2019-01-21 DIAGNOSIS — I10 Essential (primary) hypertension: Secondary | ICD-10-CM | POA: Diagnosis not present

## 2019-01-21 DIAGNOSIS — E538 Deficiency of other specified B group vitamins: Secondary | ICD-10-CM | POA: Diagnosis not present

## 2019-01-21 DIAGNOSIS — Z131 Encounter for screening for diabetes mellitus: Secondary | ICD-10-CM | POA: Diagnosis not present

## 2019-01-21 DIAGNOSIS — R8290 Unspecified abnormal findings in urine: Secondary | ICD-10-CM | POA: Diagnosis not present

## 2019-01-21 DIAGNOSIS — Z0000 Encounter for general adult medical examination without abnormal findings: Secondary | ICD-10-CM | POA: Diagnosis not present

## 2019-01-21 DIAGNOSIS — Z13220 Encounter for screening for lipoid disorders: Secondary | ICD-10-CM | POA: Diagnosis not present

## 2019-01-21 DIAGNOSIS — R945 Abnormal results of liver function studies: Secondary | ICD-10-CM | POA: Diagnosis not present

## 2019-01-21 DIAGNOSIS — D649 Anemia, unspecified: Secondary | ICD-10-CM | POA: Diagnosis not present

## 2019-01-21 DIAGNOSIS — Z7901 Long term (current) use of anticoagulants: Secondary | ICD-10-CM | POA: Diagnosis not present

## 2019-01-21 DIAGNOSIS — E039 Hypothyroidism, unspecified: Secondary | ICD-10-CM | POA: Diagnosis not present

## 2019-02-11 DIAGNOSIS — Z7901 Long term (current) use of anticoagulants: Secondary | ICD-10-CM | POA: Diagnosis not present

## 2019-02-13 ENCOUNTER — Emergency Department: Payer: Medicare HMO

## 2019-02-13 ENCOUNTER — Encounter: Payer: Self-pay | Admitting: Emergency Medicine

## 2019-02-13 ENCOUNTER — Other Ambulatory Visit: Payer: Self-pay

## 2019-02-13 ENCOUNTER — Emergency Department
Admission: EM | Admit: 2019-02-13 | Discharge: 2019-02-13 | Disposition: A | Discharge status: Home / Self Care | Payer: Medicare HMO | Source: Home / Self Care | Attending: Emergency Medicine | Admitting: Emergency Medicine

## 2019-02-13 DIAGNOSIS — R0689 Other abnormalities of breathing: Secondary | ICD-10-CM | POA: Diagnosis not present

## 2019-02-13 DIAGNOSIS — E039 Hypothyroidism, unspecified: Secondary | ICD-10-CM | POA: Insufficient documentation

## 2019-02-13 DIAGNOSIS — Z79890 Hormone replacement therapy (postmenopausal): Secondary | ICD-10-CM | POA: Diagnosis not present

## 2019-02-13 DIAGNOSIS — M79605 Pain in left leg: Secondary | ICD-10-CM

## 2019-02-13 DIAGNOSIS — Z7401 Bed confinement status: Secondary | ICD-10-CM | POA: Diagnosis not present

## 2019-02-13 DIAGNOSIS — R0902 Hypoxemia: Secondary | ICD-10-CM | POA: Diagnosis not present

## 2019-02-13 DIAGNOSIS — I639 Cerebral infarction, unspecified: Secondary | ICD-10-CM | POA: Diagnosis not present

## 2019-02-13 DIAGNOSIS — Z20822 Contact with and (suspected) exposure to covid-19: Secondary | ICD-10-CM | POA: Diagnosis not present

## 2019-02-13 DIAGNOSIS — N19 Unspecified kidney failure: Secondary | ICD-10-CM | POA: Diagnosis present

## 2019-02-13 DIAGNOSIS — G9340 Encephalopathy, unspecified: Secondary | ICD-10-CM | POA: Diagnosis not present

## 2019-02-13 DIAGNOSIS — G936 Cerebral edema: Secondary | ICD-10-CM | POA: Diagnosis not present

## 2019-02-13 DIAGNOSIS — Z833 Family history of diabetes mellitus: Secondary | ICD-10-CM | POA: Diagnosis not present

## 2019-02-13 DIAGNOSIS — M79662 Pain in left lower leg: Secondary | ICD-10-CM | POA: Diagnosis not present

## 2019-02-13 DIAGNOSIS — Z7189 Other specified counseling: Secondary | ICD-10-CM | POA: Diagnosis not present

## 2019-02-13 DIAGNOSIS — Z66 Do not resuscitate: Secondary | ICD-10-CM | POA: Diagnosis not present

## 2019-02-13 DIAGNOSIS — Z7902 Long term (current) use of antithrombotics/antiplatelets: Secondary | ICD-10-CM | POA: Diagnosis not present

## 2019-02-13 DIAGNOSIS — G309 Alzheimer's disease, unspecified: Secondary | ICD-10-CM | POA: Insufficient documentation

## 2019-02-13 DIAGNOSIS — Z789 Other specified health status: Secondary | ICD-10-CM | POA: Diagnosis not present

## 2019-02-13 DIAGNOSIS — R509 Fever, unspecified: Secondary | ICD-10-CM | POA: Diagnosis not present

## 2019-02-13 DIAGNOSIS — R05 Cough: Secondary | ICD-10-CM | POA: Diagnosis present

## 2019-02-13 DIAGNOSIS — I63513 Cerebral infarction due to unspecified occlusion or stenosis of bilateral middle cerebral arteries: Secondary | ICD-10-CM | POA: Diagnosis not present

## 2019-02-13 DIAGNOSIS — Z79899 Other long term (current) drug therapy: Secondary | ICD-10-CM | POA: Insufficient documentation

## 2019-02-13 DIAGNOSIS — Z8673 Personal history of transient ischemic attack (TIA), and cerebral infarction without residual deficits: Secondary | ICD-10-CM | POA: Insufficient documentation

## 2019-02-13 DIAGNOSIS — Z7901 Long term (current) use of anticoagulants: Secondary | ICD-10-CM | POA: Insufficient documentation

## 2019-02-13 DIAGNOSIS — I10 Essential (primary) hypertension: Secondary | ICD-10-CM | POA: Diagnosis present

## 2019-02-13 DIAGNOSIS — R4182 Altered mental status, unspecified: Secondary | ICD-10-CM | POA: Diagnosis not present

## 2019-02-13 DIAGNOSIS — R4020 Unspecified coma: Secondary | ICD-10-CM | POA: Diagnosis not present

## 2019-02-13 DIAGNOSIS — Z85048 Personal history of other malignant neoplasm of rectum, rectosigmoid junction, and anus: Secondary | ICD-10-CM | POA: Diagnosis not present

## 2019-02-13 DIAGNOSIS — G4733 Obstructive sleep apnea (adult) (pediatric): Secondary | ICD-10-CM | POA: Diagnosis not present

## 2019-02-13 DIAGNOSIS — Z515 Encounter for palliative care: Secondary | ICD-10-CM | POA: Diagnosis not present

## 2019-02-13 DIAGNOSIS — E860 Dehydration: Secondary | ICD-10-CM | POA: Diagnosis not present

## 2019-02-13 DIAGNOSIS — G459 Transient cerebral ischemic attack, unspecified: Secondary | ICD-10-CM | POA: Diagnosis not present

## 2019-02-13 DIAGNOSIS — I63522 Cerebral infarction due to unspecified occlusion or stenosis of left anterior cerebral artery: Secondary | ICD-10-CM | POA: Diagnosis not present

## 2019-02-13 DIAGNOSIS — Z03818 Encounter for observation for suspected exposure to other biological agents ruled out: Secondary | ICD-10-CM | POA: Diagnosis not present

## 2019-02-13 DIAGNOSIS — Z888 Allergy status to other drugs, medicaments and biological substances status: Secondary | ICD-10-CM | POA: Diagnosis not present

## 2019-02-13 DIAGNOSIS — F0150 Vascular dementia without behavioral disturbance: Secondary | ICD-10-CM | POA: Diagnosis not present

## 2019-02-13 DIAGNOSIS — R404 Transient alteration of awareness: Secondary | ICD-10-CM | POA: Diagnosis not present

## 2019-02-13 DIAGNOSIS — R29818 Other symptoms and signs involving the nervous system: Secondary | ICD-10-CM | POA: Insufficient documentation

## 2019-02-13 DIAGNOSIS — Z85040 Personal history of malignant carcinoid tumor of rectum: Secondary | ICD-10-CM | POA: Insufficient documentation

## 2019-02-13 DIAGNOSIS — I482 Chronic atrial fibrillation, unspecified: Secondary | ICD-10-CM | POA: Diagnosis not present

## 2019-02-13 DIAGNOSIS — Z880 Allergy status to penicillin: Secondary | ICD-10-CM | POA: Diagnosis not present

## 2019-02-13 DIAGNOSIS — M2550 Pain in unspecified joint: Secondary | ICD-10-CM | POA: Diagnosis not present

## 2019-02-13 DIAGNOSIS — I4891 Unspecified atrial fibrillation: Secondary | ICD-10-CM | POA: Insufficient documentation

## 2019-02-13 DIAGNOSIS — I959 Hypotension, unspecified: Secondary | ICD-10-CM | POA: Diagnosis not present

## 2019-02-13 DIAGNOSIS — I63512 Cerebral infarction due to unspecified occlusion or stenosis of left middle cerebral artery: Secondary | ICD-10-CM | POA: Diagnosis not present

## 2019-02-13 HISTORY — DX: Vascular dementia, unspecified severity, without behavioral disturbance, psychotic disturbance, mood disturbance, and anxiety: F01.50

## 2019-02-13 HISTORY — DX: Procedure and treatment not carried out because of patient's decision for reasons of belief and group pressure: Z53.1

## 2019-02-13 HISTORY — DX: Dementia in other diseases classified elsewhere, unspecified severity, without behavioral disturbance, psychotic disturbance, mood disturbance, and anxiety: F02.80

## 2019-02-13 HISTORY — DX: Reserved for inherently not codable concepts without codable children: IMO0001

## 2019-02-13 LAB — CBC WITH DIFFERENTIAL/PLATELET
Abs Immature Granulocytes: 0.02 10*3/uL (ref 0.00–0.07)
Basophils Absolute: 0 10*3/uL (ref 0.0–0.1)
Basophils Relative: 0 %
Eosinophils Absolute: 0.1 10*3/uL (ref 0.0–0.5)
Eosinophils Relative: 2 %
HCT: 38.9 % (ref 36.0–46.0)
Hemoglobin: 12.2 g/dL (ref 12.0–15.0)
Immature Granulocytes: 0 %
Lymphocytes Relative: 20 %
Lymphs Abs: 1.2 10*3/uL (ref 0.7–4.0)
MCH: 29.5 pg (ref 26.0–34.0)
MCHC: 31.4 g/dL (ref 30.0–36.0)
MCV: 94 fL (ref 80.0–100.0)
Monocytes Absolute: 0.6 10*3/uL (ref 0.1–1.0)
Monocytes Relative: 10 %
Neutro Abs: 4 10*3/uL (ref 1.7–7.7)
Neutrophils Relative %: 68 %
Platelets: 215 10*3/uL (ref 150–400)
RBC: 4.14 MIL/uL (ref 3.87–5.11)
RDW: 14.5 % (ref 11.5–15.5)
WBC: 5.9 10*3/uL (ref 4.0–10.5)
nRBC: 0 % (ref 0.0–0.2)

## 2019-02-13 LAB — COMPREHENSIVE METABOLIC PANEL
ALT: 10 U/L (ref 0–44)
AST: 17 U/L (ref 15–41)
Albumin: 3.6 g/dL (ref 3.5–5.0)
Alkaline Phosphatase: 72 U/L (ref 38–126)
Anion gap: 7 (ref 5–15)
BUN: 9 mg/dL (ref 8–23)
CO2: 29 mmol/L (ref 22–32)
Calcium: 9.1 mg/dL (ref 8.9–10.3)
Chloride: 105 mmol/L (ref 98–111)
Creatinine, Ser: 0.84 mg/dL (ref 0.44–1.00)
GFR calc Af Amer: >60 mL/min (ref >60)
GFR calc non Af Amer: >60 mL/min (ref >60)
Glucose, Bld: 103 mg/dL — ABNORMAL HIGH (ref 70–99)
Potassium: 3.8 mmol/L (ref 3.5–5.1)
Sodium: 141 mmol/L (ref 135–145)
Total Bilirubin: 1 mg/dL (ref 0.3–1.2)
Total Protein: 7 g/dL (ref 6.5–8.1)

## 2019-02-13 LAB — PROTIME-INR
INR: 1.3 — ABNORMAL HIGH (ref 0.8–1.2)
Prothrombin Time: 16.2 seconds — ABNORMAL HIGH (ref 11.4–15.2)

## 2019-02-13 NOTE — Discharge Instructions (Addendum)
Follow-up with your regular doctor.  Please call for an appointment.  Return if worsening.

## 2019-02-13 NOTE — ED Notes (Signed)
Pt states her leg "froze up" and hurt so bad she couldn't walk on so called her daughter. Pt states leg feels better now. No swelling, no heat. Daughter states she felt a "lump" in her calf but that is also gone. Asked if it felt like a charley horse cramp and pt says "I don't know". Pt is on coumadin currently.

## 2019-02-13 NOTE — ED Provider Notes (Signed)
[facility] Emergency Department Provider Note  ____________________________________________   First MD Initiated Contact with Patient 02/13/19 1406     (approximate)  I have reviewed the triage vital signs and the nursing notes.   HISTORY  Chief Complaint Leg Pain    HPI Nicole Walton is a 86 y.o. female presents emergency department with her daughter-in-law.  She states her leg would not function earlier today.  It would move and she could not use it.  She had pain in the back of the calf up to the back of the thigh.  She is concerned that she has a blood clot.  Patient is also had 2 strokes in the past.  She takes Coumadin for A. fib and her previous stroke.  Her INR has been low for the last few weeks.  Followed by Dr. Ginette Pitman at [place] [facility].    Past Medical History:  Diagnosis Date  . Atrial fibrillation ([place])   . Dysrhythmia    A-fib  . Edema    feet/legs  . Hypothyroidism   . Incontinence   . Mixed Alzheimer's and vascular dementia ([place])   . Refusal of blood transfusions as patient is Jehovah's Witness   . Shortness of breath dyspnea    with exersion  . Sleep apnea   . Stroke ([place])   . TIA (transient ischemic attack)   . TIA (transient ischemic attack)     Patient Active Problem List   Diagnosis Date Noted  . Altered mental status 10/19/2018  . Rectal cancer ([place])   . Hx of colonic polyps   . Hematochezia 07/13/2017  . Patient is Jehovah's Witness 07/13/2017  . Atrial fibrillation, chronic ([place]) 07/13/2017  . CVA (cerebral vascular accident) ([place]) 07/13/2017  . Hypothyroidism 07/13/2017  . OSA (obstructive sleep apnea) 07/13/2017  . TIA (transient ischemic attack) 02/25/2016  . UTI (urinary tract infection) 02/25/2016    Past Surgical History:  Procedure Laterality Date  . CATARACT EXTRACTION W/PHACO Right 04/20/2015   Procedure: CATARACT EXTRACTION PHACO AND INTRAOCULAR LENS PLACEMENT (IOC);  Surgeon: Leandrew Koyanagi, MD;  Location: [facility] ORS;  Service: Ophthalmology;  Laterality: Right;  [place]   1:12.1 AP%  16.2 CDE   11.69 fluid cassette lot# [id number]   exp.07/04/2016  . CATARACT EXTRACTION W/PHACO Left 06/01/2015   Procedure: CATARACT EXTRACTION PHACO AND INTRAOCULAR LENS PLACEMENT (IOC);  Surgeon: Leandrew Koyanagi, MD;  Location: [facility] ORS;  Service: Ophthalmology;  Laterality: Left;  [place] 1.17 AP% 9.9 CDE 765 Fluid Pack Lot # [id number]  . CESAREAN SECTION    . CHOLECYSTECTOMY    . COLONOSCOPY    . COLONOSCOPY WITH PROPOFOL N/A 07/14/2017   Procedure: COLONOSCOPY WITH PROPOFOL;  Surgeon: Lin Landsman, MD;  Location: [facility] ENDOSCOPY;  Service: Gastroenterology;  Laterality: N/A;  . COLONOSCOPY WITH PROPOFOL N/A 07/18/2017   Procedure: COLONOSCOPY WITH PROPOFOL;  Surgeon: Lin Landsman, MD;  Location: [facility] ENDOSCOPY;  Service: Gastroenterology;  Laterality: N/A;  . EUS N/A 09/11/2017   Procedure: LOWER ENDOSCOPIC ULTRASOUND (EUS);  Surgeon: Holly Bodily, MD;  Location: [facility] ENDOSCOPY;  Service: Gastroenterology;  Laterality: N/A;  . FLEXIBLE SIGMOIDOSCOPY N/A 03/19/2018   Procedure: FLEXIBLE SIGMOIDOSCOPY;  Surgeon: Lin Landsman, MD;  Location[facility] ENDOSCOPY;  Service: Gastroenterology;  Laterality: N/A;  . FOOT SURGERY      Prior to Admission medications   Medication Sig Start Date End Date Taking? Authorizing Provider  atenolol (TENORMIN) 25 MG tablet Take 1 tablet by mouth daily. 11/04/16  [provider]  Calcium-Magnesium-Vitamin D (CALCIUM 500 PO) Take 1 tablet by mouth daily.    [provider]  Cetirizine HCl (ZYRTEC ALLERGY) 10 MG CAPS Take 1 tablet by mouth daily as needed.     [provider]  clopidogrel (PLAVIX) 75 MG tablet Take 75 mg by mouth daily.  01/09/17   [provider]  docusate sodium (COLACE) 100 MG capsule Take 100 mg by mouth 2 (two) times daily as needed for mild constipation.    [provider]    furosemide (LASIX) 40 MG tablet Take 40-80 mg by mouth daily as needed.  11/17/16   [provider]  HYDROcodone-acetaminophen (NORCO/VICODIN) 5-325 MG tablet Take 1 tablet by mouth every 6 (six) hours as needed. For back pain 03/08/16   [provider]  hydrOXYzine (ATARAX/VISTARIL) 25 MG tablet Take 1 tablet by mouth 3 (three) times daily as needed. 07/31/18   [provider]  ketoconazole (NIZORAL) 2 % cream Apply 1 application topically daily. 11/07/16   [provider]  KRILL OIL PO Take 1 tablet by mouth as needed.    [provider]  levothyroxine (SYNTHROID) 75 MCG tablet Take 75 mcg by mouth daily before breakfast.     [provider]  Menthol-Zinc Oxide (CALMOSEPTINE) 0.44-20.6 % OINT Apply 1 application topically daily. 11/07/16   [provider]  nitroGLYCERIN (NITROSTAT) 0.4 MG SL tablet Place 0.4 mg under the tongue every 5 (five) minutes as needed for chest pain.    [provider]  oxybutynin (DITROPAN) 5 MG tablet Take 5 mg by mouth 2 (two) times daily.     [provider]  triamcinolone cream (KENALOG) 0.1 % Apply 1 application topically 2 (two) times daily.    [provider]  vitamin B-12 (CYANOCOBALAMIN) 500 MCG tablet Take 0.5 tablets (250 mcg total) by mouth daily. 10/21/18   Fritzi Mandes, MD  warfarin (COUMADIN) 4 MG tablet TAKE 1 TABLET (4MG ) DAILY MONDAY THROUGH FRIDAY. (TAKE 5MG  ON SATURDAY AND SUNDAY AS DIRECTED) 04/22/17   [provider]  warfarin (COUMADIN) 5 MG tablet Take 5 mg by mouth as directed. Every Saturday and Sunday evening 05/06/17   [provider]    Allergies Ciprofloxacin, Contrast media [iodinated diagnostic agents], Iodine, Nitrofurantoin, and Penicillin g  Family History  Problem Relation Age of Onset  . Atrial fibrillation Son   . Diabetes Daughter   . Breast cancer Neg Hx     Social History Social History   Tobacco Use  . Smoking status:  Never Smoker  . Smokeless tobacco: Never Used  Substance Use Topics  . Alcohol use: No  . Drug use: No    Review of Systems  Constitutional: No fever/chills Eyes: No visual changes. ENT: No sore throat. Respiratory: Denies cough Cardiovascular: Denies chest pain Gastrointestinal: Denies abdominal pain Genitourinary: Negative for dysuria. Musculoskeletal: Negative for back pain.  Positive for leg pain Skin: Negative for rash. Psychiatric: no mood changes,  Neuro: Questionable strokelike symptoms    ____________________________________________   PHYSICAL EXAM:  VITAL SIGNS: ED Triage Vitals  Enc Vitals Group     BP 02/13/19 1332 (!) 155/65     Pulse Rate 02/13/19 1332 65     Resp 02/13/19 1332 18     Temp 02/13/19 1332 98.6 F (37 C)     Temp Source 02/13/19 1332 Oral     SpO2 02/13/19 1332 96 %     Weight 02/13/19 1333 180 lb (81.6 kg)  Height 02/13/19 1333 5\' 3"  (1.6 m)     Head Circumference --      Peak Flow --      Pain Score 02/13/19 1333 10     Pain Loc --      Pain Edu? --      Excl. in [place]? --     Constitutional: Alert and oriented. Well appearing and in no acute distress. Eyes: Conjunctivae are normal.  Head: Atraumatic. Nose: No congestion/rhinnorhea. Mouth/Throat: Mucous membranes are moist.   Neck:  supple no lymphadenopathy noted Cardiovascular: Normal rate, regular rhythm. Heart sounds are normal Respiratory: Normal respiratory effort.  No retractions, lungs c t a  GU: deferred Musculoskeletal: FROM all extremities, warm and well perfused, calf is tender, patient is able to ambulate without difficulty Neurologic:  Normal speech and language.  Skin:  Skin is warm, dry and intact. No rash noted. Psychiatric: Mood and affect are normal. Speech and behavior are normal.  ____________________________________________   LABS (all labs ordered are listed, but only abnormal results are displayed)  Labs Reviewed  COMPREHENSIVE METABOLIC PANEL -  Abnormal; Notable for the following components:      Result Value   Glucose, Bld 103 (*)    All other components within normal limits  PROTIME-INR - Abnormal; Notable for the following components:   Prothrombin Time 16.2 (*)    INR 1.3 (*)    All other components within normal limits  CBC WITH DIFFERENTIAL/PLATELET   ____________________________________________   ____________________________________________  RADIOLOGY  CT of the head does not show any acute changes Ultrasound left lower extremity is negative for DVT  ____________________________________________   PROCEDURES  Procedure(s) performed: No  Procedures    ____________________________________________   INITIAL IMPRESSION / ASSESSMENT AND PLAN / ED COURSE  Pertinent labs & imaging results that were available during my care of the patient were reviewed by me and considered in my medical decision making (see chart for details).   Patient is an 86 year old female with a history of 2 strokes along with A. fib.  Patient states that her INR has been low for a few weeks.  She states that her leg would not work earlier today.  She could not get it to move.  She also states she has pain in her calf.  Daughter-in-law is concerned that she could have had another stroke and also a questionable blood clot.  Physical exam patient is now able to ambulate without difficulty.  Posterior calf is tender  CBC, metabolic panel, INR, CT the head, ultrasound left calf    ----------------------------------------- 4:49 PM on 02/13/2019 -----------------------------------------  Comprehensive metabolic panel is normal, CBC is normal, INR is 1.3 which is low for the patient's therapeutic level. CT the head does not show any acute changes Ultrasound of the left lower extremity does not show a DVT  Explained all of the findings to the patient and her daughter-in-law.  She is to follow-up with her primary care doctor.  Return emergency  department worsening.  She states she understands will comply.  Patient was discharged in stable condition in the care of her daughter-in-law.  Boni ARMANII FIETZ was evaluated in Emergency Department on 02/13/2019 for the symptoms described in the history of present illness. She was evaluated in the context of the global COVID-19 pandemic, which necessitated consideration that the patient might be at risk for infection with the SARS-CoV-2 virus that causes COVID-19. Institutional protocols and algorithms that pertain to the evaluation of patients at risk for COVID-19  are in a state of rapid change based on information released by regulatory bodies including the CDC and federal and state organizations. These policies and algorithms were followed during the patient's care in the ED.   As part of my medical decision making, I reviewed the following data within the [place] notes reviewed and incorporated, Labs reviewed see above, Old chart reviewed, Radiograph reviewed see above, Notes from prior ED visits and [place] Controlled Substance Database  ____________________________________________   FINAL CLINICAL IMPRESSION(S) / ED DIAGNOSES  Final diagnoses:  Left leg pain      NEW MEDICATIONS STARTED DURING THIS VISIT:  New Prescriptions   No medications on file     Note:  This document was prepared using Dragon voice recognition software and may include unintentional dictation errors.    Versie Starks, PA-C 02/13/19 1651    Lavonia Drafts, MD 02/14/19 1757

## 2019-02-13 NOTE — ED Triage Notes (Signed)
Pt arrived via POV with daughter in law, reports left leg pain that started today. Pt states her leg locked up on her while she was walking down her hall way.   Pt states the pain goes down her whole leg to her foot.

## 2019-02-15 ENCOUNTER — Emergency Department: Payer: Medicare HMO

## 2019-02-15 ENCOUNTER — Inpatient Hospital Stay
Admission: EM | Admit: 2019-02-15 | Discharge: 2019-02-17 | DRG: 064 | Disposition: A | Discharge status: Hospice | Payer: Medicare HMO | Attending: Internal Medicine | Admitting: Internal Medicine

## 2019-02-15 ENCOUNTER — Other Ambulatory Visit: Payer: Self-pay

## 2019-02-15 ENCOUNTER — Encounter: Payer: Self-pay | Admitting: Intensive Care

## 2019-02-15 DIAGNOSIS — F0280 Dementia in other diseases classified elsewhere without behavioral disturbance: Secondary | ICD-10-CM | POA: Diagnosis present

## 2019-02-15 DIAGNOSIS — Z7902 Long term (current) use of antithrombotics/antiplatelets: Secondary | ICD-10-CM | POA: Diagnosis not present

## 2019-02-15 DIAGNOSIS — G309 Alzheimer's disease, unspecified: Secondary | ICD-10-CM | POA: Diagnosis present

## 2019-02-15 DIAGNOSIS — Z20822 Contact with and (suspected) exposure to covid-19: Secondary | ICD-10-CM | POA: Diagnosis present

## 2019-02-15 DIAGNOSIS — Z515 Encounter for palliative care: Secondary | ICD-10-CM

## 2019-02-15 DIAGNOSIS — R05 Cough: Secondary | ICD-10-CM | POA: Diagnosis present

## 2019-02-15 DIAGNOSIS — G936 Cerebral edema: Secondary | ICD-10-CM | POA: Diagnosis present

## 2019-02-15 DIAGNOSIS — IMO0001 Reserved for inherently not codable concepts without codable children: Secondary | ICD-10-CM | POA: Diagnosis present

## 2019-02-15 DIAGNOSIS — I482 Chronic atrial fibrillation, unspecified: Secondary | ICD-10-CM | POA: Diagnosis present

## 2019-02-15 DIAGNOSIS — Z66 Do not resuscitate: Secondary | ICD-10-CM | POA: Diagnosis present

## 2019-02-15 DIAGNOSIS — Z85048 Personal history of other malignant neoplasm of rectum, rectosigmoid junction, and anus: Secondary | ICD-10-CM | POA: Diagnosis not present

## 2019-02-15 DIAGNOSIS — Z833 Family history of diabetes mellitus: Secondary | ICD-10-CM

## 2019-02-15 DIAGNOSIS — I63513 Cerebral infarction due to unspecified occlusion or stenosis of bilateral middle cerebral arteries: Secondary | ICD-10-CM | POA: Diagnosis present

## 2019-02-15 DIAGNOSIS — Z7901 Long term (current) use of anticoagulants: Secondary | ICD-10-CM | POA: Diagnosis not present

## 2019-02-15 DIAGNOSIS — Z79899 Other long term (current) drug therapy: Secondary | ICD-10-CM

## 2019-02-15 DIAGNOSIS — Z880 Allergy status to penicillin: Secondary | ICD-10-CM

## 2019-02-15 DIAGNOSIS — Z8673 Personal history of transient ischemic attack (TIA), and cerebral infarction without residual deficits: Secondary | ICD-10-CM

## 2019-02-15 DIAGNOSIS — G4733 Obstructive sleep apnea (adult) (pediatric): Secondary | ICD-10-CM | POA: Diagnosis present

## 2019-02-15 DIAGNOSIS — I10 Essential (primary) hypertension: Secondary | ICD-10-CM | POA: Diagnosis present

## 2019-02-15 DIAGNOSIS — F0150 Vascular dementia without behavioral disturbance: Secondary | ICD-10-CM | POA: Diagnosis present

## 2019-02-15 DIAGNOSIS — N19 Unspecified kidney failure: Secondary | ICD-10-CM | POA: Diagnosis present

## 2019-02-15 DIAGNOSIS — Z79890 Hormone replacement therapy (postmenopausal): Secondary | ICD-10-CM | POA: Diagnosis not present

## 2019-02-15 DIAGNOSIS — Z789 Other specified health status: Secondary | ICD-10-CM | POA: Diagnosis present

## 2019-02-15 DIAGNOSIS — Z7189 Other specified counseling: Secondary | ICD-10-CM | POA: Diagnosis not present

## 2019-02-15 DIAGNOSIS — E039 Hypothyroidism, unspecified: Secondary | ICD-10-CM | POA: Diagnosis present

## 2019-02-15 DIAGNOSIS — E860 Dehydration: Secondary | ICD-10-CM | POA: Diagnosis present

## 2019-02-15 DIAGNOSIS — I639 Cerebral infarction, unspecified: Secondary | ICD-10-CM | POA: Diagnosis present

## 2019-02-15 DIAGNOSIS — G9340 Encephalopathy, unspecified: Secondary | ICD-10-CM | POA: Diagnosis present

## 2019-02-15 DIAGNOSIS — Z888 Allergy status to other drugs, medicaments and biological substances status: Secondary | ICD-10-CM

## 2019-02-15 LAB — URINALYSIS, COMPLETE (UACMP) WITH MICROSCOPIC
Bacteria, UA: NONE SEEN
Bilirubin Urine: NEGATIVE
Glucose, UA: NEGATIVE mg/dL
Hgb urine dipstick: NEGATIVE
Ketones, ur: 20 mg/dL — AB
Leukocytes,Ua: NEGATIVE
Nitrite: NEGATIVE
Protein, ur: 100 mg/dL — AB
Specific Gravity, Urine: 1.024 (ref 1.005–1.030)
Squamous Epithelial / HPF: NONE SEEN (ref 0–5)
pH: 5 (ref 5.0–8.0)

## 2019-02-15 LAB — BLOOD GAS, VENOUS
Acid-Base Excess: 2.3 mmol/L — ABNORMAL HIGH (ref 0.0–2.0)
Bicarbonate: 26.5 mmol/L (ref 20.0–28.0)
FIO2: 0.21
O2 Saturation: 51 %
Patient temperature: 37
pCO2, Ven: 39 mmHg — ABNORMAL LOW (ref 44.0–60.0)
pH, Ven: 7.44 — ABNORMAL HIGH (ref 7.250–7.430)
pO2, Ven: <31 mmHg — CL (ref 32.0–45.0)

## 2019-02-15 LAB — CBC WITH DIFFERENTIAL/PLATELET
Abs Immature Granulocytes: 0.03 K/uL (ref 0.00–0.07)
Basophils Absolute: 0 K/uL (ref 0.0–0.1)
Basophils Relative: 0 %
Eosinophils Absolute: 0 K/uL (ref 0.0–0.5)
Eosinophils Relative: 0 %
HCT: 43.3 % (ref 36.0–46.0)
Hemoglobin: 14 g/dL (ref 12.0–15.0)
Immature Granulocytes: 0 %
Lymphocytes Relative: 5 %
Lymphs Abs: 0.5 K/uL — ABNORMAL LOW (ref 0.7–4.0)
MCH: 29.7 pg (ref 26.0–34.0)
MCHC: 32.3 g/dL (ref 30.0–36.0)
MCV: 91.9 fL (ref 80.0–100.0)
Monocytes Absolute: 0.4 K/uL (ref 0.1–1.0)
Monocytes Relative: 4 %
Neutro Abs: 9.1 K/uL — ABNORMAL HIGH (ref 1.7–7.7)
Neutrophils Relative %: 91 %
Platelets: 224 K/uL (ref 150–400)
RBC: 4.71 MIL/uL (ref 3.87–5.11)
RDW: 14.2 % (ref 11.5–15.5)
WBC: 10 K/uL (ref 4.0–10.5)
nRBC: 0 % (ref 0.0–0.2)

## 2019-02-15 LAB — RESPIRATORY PANEL BY RT PCR (FLU A&B, COVID)
Influenza A by PCR: NEGATIVE
Influenza B by PCR: NEGATIVE
SARS Coronavirus 2 by RT PCR: NEGATIVE

## 2019-02-15 LAB — COMPREHENSIVE METABOLIC PANEL WITH GFR
ALT: 12 U/L (ref 0–44)
AST: 24 U/L (ref 15–41)
Albumin: 3.7 g/dL (ref 3.5–5.0)
Alkaline Phosphatase: 75 U/L (ref 38–126)
Anion gap: 12 (ref 5–15)
BUN: 15 mg/dL (ref 8–23)
CO2: 23 mmol/L (ref 22–32)
Calcium: 9 mg/dL (ref 8.9–10.3)
Chloride: 102 mmol/L (ref 98–111)
Creatinine, Ser: 0.67 mg/dL (ref 0.44–1.00)
GFR calc Af Amer: >60 mL/min
GFR calc non Af Amer: >60 mL/min
Glucose, Bld: 141 mg/dL — ABNORMAL HIGH (ref 70–99)
Potassium: 4 mmol/L (ref 3.5–5.1)
Sodium: 137 mmol/L (ref 135–145)
Total Bilirubin: 1.6 mg/dL — ABNORMAL HIGH (ref 0.3–1.2)
Total Protein: 7.5 g/dL (ref 6.5–8.1)

## 2019-02-15 LAB — LACTIC ACID, PLASMA
Lactic Acid, Venous: 2 mmol/L (ref 0.5–1.9)
Lactic Acid, Venous: 2.1 mmol/L (ref 0.5–1.9)

## 2019-02-15 LAB — PROTIME-INR
INR: 1.3 — ABNORMAL HIGH (ref 0.8–1.2)
Prothrombin Time: 16 s — ABNORMAL HIGH (ref 11.4–15.2)

## 2019-02-15 MED ORDER — BIOTENE DRY MOUTH MT LIQD
15.0000 mL | OROMUCOSAL | Status: DC | PRN
  Filled 2019-02-15: qty 15

## 2019-02-15 MED ORDER — LORAZEPAM 2 MG/ML PO CONC
1.0000 mg | ORAL | Status: DC | PRN
  Filled 2019-02-15: qty 0.5

## 2019-02-15 MED ORDER — IPRATROPIUM-ALBUTEROL 0.5-2.5 (3) MG/3ML IN SOLN: 3.0000 mL | Freq: Three times a day (TID) | RESPIRATORY_TRACT | Status: DC

## 2019-02-15 MED ORDER — MORPHINE SULFATE (CONCENTRATE) 10 MG/0.5ML PO SOLN
5.0000 mg | ORAL | Status: DC | PRN
  Filled 2019-02-15: qty 1

## 2019-02-15 MED ORDER — LORAZEPAM 2 MG/ML IJ SOLN
1.0000 mg | INTRAMUSCULAR | Status: DC | PRN
  Filled 2019-02-15: qty 1

## 2019-02-15 MED ORDER — HALOPERIDOL LACTATE 2 MG/ML PO CONC
0.5000 mg | ORAL | Status: DC | PRN
  Filled 2019-02-15: qty 0.3

## 2019-02-15 MED ORDER — SENNA 8.6 MG PO TABS: 1.0000 | ORAL_TABLET | Freq: Every evening | ORAL | Status: DC | PRN

## 2019-02-15 MED ORDER — OXYBUTYNIN CHLORIDE 5 MG PO TABS
2.5000 mg | ORAL_TABLET | Freq: Four times a day (QID) | ORAL | Status: DC | PRN
  Filled 2019-02-15: qty 0.5

## 2019-02-15 MED ORDER — MORPHINE SULFATE (CONCENTRATE) 10 MG/0.5ML PO SOLN
5.0000 mg | ORAL | Status: DC | PRN
  Administered 2019-02-16: 13:00:00 5 mg via SUBLINGUAL

## 2019-02-15 MED ORDER — LORAZEPAM 1 MG PO TABS: 1.0000 mg | ORAL_TABLET | ORAL | Status: DC | PRN

## 2019-02-15 MED ORDER — LOPERAMIDE HCL 2 MG PO CAPS
2.0000 mg | ORAL_CAPSULE | ORAL | Status: DC | PRN
  Filled 2019-02-15: qty 1

## 2019-02-15 MED ORDER — IPRATROPIUM-ALBUTEROL 0.5-2.5 (3) MG/3ML IN SOLN
3.0000 mL | Freq: Four times a day (QID) | RESPIRATORY_TRACT | Status: DC
  Administered 2019-02-15: 20:00:00 3 mL via RESPIRATORY_TRACT
  Filled 2019-02-15: qty 3

## 2019-02-15 MED ORDER — FLEET ENEMA 7-19 GM/118ML RE ENEM: 1.0000 | ENEMA | Freq: Every day | RECTAL | Status: DC | PRN

## 2019-02-15 MED ORDER — BIOTENE DRY MOUTH MT LIQD: 15.0000 mL | OROMUCOSAL | Status: DC | PRN

## 2019-02-15 MED ORDER — POLYVINYL ALCOHOL 1.4 % OP SOLN
1.0000 [drp] | Freq: Four times a day (QID) | OPHTHALMIC | Status: DC | PRN
  Filled 2019-02-15: qty 15

## 2019-02-15 MED ORDER — PANTOPRAZOLE SODIUM 40 MG PO TBEC: 40.0000 mg | DELAYED_RELEASE_TABLET | Freq: Every day | ORAL | Status: DC

## 2019-02-15 MED ORDER — BACLOFEN 10 MG PO TABS
5.0000 mg | ORAL_TABLET | Freq: Two times a day (BID) | ORAL | Status: DC | PRN
  Filled 2019-02-15: qty 0.5

## 2019-02-15 MED ORDER — DIPHENHYDRAMINE HCL 50 MG/ML IJ SOLN: 12.5000 mg | INTRAMUSCULAR | Status: DC | PRN

## 2019-02-15 MED ORDER — MAGIC MOUTHWASH
15.0000 mL | Freq: Four times a day (QID) | ORAL | Status: DC | PRN
  Filled 2019-02-15: qty 20

## 2019-02-15 MED ORDER — ACETAMINOPHEN 325 MG PO TABS: 650.0000 mg | ORAL_TABLET | Freq: Four times a day (QID) | ORAL | Status: DC | PRN

## 2019-02-15 MED ORDER — ATROPINE SULFATE 1 % OP SOLN
4.0000 [drp] | OPHTHALMIC | Status: DC | PRN
  Filled 2019-02-15: qty 2

## 2019-02-15 MED ORDER — NYSTATIN 100000 UNIT/GM EX POWD
Freq: Three times a day (TID) | CUTANEOUS | Status: DC | PRN
  Filled 2019-02-15: qty 15

## 2019-02-15 MED ORDER — HALOPERIDOL LACTATE 5 MG/ML IJ SOLN: 0.5000 mg | INTRAMUSCULAR | Status: DC | PRN

## 2019-02-15 MED ORDER — KETOROLAC TROMETHAMINE 30 MG/ML IJ SOLN
15.0000 mg | Freq: Three times a day (TID) | INTRAMUSCULAR | Status: DC
  Administered 2019-02-15: 23:00:00 15 mg via INTRAVENOUS
  Filled 2019-02-15: qty 1

## 2019-02-15 MED ORDER — ONDANSETRON 4 MG PO TBDP
4.0000 mg | ORAL_TABLET | Freq: Four times a day (QID) | ORAL | Status: DC | PRN
  Filled 2019-02-15: qty 1

## 2019-02-15 MED ORDER — LORAZEPAM 2 MG/ML IJ SOLN
1.0000 mg | INTRAMUSCULAR | Status: DC | PRN
  Administered 2019-02-16: 13:00:00 1 mg via INTRAVENOUS

## 2019-02-15 MED ORDER — ONDANSETRON HCL 4 MG/2ML IJ SOLN: 4.0000 mg | Freq: Four times a day (QID) | INTRAMUSCULAR | Status: DC | PRN

## 2019-02-15 MED ORDER — MORPHINE SULFATE (PF) 2 MG/ML IV SOLN: 1.0000 mg | INTRAVENOUS | Status: DC | PRN

## 2019-02-15 MED ORDER — ACETAMINOPHEN 650 MG RE SUPP: 650.0000 mg | Freq: Four times a day (QID) | RECTAL | Status: DC | PRN

## 2019-02-15 MED ORDER — HALOPERIDOL 0.5 MG PO TABS
0.5000 mg | ORAL_TABLET | ORAL | Status: DC | PRN
  Filled 2019-02-15: qty 1

## 2019-02-15 NOTE — Progress Notes (Signed)
Per Dr Reesa Chew, no stroke orders at this time, patient to be made comfort care

## 2019-02-15 NOTE — ED Notes (Signed)
Patient transported to CT 

## 2019-02-15 NOTE — ED Notes (Signed)
ED TO INPATIENT HANDOFF REPORT  ED Nurse Name and Phone #: Willene Hatchet Name/Age/Gender Nicole Walton 86 y.o. female Room/Bed: ED33A/ED33A  Code Status   Code Status: DNR  Home/SNF/Other Skilled nursing facility Patient non-verbal Is this baseline? No   Triage Complete: Triage complete  Chief Complaint Acute CVA (cerebrovascular accident) [facility]) [I63.9]  Triage Note No notes on file   Allergies Allergies  Allergen Reactions  . Ciprofloxacin Other (See Comments)    Not sure of reaction   . Contrast Media [Iodinated Diagnostic Agents] Swelling    Of feet and legs.  (Betadine is OK to use)  . Iodine Other (See Comments)  . Nitrofurantoin Other (See Comments)    GI distress  . Penicillin G Other (See Comments)    Has patient had a PCN reaction causing immediate rash, facial/tongue/throat swelling, SOB or lightheadedness with hypotension: No Has patient had a PCN reaction causing severe rash involving mucus membranes or skin necrosis: No Has patient had a PCN reaction that required hospitalization: No Has patient had a PCN reaction occurring within the last 10 years: No If all of the above answers are "NO", then may proceed with Cephalosporin use.    Level of Care/Admitting Diagnosis ED Disposition    ED Disposition Condition [facility] Area: [place] [[id number]]  Level of Care: Med-Surg [16]  Covid Evaluation: Asymptomatic Screening Protocol (No Symptoms)  Diagnosis: Acute CVA (cerebrovascular accident) [facility]) [id number]  Admitting Physician: Gerlean Ren [facility] [id number]  Attending Physician: Gerlean Ren [facility] [id number]  Estimated length of stay: past midnight tomorrow  Certification:: I certify this patient will need inpatient services for at least 2 midnights       B Medical/Surgery History Past Medical History:  Diagnosis Date  . Atrial fibrillation ([place])   . Dysrhythmia    A-fib  . Edema    feet/legs  .  Hypothyroidism   . Incontinence   . Mixed Alzheimer's and vascular dementia ([place])   . Refusal of blood transfusions as patient is Jehovah's Witness   . Shortness of breath dyspnea    with exersion  . Sleep apnea   . Stroke ([place])   . TIA (transient ischemic attack)   . TIA (transient ischemic attack)    Past Surgical History:  Procedure Laterality Date  . CATARACT EXTRACTION W/PHACO Right 04/20/2015   Procedure: CATARACT EXTRACTION PHACO AND INTRAOCULAR LENS PLACEMENT (IOC);  Surgeon: Leandrew Koyanagi, MD;  Location: [facility] ORS;  Service: Ophthalmology;  Laterality: Right;  [place]   1:12.1 AP%  16.2 CDE   11.69 fluid cassette lot# [id number]   exp.07/04/2016  . CATARACT EXTRACTION W/PHACO Left 06/01/2015   Procedure: CATARACT EXTRACTION PHACO AND INTRAOCULAR LENS PLACEMENT (IOC);  Surgeon: Leandrew Koyanagi, MD;  Location: [facility] ORS;  Service: Ophthalmology;  Laterality: Left;  [place] 1.17 AP% 9.9 CDE 765 Fluid Pack Lot # [id number]  . CESAREAN SECTION    . CHOLECYSTECTOMY    . COLONOSCOPY    . COLONOSCOPY WITH PROPOFOL N/A 07/14/2017   Procedure: COLONOSCOPY WITH PROPOFOL;  Surgeon: Lin Landsman, MD;  Location: [facility] ENDOSCOPY;  Service: Gastroenterology;  Laterality: N/A;  . COLONOSCOPY WITH PROPOFOL N/A 07/18/2017   Procedure: COLONOSCOPY WITH PROPOFOL;  Surgeon: Lin Landsman, MD;  Location: [facility] ENDOSCOPY;  Service: Gastroenterology;  Laterality: N/A;  . EUS N/A 09/11/2017   Procedure: LOWER ENDOSCOPIC ULTRASOUND (EUS);  Surgeon: Holly Bodily, MD;  Location: [facility] ENDOSCOPY;  Service: Gastroenterology;  Laterality: N/A;  .  FLEXIBLE SIGMOIDOSCOPY N/A 03/19/2018   Procedure: FLEXIBLE SIGMOIDOSCOPY;  Surgeon: Lin Landsman, MD;  Location: [facility] ENDOSCOPY;  Service: Gastroenterology;  Laterality: N/A;  . FOOT SURGERY       A IV Location/Drains/Wounds Patient Lines/Drains/Airways Status   Active Line/Drains/Airways    Name:   Placement date:   Placement time:   Site:    Days:   External Urinary Catheter   02/15/19    1129    --   less than 1          Intake/Output Last 24 hours No intake or output data in the 24 hours ending 02/15/19 1550  Labs/Imaging Results for orders placed or performed during the hospital encounter of 02/15/19 (from the past 48 hour(s))  Lactic acid, plasma     Status: Abnormal   Collection Time: 02/15/19 10:59 AM  Result Value Ref Range   Lactic Acid, Venous 2.0 (HH) 0.5 - 1.9 mmol/L    Comment: CRITICAL RESULT CALLED TO, READ BACK BY AND VERIFIED WITH AMBER PAYNE AT 1215 02/15/19 DAS Performed at [facility], [address] [place], [place] [address]   Comprehensive metabolic panel     Status: Abnormal   Collection Time: 02/15/19 10:59 AM  Result Value Ref Range   Sodium 137 135 - 145 mmol/L   Potassium 4.0 3.5 - 5.1 mmol/L   Chloride 102 98 - 111 mmol/L   CO2 23 22 - 32 mmol/L   Glucose, Bld 141 (H) 70 - 99 mg/dL   BUN 15 8 - 23 mg/dL   Creatinine, Ser 0.67 0.44 - 1.00 mg/dL   Calcium 9.0 8.9 - 10.3 mg/dL   Total Protein 7.5 6.5 - 8.1 g/dL   Albumin 3.7 3.5 - 5.0 g/dL   AST 24 15 - 41 U/L   ALT 12 0 - 44 U/L   Alkaline Phosphatase 75 38 - 126 U/L   Total Bilirubin 1.6 (H) 0.3 - 1.2 mg/dL   GFR calc non Af Amer >60 >60 mL/min   GFR calc Af Amer >60 >60 mL/min   Anion gap 12 5 - 15    Comment: Performed at [facility], [address] [place], [place] [address]  CBC WITH DIFFERENTIAL     Status: Abnormal   Collection Time: 02/15/19 10:59 AM  Result Value Ref Range   WBC 10.0 4.0 - 10.5 K/uL   RBC 4.71 3.87 - 5.11 MIL/uL   Hemoglobin 14.0 12.0 - 15.0 g/dL   HCT 43.3 36.0 - 46.0 %   MCV 91.9 80.0 - 100.0 fL   MCH 29.7 26.0 - 34.0 pg   MCHC 32.3 30.0 - 36.0 g/dL   RDW 14.2 11.5 - 15.5 %   Platelets 224 150 - 400 K/uL   nRBC 0.0 0.0 - 0.2 %   Neutrophils Relative % 91 %   Neutro Abs 9.1 (H) 1.7 - 7.7 K/uL   Lymphocytes Relative 5 %   Lymphs Abs 0.5 (L) 0.7 - 4.0 K/uL   Monocytes  Relative 4 %   Monocytes Absolute 0.4 0.1 - 1.0 K/uL   Eosinophils Relative 0 %   Eosinophils Absolute 0.0 0.0 - 0.5 K/uL   Basophils Relative 0 %   Basophils Absolute 0.0 0.0 - 0.1 K/uL   Immature Granulocytes 0 %   Abs Immature Granulocytes 0.03 0.00 - 0.07 K/uL    Comment: Performed at [facility], [address] [place], [place] [address]  Blood gas, venous ([facility], [facility], [facility])  Status: Abnormal   Collection Time: 02/15/19 10:59 AM  Result Value Ref Range   FIO2 0.21    pH, Ven 7.44 (H) 7.250 - 7.430   pCO2, Ven 39 (L) 44.0 - 60.0 mmHg   pO2, Ven <31.0 (LL) 32.0 - 45.0 mmHg    Comment: CRITICAL RESULT CALLED TO, READ BACK BY AND VERIFIED WITH: AMBER RN AT 1139 ON 02/15/19 BL    Bicarbonate 26.5 20.0 - 28.0 mmol/L   Acid-Base Excess 2.3 (H) 0.0 - 2.0 mmol/L   O2 Saturation 51.0 %   Patient temperature 37.0    Collection site VENOUS    Sample type VENOUS     Comment: Performed at [facility], [address] [place], [place] [address]  Urinalysis, Complete w Microscopic     Status: Abnormal   Collection Time: 02/15/19 10:59 AM  Result Value Ref Range   Color, Urine YELLOW (A) YELLOW   APPearance HAZY (A) CLEAR   Specific Gravity, Urine 1.024 1.005 - 1.030   pH 5.0 5.0 - 8.0   Glucose, UA NEGATIVE NEGATIVE mg/dL   Hgb urine dipstick NEGATIVE NEGATIVE   Bilirubin Urine NEGATIVE NEGATIVE   Ketones, ur 20 (A) NEGATIVE mg/dL   Protein, ur 100 (A) NEGATIVE mg/dL   Nitrite NEGATIVE NEGATIVE   Leukocytes,Ua NEGATIVE NEGATIVE   RBC / HPF 0-5 0 - 5 RBC/hpf   WBC, UA 0-5 0 - 5 WBC/hpf   Bacteria, UA NONE SEEN NONE SEEN   Squamous Epithelial / LPF NONE SEEN 0 - 5   Mucus PRESENT    Hyaline Casts, UA PRESENT    Ca Oxalate Crys, UA PRESENT     Comment: Performed at [facility], [address] [place], [place] [address]  Protime-INR     Status: Abnormal   Collection Time: 02/15/19 10:59 AM  Result Value Ref Range   Prothrombin Time 16.0 (H) 11.4 -  15.2 seconds   INR 1.3 (H) 0.8 - 1.2    Comment: (NOTE) INR goal varies based on device and disease states. Performed at [facility], [address] [place], [place] [address]   Respiratory Panel by RT PCR (Flu A&B, Covid) - Nasopharyngeal Swab     Status: None   Collection Time: 02/15/19 10:59 AM   Specimen: Nasopharyngeal Swab  Result Value Ref Range   SARS Coronavirus 2 by RT PCR NEGATIVE NEGATIVE    Comment: (NOTE) SARS-CoV-2 target nucleic acids are NOT DETECTED. The SARS-CoV-2 RNA is generally detectable in upper respiratoy specimens during the acute phase of infection. The lowest concentration of SARS-CoV-2 viral copies this assay can detect is 131 copies/mL. A negative result does not preclude SARS-Cov-2 infection and should not be used as the sole basis for treatment or other patient management decisions. A negative result may occur with  improper specimen collection/handling, submission of specimen other than nasopharyngeal swab, presence of viral mutation(s) within the areas targeted by this assay, and inadequate number of viral copies (<131 copies/mL). A negative result must be combined with clinical observations, patient history, and epidemiological information. The expected result is Negative. Fact Sheet for Patients:  PinkCheek.be Fact Sheet for Healthcare Providers:  GravelBags.it This test is not yet ap proved or cleared by the [place] FDA and  has been authorized for detection and/or diagnosis of SARS-CoV-2 by FDA under an Emergency Use Authorization (EUA). This EUA will remain  in effect (meaning this test can be used) for the duration of the COVID-19 declaration under Section  564(b)(1) of the Act, 21 U.S.C. section 360bbb-3(b)(1), unless the authorization is terminated or revoked sooner.    Influenza A by PCR NEGATIVE NEGATIVE   Influenza B by PCR NEGATIVE NEGATIVE    Comment:  (NOTE) The Xpert Xpress SARS-CoV-2/FLU/RSV assay is intended as an aid in  the diagnosis of influenza from Nasopharyngeal swab specimens and  should not be used as a sole basis for treatment. Nasal washings and  aspirates are unacceptable for Xpert Xpress SARS-CoV-2/FLU/RSV  testing. Fact Sheet for Patients: PinkCheek.be Fact Sheet for Healthcare Providers: GravelBags.it This test is not yet approved or cleared by the [place] FDA and  has been authorized for detection and/or diagnosis of SARS-CoV-2 by  FDA under an Emergency Use Authorization (EUA). This EUA will remain  in effect (meaning this test can be used) for the duration of the  Covid-19 declaration under Section 564(b)(1) of the Act, 21  U.S.C. section 360bbb-3(b)(1), unless the authorization is  terminated or revoked. Performed at [facility], [address], [place], [place] [address]    CT Head Wo Contrast  Result Date: 02/15/2019 CLINICAL DATA:  Encephalopathy. Patient found unresponsive today. Last seen well at 8 p.m. last night. EXAM: CT HEAD WITHOUT CONTRAST TECHNIQUE: Contiguous axial images were obtained from the base of the skull through the vertex without intravenous contrast. COMPARISON:  CT head without contrast 02/13/2019 FINDINGS: Brain: A large nonhemorrhagic infarct encompasses the majority of the left MCA and ACA territory. The left caudate head is preserved. The left lentiform nucleus is infarcted. There is extensive edema with effacement of the sulci over the left hemisphere. There is some preserved cortex inferiorly in the medial left frontal lobe. 1 mm midline shift is present. Is partial effacement of the left lateral ventricle. White matter changes extend into the brainstem. Remote left cerebellar infarct is again noted. No acute hemorrhage is present. No significant extraaxial fluid collection is present. Vascular: Hyperdense distal left  MCA is noted. No other focal hyperdensities are present. Minimal calcifications are present. Skull: Calvarium is intact. No focal lytic or blastic lesions are present. No significant extracranial soft tissue lesion is present. Sinuses/Orbits: A small fluid level is present in the right maxillary sinuses. The paranasal sinuses and mastoid air cells are otherwise clear. The globes and orbits are within normal limits. IMPRESSION: 1. Large nonhemorrhagic infarct involving the majority of the left MCA and ACA territory. 2. Extensive edema with effacement of the sulci over the left hemisphere and partial effacement of the left lateral ventricle. 3. 1 mm midline shift. 4. No acute hemorrhage. 5. Remote left cerebellar infarct. Critical Value/emergent results were called by telephone at the time of interpretation on 02/15/2019 at 11:16 am to [facility] , who verbally acknowledged these results. Electronically Signed   By: San Morelle M.D.   On: 02/15/2019 11:19   US Venous Img Lower Unilateral Left  Result Date: 02/13/2019 CLINICAL DATA:  Calf pain EXAM: LEFT LOWER EXTREMITY VENOUS DOPPLER ULTRASOUND TECHNIQUE: Gray-scale sonography with graded compression, as well as color Doppler and duplex ultrasound were performed to evaluate the lower extremity deep venous systems from the level of the common femoral vein and including the common femoral, femoral, profunda femoral, popliteal and calf veins including the posterior tibial, peroneal and gastrocnemius veins when visible. The superficial great saphenous vein was also interrogated. Spectral Doppler was utilized to evaluate flow at rest and with distal augmentation maneuvers in the common femoral, femoral and popliteal veins. COMPARISON:  April 11, 2018  FINDINGS: Contralateral Common Femoral Vein: Respiratory phasicity is normal and symmetric with the symptomatic side. No evidence of thrombus. Normal compressibility. Common Femoral Vein: No evidence  of thrombus. Normal compressibility, respiratory phasicity and response to augmentation. Saphenofemoral Junction: No evidence of thrombus. Normal compressibility and flow on color Doppler imaging. Profunda Femoral Vein: No evidence of thrombus. Normal compressibility and flow on color Doppler imaging. Femoral Vein: No evidence of thrombus. Normal compressibility, respiratory phasicity and response to augmentation. Popliteal Vein: No evidence of thrombus. Normal compressibility, respiratory phasicity and response to augmentation. Calf Veins: No evidence of thrombus. Normal compressibility and flow on color Doppler imaging. Superficial Great Saphenous Vein: No evidence of thrombus. Normal compressibility. Venous Reflux:  None. Other Findings:  None. IMPRESSION: Negative. Electronically Signed   By: Rolm Baptise M.D.   On: 02/13/2019 16:16   DG Chest Port 1 View  Result Date: 02/15/2019 CLINICAL DATA:  Fever and altered mental status. EXAM: PORTABLE CHEST 1 VIEW COMPARISON:  Chest x-ray dated October 19, 2018. FINDINGS: Unchanged cardiomegaly. Atherosclerotic calcification of the aortic arch. Normal pulmonary vascularity. Small nodular opacities at both lung bases. No pleural effusion or pneumothorax. No acute osseous abnormality. IMPRESSION: Small nodular opacities at both lung bases, suspicious for pneumonia, including atypical infection. Electronically Signed   By: Titus Dubin M.D.   On: 02/15/2019 11:02    Pending Labs Unresulted Labs (From admission, onward)    Start     Ordered   02/15/19 1038  Lactic acid, plasma  STAT Now then every 3 hours,   STAT     02/15/19 1038   02/15/19 1038  Urine culture  ONCE - STAT,   STAT     02/15/19 1038          Vitals/Pain Today's Vitals   02/15/19 1051 02/15/19 1052  BP: (!) 175/89   Pulse: 90   Resp: (!) 23   TempSrc: Oral   SpO2: 94%   Weight:  81.6 kg  Height:  5\' 3"  (1.6 m)    Isolation Precautions No active  isolations  Medications Medications  magic mouthwash (has no administration in time range)  acetaminophen (TYLENOL) tablet 650 mg (has no administration in time range)    Or  acetaminophen (TYLENOL) suppository 650 mg (has no administration in time range)  morphine CONCENTRATE 10 MG/0.5ML oral solution 5 mg (has no administration in time range)    Or  morphine CONCENTRATE 10 MG/0.5ML oral solution 5 mg (has no administration in time range)  morphine 2 MG/ML injection 1 mg (has no administration in time range)  LORazepam (ATIVAN) tablet 1 mg (has no administration in time range)    Or  LORazepam (ATIVAN) 2 MG/ML concentrated solution 1 mg (has no administration in time range)    Or  LORazepam (ATIVAN) injection 1 mg (has no administration in time range)  haloperidol (HALDOL) tablet 0.5 mg (has no administration in time range)    Or  haloperidol (HALDOL) 2 MG/ML solution 0.5 mg (has no administration in time range)    Or  haloperidol lactate (HALDOL) injection 0.5 mg (has no administration in time range)  diphenhydrAMINE (BENADRYL) injection 12.5 mg (has no administration in time range)  senna (SENOKOT) tablet 8.6 mg (has no administration in time range)  sodium phosphate (FLEET) 7-19 GM/118ML enema 1 enema (has no administration in time range)  oxybutynin (DITROPAN) tablet 2.5 mg (has no administration in time range)  loperamide (IMODIUM) capsule 2 mg (has no administration in time range)  ondansetron (  ZOFRAN-ODT) disintegrating tablet 4 mg (has no administration in time range)    Or  ondansetron (ZOFRAN) injection 4 mg (has no administration in time range)  pantoprazole (PROTONIX) EC tablet 40 mg (has no administration in time range)  atropine 1 % ophthalmic solution 4 drop (has no administration in time range)  baclofen (LIORESAL) tablet 5 mg (has no administration in time range)  LORazepam (ATIVAN) injection 1 mg (has no administration in time range)  ketorolac (TORADOL) 30 MG/ML  injection 15 mg (has no administration in time range)  ipratropium-albuterol (DUONEB) 0.5-2.5 (3) MG/3ML nebulizer solution 3 mL (has no administration in time range)  nystatin (MYCOSTATIN/NYSTOP) topical powder (has no administration in time range)  polyvinyl alcohol (LIQUIFILM TEARS) 1.4 % ophthalmic solution 1 drop (has no administration in time range)  antiseptic oral rinse (BIOTENE) solution 15 mL (has no administration in time range)    Mobility non-ambulatory High fall risk   Focused Assessments Neuro Assessment Handoff:  Swallow screen pass? No          Neuro Assessment:   Neuro Checks:      Last Documented NIHSS Modified Score:   Has TPA been given? No If patient is a Neuro Trauma and patient is going to OR before floor call report to [place] nurse: [phone] or [phone]     R Recommendations: See Admitting Provider Note  Report given to:   Additional Notes:

## 2019-02-15 NOTE — H&P (Signed)
History and Physical    Nicole Walton [id number] DOB: 1931/07/08 DOA: 02/15/2019  PCP: Tracie Harrier, MD Patient coming from: Home  Chief Complaint: Unresponsive  HPI: Nicole Walton is a 86 y.o. female with medical history significant of history of CVA, atrial fibrillation, advanced dementia, hypothyroidism who was brought to the hospital for unresponsiveness.  Patient talked to her daughter yesterday evening and felt at her baseline.  This morning her son found her sitting on a recliner unresponsive, next to her she noted she had poorly drank her coffee and there was a hot pot on the stove.  But no known exact time for when this happened.  She was brought to the ER.  Apparently patient had lost her husband about 2 months ago and since then she has significantly declined.  Per daughter-in-law, over the last 2 months her INR has been subtherapeutic.  In the ER she was noted to have a nonhemorrhagic large CVA on the left with midline shift.  She was pretty much unresponsive in the ER.  Case was discussed at first by ER provider and later by me with the healthcare power of attorney at bedside-her daughter-in-law.  She was made comfort care.   Review of Systems: As per HPI otherwise 10 point review of systems negative.  Review of Systems Difficult to obtain from the patient given she is unresponsive  Past Medical History:  Diagnosis Date  . Atrial fibrillation ([place])   . Dysrhythmia    A-fib  . Edema    feet/legs  . Hypothyroidism   . Incontinence   . Mixed Alzheimer's and vascular dementia ([place])   . Refusal of blood transfusions as patient is Jehovah's Witness   . Shortness of breath dyspnea    with exersion  . Sleep apnea   . Stroke ([place])   . TIA (transient ischemic attack)   . TIA (transient ischemic attack)     Past Surgical History:  Procedure Laterality Date  . CATARACT EXTRACTION W/PHACO Right 04/20/2015   Procedure: CATARACT EXTRACTION PHACO AND  INTRAOCULAR LENS PLACEMENT (IOC);  Surgeon: Leandrew Koyanagi, MD;  Location: [facility] ORS;  Service: Ophthalmology;  Laterality: Right;  [place]   1:12.1 AP%  16.2 CDE   11.69 fluid cassette lot# [id number]   exp.07/04/2016  . CATARACT EXTRACTION W/PHACO Left 06/01/2015   Procedure: CATARACT EXTRACTION PHACO AND INTRAOCULAR LENS PLACEMENT (IOC);  Surgeon: Leandrew Koyanagi, MD;  Location: [facility] ORS;  Service: Ophthalmology;  Laterality: Left;  [place] 1.17 AP% 9.9 CDE 765 Fluid Pack Lot # [id number]  . CESAREAN SECTION    . CHOLECYSTECTOMY    . COLONOSCOPY    . COLONOSCOPY WITH PROPOFOL N/A 07/14/2017   Procedure: COLONOSCOPY WITH PROPOFOL;  Surgeon: Lin Landsman, MD;  Location: [facility] ENDOSCOPY;  Service: Gastroenterology;  Laterality: N/A;  . COLONOSCOPY WITH PROPOFOL N/A 07/18/2017   Procedure: COLONOSCOPY WITH PROPOFOL;  Surgeon: Lin Landsman, MD;  Location: [facility] ENDOSCOPY;  Service: Gastroenterology;  Laterality: N/A;  . EUS N/A 09/11/2017   Procedure: LOWER ENDOSCOPIC ULTRASOUND (EUS);  Surgeon: Holly Bodily, MD;  Location: [facility] ENDOSCOPY;  Service: Gastroenterology;  Laterality: N/A;  . FLEXIBLE SIGMOIDOSCOPY N/A 03/19/2018   Procedure: FLEXIBLE SIGMOIDOSCOPY;  Surgeon: Lin Landsman, MD;  Location: [facility] ENDOSCOPY;  Service: Gastroenterology;  Laterality: N/A;  . FOOT SURGERY      SOCIAL HISTORY:  reports that she has never smoked. She has never used smokeless tobacco. She reports that she does not drink alcohol or use drugs.  Allergies  Allergen Reactions  . Ciprofloxacin Other (See Comments)    Not sure of reaction   . Contrast Media [Iodinated Diagnostic Agents] Swelling    Of feet and legs.  (Betadine is OK to use)  . Iodine Other (See Comments)  . Nitrofurantoin Other (See Comments)    GI distress  . Penicillin G Other (See Comments)    Has patient had a PCN reaction causing immediate rash, facial/tongue/throat swelling, SOB or lightheadedness with hypotension:  No Has patient had a PCN reaction causing severe rash involving mucus membranes or skin necrosis: No Has patient had a PCN reaction that required hospitalization: No Has patient had a PCN reaction occurring within the last 10 years: No If all of the above answers are "NO", then may proceed with Cephalosporin use.    FAMILY HISTORY: Family History  Problem Relation Age of Onset  . Atrial fibrillation Son   . Diabetes Daughter   . Breast cancer Neg Hx      Prior to Admission medications   Medication Sig Start Date End Date Taking? Authorizing Provider  atenolol (TENORMIN) 25 MG tablet Take 1 tablet by mouth daily. 11/04/16   [provider]  Calcium-Magnesium-Vitamin D (CALCIUM 500 PO) Take 1 tablet by mouth daily.    [provider]  Cetirizine HCl (ZYRTEC ALLERGY) 10 MG CAPS Take 1 tablet by mouth daily as needed.     [provider]  clopidogrel (PLAVIX) 75 MG tablet Take 75 mg by mouth daily.  01/09/17   [provider]  docusate sodium (COLACE) 100 MG capsule Take 100 mg by mouth 2 (two) times daily as needed for mild constipation.    [provider]  furosemide (LASIX) 40 MG tablet Take 40-80 mg by mouth daily as needed.  11/17/16   [provider]  HYDROcodone-acetaminophen (NORCO/VICODIN) 5-325 MG tablet Take 1 tablet by mouth every 6 (six) hours as needed. For back pain 03/08/16   [provider]  hydrOXYzine (ATARAX/VISTARIL) 25 MG tablet Take 1 tablet by mouth 3 (three) times daily as needed. 07/31/18   [provider]  ketoconazole (NIZORAL) 2 % cream Apply 1 application topically daily. 11/07/16   [provider]  KRILL OIL PO Take 1 tablet by mouth as needed.    [provider]  levothyroxine (SYNTHROID) 75 MCG tablet Take 75 mcg by mouth daily before breakfast.     [provider]  Menthol-Zinc Oxide (CALMOSEPTINE) 0.44-20.6 % OINT Apply 1 application topically daily. 11/07/16    [provider]  nitroGLYCERIN (NITROSTAT) 0.4 MG SL tablet Place 0.4 mg under the tongue every 5 (five) minutes as needed for chest pain.    [provider]  oxybutynin (DITROPAN) 5 MG tablet Take 5 mg by mouth 2 (two) times daily.     [provider]  triamcinolone cream (KENALOG) 0.1 % Apply 1 application topically 2 (two) times daily.    [provider]  vitamin B-12 (CYANOCOBALAMIN) 500 MCG tablet Take 0.5 tablets (250 mcg total) by mouth daily. 10/21/18   Fritzi Mandes, MD  warfarin (COUMADIN) 4 MG tablet TAKE 1 TABLET (4MG ) DAILY MONDAY THROUGH FRIDAY. (TAKE 5MG  ON SATURDAY AND SUNDAY AS DIRECTED) 04/22/17   [provider]  warfarin (COUMADIN) 5 MG tablet Take 5 mg by mouth as directed. Every Saturday and Sunday evening 05/06/17   [provider]    Physical Exam: Vitals:   02/15/19 1051 02/15/19 1052  BP: (!) 175/89   Pulse: 90  Resp: (!) 23   TempSrc: Oral   SpO2: 94%   Weight:  81.6 kg  Height:  5\' 3"  (1.6 m)     Constitutional: Unresponsive Respiratory: Clear to auscultation bilaterally Cardiovascular: Normal sinus rhythm, no rubs Abdomen: Nontender nondistended good bowel sounds Musculoskeletal: No edema noted Skin: No rashes seen Neurologic: Unable to assess.  1 intentional movement of extremities. Psychiatric: Difficult to assess     Labs on Admission: I have personally reviewed following labs and imaging studies  CBC: Recent Labs  Lab 02/13/19 1458 02/15/19 1059  WBC 5.9 10.0  NEUTROABS 4.0 9.1*  HGB 12.2 14.0  HCT 38.9 43.3  MCV 94.0 91.9  PLT 215 [id number]   Basic Metabolic Panel: Recent Labs  Lab 02/13/19 1458 02/15/19 1059  NA 141 137  K 3.8 4.0  CL 105 102  CO2 29 23  GLUCOSE 103* 141*  BUN 9 15  CREATININE 0.84 0.67  CALCIUM 9.1 9.0   GFR: Estimated Creatinine Clearance: 52 mL/min (by C-G formula based on SCr of 0.67 mg/dL). Liver Function Tests: Recent Labs  Lab 02/13/19 1458  02/15/19 1059  AST 17 24  ALT 10 12  ALKPHOS 72 75  BILITOT 1.0 1.6*  PROT 7.0 7.5  ALBUMIN 3.6 3.7   No results for input(s): LIPASE, AMYLASE in the last 168 hours. No results for input(s): AMMONIA in the last 168 hours. Coagulation Profile: Recent Labs  Lab 02/13/19 1458 02/15/19 1059  INR 1.3* 1.3*   Cardiac Enzymes: No results for input(s): CKTOTAL, CKMB, CKMBINDEX, TROPONINI in the last 168 hours. BNP (last 3 results) No results for input(s): PROBNP in the last 8760 hours. HbA1C: No results for input(s): HGBA1C in the last 72 hours. CBG: No results for input(s): GLUCAP in the last 168 hours. Lipid Profile: No results for input(s): CHOL, HDL, LDLCALC, TRIG, CHOLHDL, LDLDIRECT in the last 72 hours. Thyroid Function Tests: No results for input(s): TSH, T4TOTAL, FREET4, T3FREE, THYROIDAB in the last 72 hours. Anemia Panel: No results for input(s): VITAMINB12, FOLATE, FERRITIN, TIBC, IRON, RETICCTPCT in the last 72 hours. Urine analysis:    Component Value Date/Time   COLORURINE YELLOW (A) 02/15/2019 1059   APPEARANCEUR HAZY (A) 02/15/2019 1059   LABSPEC 1.024 02/15/2019 1059   PHURINE 5.0 02/15/2019 1059   GLUCOSEU NEGATIVE 02/15/2019 1059   HGBUR NEGATIVE 02/15/2019 1059   BILIRUBINUR NEGATIVE 02/15/2019 1059   KETONESUR 20 (A) 02/15/2019 1059   PROTEINUR 100 (A) 02/15/2019 1059   NITRITE NEGATIVE 02/15/2019 1059   LEUKOCYTESUR NEGATIVE 02/15/2019 1059   Sepsis Labs: !!!!!!!!!!!!!!!!!!!!!!!!!!!!!!!!!!!!!!!!!!!! @LABRCNTIP (procalcitonin:4,lacticidven:4) ) Recent Results (from the past 240 hour(s))  Respiratory Panel by RT PCR (Flu A&B, Covid) - Nasopharyngeal Swab     Status: None   Collection Time: 02/15/19 10:59 AM   Specimen: Nasopharyngeal Swab  Result Value Ref Range Status   SARS Coronavirus 2 by RT PCR NEGATIVE NEGATIVE Final    Comment: (NOTE) SARS-CoV-2 target nucleic acids are NOT DETECTED. The SARS-CoV-2 RNA is generally detectable in upper  respiratoy specimens during the acute phase of infection. The lowest concentration of SARS-CoV-2 viral copies this assay can detect is 131 copies/mL. A negative result does not preclude SARS-Cov-2 infection and should not be used as the sole basis for treatment or other patient management decisions. A negative result may occur with  improper specimen collection/handling, submission of specimen other than nasopharyngeal swab, presence of viral mutation(s) within the areas targeted by this assay, and inadequate number of viral copies (<131 copies/mL).  A negative result must be combined with clinical observations, patient history, and epidemiological information. The expected result is Negative. Fact Sheet for Patients:  PinkCheek.be Fact Sheet for Healthcare Providers:  GravelBags.it This test is not yet ap proved or cleared by the [place] FDA and  has been authorized for detection and/or diagnosis of SARS-CoV-2 by FDA under an Emergency Use Authorization (EUA). This EUA will remain  in effect (meaning this test can be used) for the duration of the COVID-19 declaration under Section 564(b)(1) of the Act, 21 U.S.C. section 360bbb-3(b)(1), unless the authorization is terminated or revoked sooner.    Influenza A by PCR NEGATIVE NEGATIVE Final   Influenza B by PCR NEGATIVE NEGATIVE Final    Comment: (NOTE) The Xpert Xpress SARS-CoV-2/FLU/RSV assay is intended as an aid in  the diagnosis of influenza from Nasopharyngeal swab specimens and  should not be used as a sole basis for treatment. Nasal washings and  aspirates are unacceptable for Xpert Xpress SARS-CoV-2/FLU/RSV  testing. Fact Sheet for Patients: PinkCheek.be Fact Sheet for Healthcare Providers: GravelBags.it This test is not yet approved or cleared by the [place] FDA and  has been authorized for  detection and/or diagnosis of SARS-CoV-2 by  FDA under an Emergency Use Authorization (EUA). This EUA will remain  in effect (meaning this test can be used) for the duration of the  Covid-19 declaration under Section 564(b)(1) of the Act, 21  U.S.C. section 360bbb-3(b)(1), unless the authorization is  terminated or revoked. Performed at [facility], [address], [place], [place] [address]      Radiological Exams on Admission: CT Head Wo Contrast  Result Date: 02/15/2019 CLINICAL DATA:  Encephalopathy. Patient found unresponsive today. Last seen well at 8 p.m. last night. EXAM: CT HEAD WITHOUT CONTRAST TECHNIQUE: Contiguous axial images were obtained from the base of the skull through the vertex without intravenous contrast. COMPARISON:  CT head without contrast 02/13/2019 FINDINGS: Brain: A large nonhemorrhagic infarct encompasses the majority of the left MCA and ACA territory. The left caudate head is preserved. The left lentiform nucleus is infarcted. There is extensive edema with effacement of the sulci over the left hemisphere. There is some preserved cortex inferiorly in the medial left frontal lobe. 1 mm midline shift is present. Is partial effacement of the left lateral ventricle. White matter changes extend into the brainstem. Remote left cerebellar infarct is again noted. No acute hemorrhage is present. No significant extraaxial fluid collection is present. Vascular: Hyperdense distal left MCA is noted. No other focal hyperdensities are present. Minimal calcifications are present. Skull: Calvarium is intact. No focal lytic or blastic lesions are present. No significant extracranial soft tissue lesion is present. Sinuses/Orbits: A small fluid level is present in the right maxillary sinuses. The paranasal sinuses and mastoid air cells are otherwise clear. The globes and orbits are within normal limits. IMPRESSION: 1. Large nonhemorrhagic infarct involving the majority of the left  MCA and ACA territory. 2. Extensive edema with effacement of the sulci over the left hemisphere and partial effacement of the left lateral ventricle. 3. 1 mm midline shift. 4. No acute hemorrhage. 5. Remote left cerebellar infarct. Critical Value/emergent results were called by telephone at the time of interpretation on 02/15/2019 at 11:16 am to [facility] , who verbally acknowledged these results. Electronically Signed   By: San Morelle M.D.   On: 02/15/2019 11:19   CT Head Wo Contrast  Result Date: 02/13/2019 CLINICAL DATA:  Neuro deficits EXAM: CT HEAD  WITHOUT CONTRAST TECHNIQUE: Contiguous axial images were obtained from the base of the skull through the vertex without intravenous contrast. COMPARISON:  10/19/2018 FINDINGS: Brain: There is atrophy and chronic small vessel disease changes. No acute intracranial abnormality. Specifically, no hemorrhage, hydrocephalus, mass lesion, acute infarction, or significant intracranial injury. Old left cerebellar infarct, stable. Vascular: No hyperdense vessel or unexpected calcification. Skull: No acute calvarial abnormality. Sinuses/Orbits: Visualized paranasal sinuses and mastoids clear. Orbital soft tissues unremarkable. Other: None IMPRESSION: Atrophy, chronic microvascular disease. No acute intracranial abnormality. Electronically Signed   By: Rolm Baptise M.D.   On: 02/13/2019 15:30   US Venous Img Lower Unilateral Left  Result Date: 02/13/2019 CLINICAL DATA:  Calf pain EXAM: LEFT LOWER EXTREMITY VENOUS DOPPLER ULTRASOUND TECHNIQUE: Gray-scale sonography with graded compression, as well as color Doppler and duplex ultrasound were performed to evaluate the lower extremity deep venous systems from the level of the common femoral vein and including the common femoral, femoral, profunda femoral, popliteal and calf veins including the posterior tibial, peroneal and gastrocnemius veins when visible. The superficial great saphenous vein was also  interrogated. Spectral Doppler was utilized to evaluate flow at rest and with distal augmentation maneuvers in the common femoral, femoral and popliteal veins. COMPARISON:  April 11, 2018 FINDINGS: Contralateral Common Femoral Vein: Respiratory phasicity is normal and symmetric with the symptomatic side. No evidence of thrombus. Normal compressibility. Common Femoral Vein: No evidence of thrombus. Normal compressibility, respiratory phasicity and response to augmentation. Saphenofemoral Junction: No evidence of thrombus. Normal compressibility and flow on color Doppler imaging. Profunda Femoral Vein: No evidence of thrombus. Normal compressibility and flow on color Doppler imaging. Femoral Vein: No evidence of thrombus. Normal compressibility, respiratory phasicity and response to augmentation. Popliteal Vein: No evidence of thrombus. Normal compressibility, respiratory phasicity and response to augmentation. Calf Veins: No evidence of thrombus. Normal compressibility and flow on color Doppler imaging. Superficial Great Saphenous Vein: No evidence of thrombus. Normal compressibility. Venous Reflux:  None. Other Findings:  None. IMPRESSION: Negative. Electronically Signed   By: Rolm Baptise M.D.   On: 02/13/2019 16:16   DG Chest Port 1 View  Result Date: 02/15/2019 CLINICAL DATA:  Fever and altered mental status. EXAM: PORTABLE CHEST 1 VIEW COMPARISON:  Chest x-ray dated October 19, 2018. FINDINGS: Unchanged cardiomegaly. Atherosclerotic calcification of the aortic arch. Normal pulmonary vascularity. Small nodular opacities at both lung bases. No pleural effusion or pneumothorax. No acute osseous abnormality. IMPRESSION: Small nodular opacities at both lung bases, suspicious for pneumonia, including atypical infection. Electronically Signed   By: Titus Dubin M.D.   On: 02/15/2019 11:02     All images have been reviewed by me personally.    Assessment/Plan Principal Problem:   Acute CVA  (cerebrovascular accident) [facility]) Active Problems:   Patient is Jehovah's Witness   Atrial fibrillation, chronic (HCC)   Hypothyroidism   OSA (obstructive sleep apnea)   Hospice care patient   1. Acute encephalopathy secondary to large nonhemorrhagic left-sided infarct right MCA and ACA territory with extensive edema and 1 mm midline shift 2.  Chronic atrial fibrillation 3.  Chronic anticoagulation, on Coumadin 4.  Essential hypertension 5.  Hypothyroidism 6.  Advanced dementia  -Given patient's mental status, acute mobility and mortality-family/healthcare power of attorney is transition patient to comfort care.  Patient is unresponsive at the moment.  Slightly elevated blood pressure but overall stable vital signs for now. Comfort care orders placed. Anticipate in-hospital death.  DVT prophylaxis: None Code Status: DNR Family Communication: Family  at bedside Disposition Plan: Transition to comfort care Consults called: None Admission status: Admit inpatient for comfort care.  Anticipate in-hospital death at this point.   Time Spent: 65 minutes.  >50% of the time was devoted to discussing the patients care, assessment, plan and disposition with other care givers along with counseling the patient about the risks and benefits of treatment.    Nautia Lem Arsenio Loader MD [facility]  If 7PM-7AM, please contact night-coverage   02/15/2019, 1:12 PM

## 2019-02-15 NOTE — ED Provider Notes (Signed)
[facility] Emergency Department Provider Note       Time seen: ----------------------------------------- 10:39 AM on 02/15/2019 ----------------------------------------- Level V caveat: History/ROS limited by altered mental status  I have reviewed the triage vital signs and the nursing notes.  HISTORY   Chief Complaint No chief complaint on file.    HPI Nicole Walton is a 86 y.o. female with a history of atrial fibrillation, edema, hypothyroidism, dementia, CVA, TIA who presents to the ED for altered mental status.  Reportedly she has had a CVA in the past, has also had altered mental status from UTIs.  Last known well was yesterday, she was found by her son this morning unresponsive.  Past Medical History:  Diagnosis Date  . Atrial fibrillation ([place])   . Dysrhythmia    A-fib  . Edema    feet/legs  . Hypothyroidism   . Incontinence   . Mixed Alzheimer's and vascular dementia ([place])   . Refusal of blood transfusions as patient is Jehovah's Witness   . Shortness of breath dyspnea    with exersion  . Sleep apnea   . Stroke ([place])   . TIA (transient ischemic attack)   . TIA (transient ischemic attack)     Patient Active Problem List   Diagnosis Date Noted  . Altered mental status 10/19/2018  . Rectal cancer ([place])   . Hx of colonic polyps   . Hematochezia 07/13/2017  . Patient is Jehovah's Witness 07/13/2017  . Atrial fibrillation, chronic ([place]) 07/13/2017  . CVA (cerebral vascular accident) ([place]) 07/13/2017  . Hypothyroidism 07/13/2017  . OSA (obstructive sleep apnea) 07/13/2017  . TIA (transient ischemic attack) 02/25/2016  . UTI (urinary tract infection) 02/25/2016    Past Surgical History:  Procedure Laterality Date  . CATARACT EXTRACTION W/PHACO Right 04/20/2015   Procedure: CATARACT EXTRACTION PHACO AND INTRAOCULAR LENS PLACEMENT (IOC);  Surgeon: Leandrew Koyanagi, MD;  Location: [facility] ORS;  Service: Ophthalmology;  Laterality:  Right;  [place]   1:12.1 AP%  16.2 CDE   11.69 fluid cassette lot# [id number]   exp.07/04/2016  . CATARACT EXTRACTION W/PHACO Left 06/01/2015   Procedure: CATARACT EXTRACTION PHACO AND INTRAOCULAR LENS PLACEMENT (IOC);  Surgeon: Leandrew Koyanagi, MD;  Location: [facility] ORS;  Service: Ophthalmology;  Laterality: Left;  [place] 1.17 AP% 9.9 CDE 765 Fluid Pack Lot # [id number]  . CESAREAN SECTION    . CHOLECYSTECTOMY    . COLONOSCOPY    . COLONOSCOPY WITH PROPOFOL N/A 07/14/2017   Procedure: COLONOSCOPY WITH PROPOFOL;  Surgeon: Lin Landsman, MD;  Location: [facility] ENDOSCOPY;  Service: Gastroenterology;  Laterality: N/A;  . COLONOSCOPY WITH PROPOFOL N/A 07/18/2017   Procedure: COLONOSCOPY WITH PROPOFOL;  Surgeon: Lin Landsman, MD;  Location: [facility] ENDOSCOPY;  Service: Gastroenterology;  Laterality: N/A;  . EUS N/A 09/11/2017   Procedure: LOWER ENDOSCOPIC ULTRASOUND (EUS);  Surgeon: Holly Bodily, MD;  Location: [facility] ENDOSCOPY;  Service: Gastroenterology;  Laterality: N/A;  . FLEXIBLE SIGMOIDOSCOPY N/A 03/19/2018   Procedure: FLEXIBLE SIGMOIDOSCOPY;  Surgeon: Lin Landsman, MD;  Location: [facility] ENDOSCOPY;  Service: Gastroenterology;  Laterality: N/A;  . FOOT SURGERY      Allergies Ciprofloxacin, Contrast media [iodinated diagnostic agents], Iodine, Nitrofurantoin, and Penicillin g  Social History Social History   Tobacco Use  . Smoking status: Never Smoker  . Smokeless tobacco: Never Used  Substance Use Topics  . Alcohol use: No  . Drug use: No    Review of Systems Unknown, reported altered mental status  All systems negative/normal/unremarkable  except as stated in the HPI  ____________________________________________   PHYSICAL EXAM:  VITAL SIGNS: ED Triage Vitals  Enc Vitals Group     BP      Pulse      Resp      Temp      Temp src      SpO2      Weight      Height      Head Circumference      Peak Flow      Pain Score      Pain Loc      Pain Edu?       Excl. in [place]?     Constitutional: Patient is not alert, no obvious distress  Eyes: Conjunctivae are normal. Normal extraocular movements. ENT      Head: Normocephalic and atraumatic.      Nose: No congestion/rhinnorhea.      Mouth/Throat: Mucous membranes are dry      Neck: No stridor. Cardiovascular: Normal rate, regular rhythm. No murmurs, rubs, or gallops. Respiratory: Normal respiratory effort without tachypnea nor retractions. Breath sounds are clear and equal bilaterally. No wheezes/rales/rhonchi. Gastrointestinal: Soft and nontender. Normal bowel sounds Musculoskeletal: Limited but unremarkable range of motion of the extremities Neurologic: Patient is not alert, localizes to pain.  GCS is 7 on arrival Skin:  Skin is warm, dry and intact. No rash noted. Psychiatric: Cannot cooperate with examination ____________________________________________  EKG: Interpreted by me.  Atrial fibrillation with a rate of 83 bpm, repolarization abnormality, septal infarct, long QT  ____________________________________________  ED COURSE:  As part of my medical decision making, I reviewed the following data within the [place] History obtained from family if available, nursing notes, old chart and ekg, as well as notes from prior ED visits. Patient presented for altered mental status, we will assess with labs and imaging as indicated at this time.   Procedures  Nicole Walton was evaluated in Emergency Department on 02/15/2019 for the symptoms described in the history of present illness. She was evaluated in the context of the global COVID-19 pandemic, which necessitated consideration that the patient might be at risk for infection with the SARS-CoV-2 virus that causes COVID-19. Institutional protocols and algorithms that pertain to the evaluation of patients at risk for COVID-19 are in a state of rapid change based on information released by regulatory bodies including the CDC  and federal and state organizations. These policies and algorithms were followed during the patient's care in the ED.  ____________________________________________   LABS (pertinent positives/negatives)  Labs Reviewed  CBC WITH DIFFERENTIAL/PLATELET - Abnormal; Notable for the following components:      Result Value   Neutro Abs 9.1 (*)    Lymphs Abs 0.5 (*)    All other components within normal limits  BLOOD GAS, VENOUS - Abnormal; Notable for the following components:   pH, Ven 7.44 (*)    pCO2, Ven 39 (*)    pO2, Ven <31.0 (*)    Acid-Base Excess 2.3 (*)    All other components within normal limits  URINALYSIS, COMPLETE (UACMP) WITH MICROSCOPIC - Abnormal; Notable for the following components:   Color, Urine YELLOW (*)    APPearance HAZY (*)    Ketones, ur 20 (*)    Protein, ur 100 (*)    All other components within normal limits  URINE CULTURE  RESPIRATORY PANEL BY RT PCR (FLU A&B, COVID)  LACTIC ACID, PLASMA  LACTIC ACID, PLASMA  COMPREHENSIVE METABOLIC  PANEL  PROTIME-INR   CRITICAL CARE Performed by: Laurence Aly   Total critical care time: 30 minutes  Critical care time was exclusive of separately billable procedures and treating other patients.  Critical care was necessary to treat or prevent imminent or life-threatening deterioration.  Critical care was time spent personally by me on the following activities: development of treatment plan with patient and/or surrogate as well as nursing, discussions with consultants, evaluation of patient's response to treatment, examination of patient, obtaining history from patient or surrogate, ordering and performing treatments and interventions, ordering and review of laboratory studies, ordering and review of radiographic studies, pulse oximetry and re-evaluation of patient's condition.  RADIOLOGY Images were viewed by me  CT head, chest x-ray IMPRESSION: Small nodular opacities at both lung bases, suspicious  for pneumonia, including atypical infection. CT head  IMPRESSION:  1. Large nonhemorrhagic infarct involving the majority of the left  MCA and ACA territory.  2. Extensive edema with effacement of the sulci over the left  hemisphere and partial effacement of the left lateral ventricle.  3. 1 mm midline shift.  4. No acute hemorrhage.  5. Remote left cerebellar infarct.  ____________________________________________   DIFFERENTIAL DIAGNOSIS   CVA, TIA, sepsis, dehydration, electrolyte abnormality, MI, renal failure  FINAL ASSESSMENT AND PLAN  Altered mental status, CVA, possible pneumonia   Plan: The patient had presented for altered mental status. Patient's labs so far have been unremarkable. Patient's imaging revealed nodular opacities in the lung bases suspicious for pneumonia.  More worrisome was her large nonhemorrhagic infarct in the left MCA and ACA territory.  There was extensive edema with 1 mm midline shift.  Discussing of this with the family they have agreed to make her DNR and I will consult palliative care.  They request admission for comfort measures.   Laurence Aly, MD    Note: This note was generated in part or whole with voice recognition software. Voice recognition is usually quite accurate but there are transcription errors that can and very often do occur. I apologize for any typographical errors that were not detected and corrected.     Earleen Newport, MD 02/15/19 1158

## 2019-02-16 DIAGNOSIS — Z66 Do not resuscitate: Secondary | ICD-10-CM

## 2019-02-16 DIAGNOSIS — I482 Chronic atrial fibrillation, unspecified: Secondary | ICD-10-CM

## 2019-02-16 DIAGNOSIS — Z789 Other specified health status: Secondary | ICD-10-CM

## 2019-02-16 DIAGNOSIS — G4733 Obstructive sleep apnea (adult) (pediatric): Secondary | ICD-10-CM

## 2019-02-16 DIAGNOSIS — Z7189 Other specified counseling: Secondary | ICD-10-CM

## 2019-02-16 DIAGNOSIS — Z515 Encounter for palliative care: Secondary | ICD-10-CM

## 2019-02-16 LAB — URINE CULTURE: Culture: NO GROWTH

## 2019-02-16 MED ORDER — GLYCOPYRROLATE 0.2 MG/ML IJ SOLN: 0.3000 mg | INTRAMUSCULAR | 0 refills | Status: DC

## 2019-02-16 MED ORDER — GLYCOPYRROLATE 0.2 MG/ML IJ SOLN
0.3000 mg | INTRAMUSCULAR | Status: DC
  Administered 2019-02-16 – 2019-02-17 (×7): 0.3 mg via INTRAVENOUS
  Filled 2019-02-16 (×9): qty 1.5

## 2019-02-16 MED ORDER — ATROPINE SULFATE 1 % OP SOLN: 4.0000 [drp] | OPHTHALMIC | 0 refills | Status: AC | PRN

## 2019-02-16 MED ORDER — LORAZEPAM 2 MG/ML PO CONC: 1.0000 mg | ORAL | 0 refills | Status: DC | PRN

## 2019-02-16 MED ORDER — MORPHINE SULFATE (CONCENTRATE) 10 MG/0.5ML PO SOLN
5.0000 mg | ORAL | Status: DC | PRN
  Administered 2019-02-16: 10 mg via SUBLINGUAL
  Administered 2019-02-17 (×2): 5 mg via SUBLINGUAL
  Filled 2019-02-16: qty 1

## 2019-02-16 MED ORDER — MORPHINE SULFATE (CONCENTRATE) 10 MG/0.5ML PO SOLN
5.0000 mg | ORAL | Status: DC | PRN
  Filled 2019-02-16 (×2): qty 1

## 2019-02-16 MED ORDER — MORPHINE SULFATE (CONCENTRATE) 10 MG/0.5ML PO SOLN: 5.0000 mg | ORAL | 0 refills | Status: DC | PRN

## 2019-02-16 NOTE — TOC Initial Note (Signed)
Transition of Care [facility]) - Initial/Assessment Note    Patient Details  Name: Nicole Walton MRN: [id number] Date of Birth: March 12, 1931  Transition of Care [facility]) CM/SW Contact:    Candie Chroman, LCSW Phone Number: 02/16/2019, 12:29 PM  Clinical Narrative: Patient asleep. Son and daughter-in-law at bedside. CSW introduced role and explained that discharge planning would be discussed. Plan is for home with hospice services. Patient's other son and daughter-in-law are going to come assist with care. Preference for [facility]. Patient's husband worked with them in November. Made referral to Flo Shanks, RN. Patient will need EMS home. Address on facesheet is correct. No further concerns. CSW encouraged patient's family to contact CSW as needed. CSW will continue to follow patient and her family for support and facilitate return home when stable.                 Expected Discharge Plan: Home w Hospice Care Barriers to Discharge: Other (comment)(Can discharge once everything set up at home for hospice services.)   Patient Goals and CMS Choice Patient states their goals for this hospitalization and ongoing recovery are:: Patient not awake.      Expected Discharge Plan and Services Expected Discharge Plan: [place] Acute Care Choice: Hospice Living arrangements for the past 2 months: Single Family Home                                      Prior Living Arrangements/Services Living arrangements for the past 2 months: Single Family Home   Patient language and need for interpreter reviewed:: Yes Do you feel safe going back to the place where you live?: Yes      Need for Family Participation in Patient Care: Yes (Comment) Care giver support system in place?: Yes (comment)   Criminal Activity/Legal Involvement Pertinent to Current Situation/Hospitalization: No - Comment as needed  Activities of Daily Living Home Assistive Devices/Equipment: Walker  (specify type), Cane (specify quad or straight), Other (Comment)(family states ' a lot of equipment ") ADL Screening (condition at time of admission) Patient's cognitive ability adequate to safely complete daily activities?: No Is the patient deaf or have difficulty hearing?: Yes Does the patient have difficulty seeing, even when wearing glasses/contacts?: Yes Does the patient have difficulty concentrating, remembering, or making decisions?: Yes Patient able to express need for assistance with ADLs?: No Does the patient have difficulty dressing or bathing?: Yes Independently performs ADLs?: No Communication: Dependent Is this a change from baseline?: Change from baseline, expected to last >3 days Dressing (OT): Dependent Is this a change from baseline?: Change from baseline, expected to last >3 days Grooming: Dependent Is this a change from baseline?: Change from baseline, expected to last >3 days Feeding: Dependent Is this a change from baseline?: Change from baseline, expected to last >3 days Bathing: Dependent Is this a change from baseline?: Change from baseline, expected to last >3 days Toileting: Dependent Is this a change from baseline?: Change from baseline, expected to last >3days In/Out Bed: Dependent Is this a change from baseline?: Change from baseline, expected to last >3 days Walks in Home: Dependent Is this a change from baseline?: Change from baseline, expected to last >3 days Does the patient have difficulty walking or climbing stairs?: Yes Weakness of Legs: Both Weakness of Arms/Hands: Both  Permission Sought/Granted Permission sought to share information with : Customer service manager,  Family Supports    Share Information with NAME: Dellis Filbert and [place] granted to share info w AGENCY: [facility]  Permission granted to share info w Relationship: Son and daughter-in-law  Permission granted to share info w Contact Information:  [phone]  Emotional Assessment Appearance:: Appears stated age Attitude/Demeanor/Rapport: Unable to Assess Affect (typically observed): Unable to Assess Orientation: : (Responds to pain.) Alcohol / Substance Use: Not Applicable Psych Involvement: No (comment)  Admission diagnosis:  Acute CVA (cerebrovascular accident) ([facility] [facility]) [I63.9] Cerebrovascular accident (CVA), unspecified mechanism ([place]) [I63.9] Patient Active Problem List   Diagnosis Date Noted  . Acute CVA (cerebrovascular accident) ([place]) 02/15/2019  . Hospice care patient 02/15/2019  . Altered mental status 10/19/2018  . Rectal cancer ([place])   . Hx of colonic polyps   . Hematochezia 07/13/2017  . Patient is Jehovah's Witness 07/13/2017  . Atrial fibrillation, chronic ([place]) 07/13/2017  . CVA (cerebral vascular accident) ([place]) 07/13/2017  . Hypothyroidism 07/13/2017  . OSA (obstructive sleep apnea) 07/13/2017  . TIA (transient ischemic attack) 02/25/2016  . UTI (urinary tract infection) 02/25/2016   PCP:  Tracie Harrier, MD Pharmacy:   [facility] [address], [place] - [place] [address] [place] [address] Phone: [phone] Fax: [place] [address]), [place] - [place] ([place]) [place] [address] Phone: [phone] Fax: [phone]     Social Determinants of Health (SDOH) Interventions    Readmission Risk Interventions No flowsheet data found.

## 2019-02-16 NOTE — Consult Note (Signed)
Consultation Note Date: 02/16/2019   Patient Name: Nicole Walton  DOB: 05-10-1931  MRN: [id number]  Age / Sex: 86 y.o., female   PCP: Tracie Harrier, MD Referring Physician: Damita Lack, MD   REASON FOR CONSULTATION:Establishing goals of care  Palliative Care consult requested for goals of care in this 86 y.o. female with multiple medical problems including CVA, atrial fibrillation, advanced dementia, hypothyroidism, and sleep apnea. Nicole Walton presented to ED with complaints of unresponsiveness. Per notations son found patient sitting in recliner unresponsive and a hot pot was left on the stove. On work-up patient was noted to have a nonhemorrhagic large CVA on the left with midline shift. Decision was made by patient's daughter-in-law Nicole - [facility]) for no aggressive work-ups or interventions with a goal of comfort care.   Clinical Assessment and Goals of Care: I have reviewed medical records including lab results, imaging, Epic notes, and MAR, received report from the bedside RN, and assessed the patient. I met at the bedside with patient's son, Nicole Walton and his wife Nicole Walton to discuss diagnosis prognosis, [place], EOL wishes, disposition and options. Patient is unresponsive. Congested cough at times observed with some audible secretions and moaning. RN at the bedside to administer PRN medications for comfort.   Family is familiar with Palliative and hospice care, sharing previous experience 2 months ago with patient's husband who passed away in the home. I re-introduced Palliative Medicine as specialized medical care for people living with serious illness. It focuses on providing relief from the symptoms and stress of a serious illness. The goal is to improve quality of life for both the patient and the family. Family verbalized understanding and appreciation.   Daughter-in-law shares patient's health has declined over the years however, has not been the same since the  passing of her husband in November. Family shares their goal is to hopefully get her home with hospice support and allow her to pass away amongst family. Therapeutic listening and support provided.   Per family's request patient has been transitioned to full comfort care.   Son and his wife is patient's HCPOA. They confirm DNR/DNI status.   I educated family on hospice outpatient. They verbalized understanding and sharing their previous experience with their father. Family is requesting [facility] for outpatient hospice.   We discussed home needs and son is requesting a electric hospital bed sharing his wife is going to be the sole caregiver and is unable to use manual bed due to her recent back surgery. Patient will need EMS transport home.   Questions and concerns were addressed.  The family was encouraged to call with questions or concerns.  PMT will continue to support holistically.   SOCIAL HISTORY:     reports that she has never smoked. She has never used smokeless tobacco. She reports that she does not drink alcohol or use drugs.  CODE STATUS: DNR  ADVANCE DIRECTIVES: Nicole Walton and Nicole Walton [facility])   SYMPTOM MANAGEMENT: see below   Palliative Prophylaxis:   Aspiration, Eye Care, Frequent Pain Assessment, Oral Care and Turn Reposition  PSYCHO-SOCIAL/SPIRITUAL:  Support System: Family   Desire for further Chaplaincy support:NO   Additional Recommendations (Limitations, Scope, Preferences):  Full Comfort Care   PAST MEDICAL HISTORY: Past Medical History:  Diagnosis Date  . Atrial fibrillation ([place])   . Dysrhythmia    A-fib  . Edema    feet/legs  . Hypothyroidism   . Incontinence   . Mixed Alzheimer's and vascular dementia ([place])   .  Refusal of blood transfusions as patient is Jehovah's Witness   . Shortness of breath dyspnea    with exersion  . Sleep apnea   . Stroke ([place])   . TIA (transient ischemic attack)   . TIA (transient ischemic attack)     PAST  SURGICAL HISTORY:  Past Surgical History:  Procedure Laterality Date  . CATARACT EXTRACTION W/PHACO Right 04/20/2015   Procedure: CATARACT EXTRACTION PHACO AND INTRAOCULAR LENS PLACEMENT (IOC);  Surgeon: Leandrew Koyanagi, MD;  Location: [facility] ORS;  Service: Ophthalmology;  Laterality: Right;  [place]   1:12.1 AP%  16.2 CDE   11.69 fluid cassette lot# [id number]   exp.07/04/2016  . CATARACT EXTRACTION W/PHACO Left 06/01/2015   Procedure: CATARACT EXTRACTION PHACO AND INTRAOCULAR LENS PLACEMENT (IOC);  Surgeon: Leandrew Koyanagi, MD;  Location: [facility] ORS;  Service: Ophthalmology;  Laterality: Left;  [place] 1.17 AP% 9.9 CDE 765 Fluid Pack Lot # [id number]  . CESAREAN SECTION    . CHOLECYSTECTOMY    . COLONOSCOPY    . COLONOSCOPY WITH PROPOFOL N/A 07/14/2017   Procedure: COLONOSCOPY WITH PROPOFOL;  Surgeon: Lin Landsman, MD;  Location: [facility] ENDOSCOPY;  Service: Gastroenterology;  Laterality: N/A;  . COLONOSCOPY WITH PROPOFOL N/A 07/18/2017   Procedure: COLONOSCOPY WITH PROPOFOL;  Surgeon: Lin Landsman, MD;  Location[facility] ENDOSCOPY;  Service: Gastroenterology;  Laterality: N/A;  . EUS N/A 09/11/2017   Procedure: LOWER ENDOSCOPIC ULTRASOUND (EUS);  Surgeon: Holly Bodily, MD;  Location: [facility] ENDOSCOPY;  Service: Gastroenterology;  Laterality: N/A;  . FLEXIBLE SIGMOIDOSCOPY N/A 03/19/2018   Procedure: FLEXIBLE SIGMOIDOSCOPY;  Surgeon: Lin Landsman, MD;  Location: [facility] ENDOSCOPY;  Service: Gastroenterology;  Laterality: N/A;  . FOOT SURGERY      ALLERGIES:  is allergic to ciprofloxacin; contrast media [iodinated diagnostic agents]; iodine; nitrofurantoin; and penicillin g.   MEDICATIONS:  Current Facility-Administered Medications  Medication Dose Route Frequency Provider Last Rate Last Admin  . atropine 1 % ophthalmic solution 4 drop  4 drop Sublingual Q4H PRN Amin, Ankit Chirag, MD      . glycopyrrolate (ROBINUL) injection 0.3 mg  0.3 mg Intravenous Q4H Pickenpack-Cousar,  Gerome Kokesh N, NP      . haloperidol lactate (HALDOL) injection 0.5 mg  0.5 mg Intravenous Q4H PRN Amin, Ankit Chirag, MD      . LORazepam (ATIVAN) 2 MG/ML concentrated solution 1 mg  1 mg Sublingual Q4H PRN Amin, Jeanella Flattery, MD       Or  . LORazepam (ATIVAN) injection 1 mg  1 mg Intravenous Q4H PRN Amin, Ankit Chirag, MD      . LORazepam (ATIVAN) injection 1 mg  1 mg Intravenous Q4H PRN Amin, Ankit Chirag, MD   1 mg at 02/16/19 1246  . morphine CONCENTRATE 10 MG/0.5ML oral solution 5-10 mg  5-10 mg Oral Q2H PRN Pickenpack-Cousar, Zulema [place] N, NP       Or  . morphine CONCENTRATE 10 MG/0.5ML oral solution 5-10 mg  5-10 mg Sublingual Q2H PRN Pickenpack-Cousar, Loistine Eberlin N, NP      . ondansetron (ZOFRAN-ODT) disintegrating tablet 4 mg  4 mg Oral Q6H PRN Amin, Ankit Chirag, MD       Or  . ondansetron (ZOFRAN) injection 4 mg  4 mg Intravenous Q6H PRN Amin, Ankit Chirag, MD      . polyvinyl alcohol (LIQUIFILM TEARS) 1.4 % ophthalmic solution 1 drop  1 drop Both Eyes QID PRN Amin, Ankit Chirag, MD        VITAL SIGNS: BP (!) 158/94 (BP Location:  Left Arm)   Pulse 77   Temp 99.2 F (37.3 C) (Axillary)   Resp 18   Ht '5\' 3"'  (1.6 m)   Wt 81.6 kg   SpO2 95%   BMI 31.89 kg/m  Filed Weights   02/15/19 1052 02/15/19 1800  Weight: 81.6 kg 81.6 kg    Estimated body mass index is 31.89 kg/m as calculated from the following:   Height as of this encounter: '5\' 3"'  (1.6 m).   Weight as of this encounter: 81.6 kg.  LABS: CBC:    Component Value Date/Time   WBC 10.0 02/15/2019 1059   HGB 14.0 02/15/2019 1059   HCT 43.3 02/15/2019 1059   PLT 224 02/15/2019 1059   Comprehensive Metabolic Panel:    Component Value Date/Time   NA 137 02/15/2019 1059   K 4.0 02/15/2019 1059   CO2 23 02/15/2019 1059   BUN 15 02/15/2019 1059   CREATININE 0.67 02/15/2019 1059   ALBUMIN 3.7 02/15/2019 1059     Review of Systems  Unable to perform ROS: Patient unresponsive    Physical Exam General: some moaning,  frail ill- appearing Cardiovascular: regular rate and rhythm Pulmonary: audible secretions, bilateral rhonchi Abdomen: soft, nontender, hypo bowel sounds Extremities: no edema, no joint deformities Neurological: unresponsive, comfort care   Prognosis: < 2 weeks in the setting of large nonhemorrhagic left-sided infarct, right MCA and ACA with extensive edema, midline shift, a-fib, hypertension, advanced dementia, full comfort care measures.  Discharge Planning:  Home with Hospice  Recommendations:  DNR/DNI-as confirmed by family  Comfort Care measures for EOL  Family requesting patient d/c home with outpatient hospice ([facility]) for EOL support and care. Will need hospital bed, suction, and EMS transport.   Santiago Glad, RN [facility]) aware and at the bedside to discuss with family.   Ativan PRN as ordered (Will need home RX)  Morphine PRN 5-10 mg SL PRN for pain/air hunger (Will need home RX)  Robinul PRN for secretions  Atropine PRN for secretions (will need home RX)  PMT will continue to support and follow as needed.    Palliative Performance Scale: Full Comfort               Family expressed understanding and was in agreement with this plan.   Thank you for allowing the Palliative Medicine Team to assist in the care of this patient.  Time In: 1215 Time Out: 1300 Time Total: 45 min.   Visit consisted of counseling and education dealing with the complex and emotionally intense issues of symptom management and palliative care in the setting of serious and potentially life-threatening illness.Greater than 50%  of this time was spent counseling and coordinating care related to the above assessment and plan.  Signed by:  Alda Lea, AGPCNP-BC Palliative Medicine Team  Phone: [phone] Fax: [phone] Pager: [phone] Amion: Bjorn Pippin

## 2019-02-16 NOTE — Discharge Summary (Addendum)
Physician Discharge Summary  Nicole Walton [id number] DOB: [birth year] DOA: 02/15/2019  PCP: Tracie Harrier, MD  Admit date: 02/15/2019 Discharge date: 02/17/2019  Admitted From: Home  Disposition:  Hospice  Recommendations for Outpatient Follow-up:  1. Follow up with PCP in 1-2 weeks 2. Please obtain BMP/CBC in one week your next doctors visit.     Discharge Condition: Stable CODE STATUS: DNR Diet recommendation: Comfort feeding  Brief/Interim Summary:  86 y.o. female with medical history significant of history of CVA, atrial fibrillation, advanced dementia, hypothyroidism who was brought to the hospital for unresponsiveness.  Patient talked to her daughter yesterday evening and felt at her baseline.  This morning her son found her sitting on a recliner unresponsive, next to her she noted she had poorly drank her coffee and there was a hot pot on the stove.  But no known exact time for when this happened.  She was brought to the ER. Apparently patient had lost her husband about 2 months ago and since then she has significantly declined. Per daughter-in-law, over the last 2 months her INR has been subtherapeutic.  In the ER she was noted to have a nonhemorrhagic large CVA on the left with midline shift.  She was pretty much unresponsive in the ER.  Case was discussed at first by ER provider and later by me with the healthcare power of attorney at bedside-her daughter-in-law.  She was made comfort care.  Hospice team was consulted..  Patient was transitioned to hospice.  1. Acute encephalopathy secondary to large nonhemorrhagic left-sided infarct right MCA and ACA territory with extensive edema and 1 mm midline shift 2.  Chronic atrial fibrillation 3.  Chronic anticoagulation, on Coumadin 4.  Essential hypertension 5.  Hypothyroidism 6.  Advanced dementia  -Poor prognosis given acute morbidity and mortality.  Patient remains unresponsive.  Transition patient to  hospice.   Discharge Diagnoses:  Principal Problem:   Acute CVA (cerebrovascular accident) [facility]) Active Problems:   Patient is Jehovah's Witness   Atrial fibrillation, chronic (HCC)   Hypothyroidism   OSA (obstructive sleep apnea)   Hospice care patient    Consultations:  Hospice  Subjective: Remains unresponsive.  Daughter-in-law at bedside.  All the questions answered.  Discharge Exam: Vitals:   02/17/19 0552 02/17/19 0833  BP: (!) 183/118 (!) 156/90  Pulse: (!) 108 95  Resp: 14 17  Temp: 98.1 F (36.7 C) 98 F (36.7 C)  SpO2: (!) 86% (!) 88%   Vitals:   02/16/19 0758 02/17/19 0020 02/17/19 0552 02/17/19 0833  BP: (!) 158/94 (!) 170/84 (!) 183/118 (!) 156/90  Pulse: 77 94 (!) 108 95  Resp: 18 14 14 17   Temp: 99.2 F (37.3 C) 98.8 F (37.1 C) 98.1 F (36.7 C) 98 F (36.7 C)  TempSrc: Axillary Oral Oral Oral  SpO2: 95% (!) 89% (!) 86% (!) 88%  Weight:      Height:        General: Remains unresponsive Cardiovascular: RRR, S1/S2 +, no rubs, no gallops Respiratory: Clear to auscultation bilaterally Abdominal: Soft, NT, ND, bowel sounds + Extremities: no edema, no cyanosis  Discharge Instructions   Allergies as of 02/17/2019      Reactions   Ciprofloxacin Other (See Comments)   Not sure of reaction    Contrast Media [iodinated Diagnostic Agents] Swelling   Of feet and legs.  (Betadine is OK to use)   Iodine Other (See Comments)   Nitrofurantoin Other (See Comments)   GI distress  Penicillin G Other (See Comments)   Has patient had a PCN reaction causing immediate rash, facial/tongue/throat swelling, SOB or lightheadedness with hypotension: No Has patient had a PCN reaction causing severe rash involving mucus membranes or skin necrosis: No Has patient had a PCN reaction that required hospitalization: No Has patient had a PCN reaction occurring within the last 10 years: No If all of the above answers are "NO", then may proceed with Cephalosporin use.       Medication List    STOP taking these medications   atenolol 25 MG tablet Commonly known as: TENORMIN   CALCIUM 500 PO   Calmoseptine 0.44-20.6 % Oint Generic drug: Menthol-Zinc Oxide   clopidogrel 75 MG tablet Commonly known as: PLAVIX   docusate sodium 100 MG capsule Commonly known as: COLACE   furosemide 40 MG tablet Commonly known as: LASIX   HYDROcodone-acetaminophen 5-325 MG tablet Commonly known as: NORCO/VICODIN   hydrOXYzine 25 MG tablet Commonly known as: ATARAX/VISTARIL   ketoconazole 2 % cream Commonly known as: NIZORAL   KRILL OIL PO   levothyroxine 75 MCG tablet Commonly known as: SYNTHROID   nitroGLYCERIN 0.4 MG SL tablet Commonly known as: NITROSTAT   oxybutynin 5 MG tablet Commonly known as: DITROPAN   triamcinolone cream 0.1 % Commonly known as: KENALOG   vitamin B-12 500 MCG tablet Commonly known as: CYANOCOBALAMIN   warfarin 4 MG tablet Commonly known as: COUMADIN   warfarin 5 MG tablet Commonly known as: COUMADIN   ZyrTEC Allergy 10 MG Caps Generic drug: Cetirizine HCl     TAKE these medications   atropine 1 % ophthalmic solution Place 4 drops under the tongue every 4 (four) hours as needed (excessive secretions).   glycopyrrolate 1 MG tablet Commonly known as: Robinul Take 1 tablet (1 mg total) by mouth 3 (three) times daily.   LORazepam 0.5 MG tablet Commonly known as: Ativan Take 1 tablet (0.5 mg total) by mouth every 4 (four) hours as needed for up to 7 days for anxiety.   morphine CONCENTRATE 10 MG/0.5ML Soln concentrated solution Place 0.25-0.5 mLs (5-10 mg total) under the tongue every 2 (two) hours as needed for up to 5 days for moderate pain (or dyspnea).       Allergies  Allergen Reactions  . Ciprofloxacin Other (See Comments)    Not sure of reaction   . Contrast Media [Iodinated Diagnostic Agents] Swelling    Of feet and legs.  (Betadine is OK to use)  . Iodine Other (See Comments)  .  Nitrofurantoin Other (See Comments)    GI distress  . Penicillin G Other (See Comments)    Has patient had a PCN reaction causing immediate rash, facial/tongue/throat swelling, SOB or lightheadedness with hypotension: No Has patient had a PCN reaction causing severe rash involving mucus membranes or skin necrosis: No Has patient had a PCN reaction that required hospitalization: No Has patient had a PCN reaction occurring within the last 10 years: No If all of the above answers are "NO", then may proceed with Cephalosporin use.    You were cared for by a hospitalist during your hospital stay. If you have any questions about your discharge medications or the care you received while you were in the hospital after you are discharged, you can call the unit and asked to speak with the hospitalist on call if the hospitalist that took care of you is not available. Once you are discharged, your primary care physician will handle any further medical  issues. Please note that no refills for any discharge medications will be authorized once you are discharged, as it is imperative that you return to your primary care physician (or establish a relationship with a primary care physician if you do not have one) for your aftercare needs so that they can reassess your need for medications and monitor your lab values.   Procedures/Studies: CT Head Wo Contrast  Result Date: 02/15/2019 CLINICAL DATA:  Encephalopathy. Patient found unresponsive today. Last seen well at 8 p.m. last night. EXAM: CT HEAD WITHOUT CONTRAST TECHNIQUE: Contiguous axial images were obtained from the base of the skull through the vertex without intravenous contrast. COMPARISON:  CT head without contrast 02/13/2019 FINDINGS: Brain: A large nonhemorrhagic infarct encompasses the majority of the left MCA and ACA territory. The left caudate head is preserved. The left lentiform nucleus is infarcted. There is extensive edema with effacement of the sulci  over the left hemisphere. There is some preserved cortex inferiorly in the medial left frontal lobe. 1 mm midline shift is present. Is partial effacement of the left lateral ventricle. White matter changes extend into the brainstem. Remote left cerebellar infarct is again noted. No acute hemorrhage is present. No significant extraaxial fluid collection is present. Vascular: Hyperdense distal left MCA is noted. No other focal hyperdensities are present. Minimal calcifications are present. Skull: Calvarium is intact. No focal lytic or blastic lesions are present. No significant extracranial soft tissue lesion is present. Sinuses/Orbits: A small fluid level is present in the right maxillary sinuses. The paranasal sinuses and mastoid air cells are otherwise clear. The globes and orbits are within normal limits. IMPRESSION: 1. Large nonhemorrhagic infarct involving the majority of the left MCA and ACA territory. 2. Extensive edema with effacement of the sulci over the left hemisphere and partial effacement of the left lateral ventricle. 3. 1 mm midline shift. 4. No acute hemorrhage. 5. Remote left cerebellar infarct. Critical Value/emergent results were called by telephone at the time of interpretation on 02/15/2019 at 11:16 am to [facility] , who verbally acknowledged these results. Electronically Signed   By: San Morelle M.D.   On: 02/15/2019 11:19   CT Head Wo Contrast  Result Date: 02/13/2019 CLINICAL DATA:  Neuro deficits EXAM: CT HEAD WITHOUT CONTRAST TECHNIQUE: Contiguous axial images were obtained from the base of the skull through the vertex without intravenous contrast. COMPARISON:  10/19/2018 FINDINGS: Brain: There is atrophy and chronic small vessel disease changes. No acute intracranial abnormality. Specifically, no hemorrhage, hydrocephalus, mass lesion, acute infarction, or significant intracranial injury. Old left cerebellar infarct, stable. Vascular: No hyperdense vessel or  unexpected calcification. Skull: No acute calvarial abnormality. Sinuses/Orbits: Visualized paranasal sinuses and mastoids clear. Orbital soft tissues unremarkable. Other: None IMPRESSION: Atrophy, chronic microvascular disease. No acute intracranial abnormality. Electronically Signed   By: Rolm Baptise M.D.   On: 02/13/2019 15:30   US Venous Img Lower Unilateral Left  Result Date: 02/13/2019 CLINICAL DATA:  Calf pain EXAM: LEFT LOWER EXTREMITY VENOUS DOPPLER ULTRASOUND TECHNIQUE: Gray-scale sonography with graded compression, as well as color Doppler and duplex ultrasound were performed to evaluate the lower extremity deep venous systems from the level of the common femoral vein and including the common femoral, femoral, profunda femoral, popliteal and calf veins including the posterior tibial, peroneal and gastrocnemius veins when visible. The superficial great saphenous vein was also interrogated. Spectral Doppler was utilized to evaluate flow at rest and with distal augmentation maneuvers in the common femoral, femoral and popliteal veins. COMPARISON:  April 11, 2018 FINDINGS: Contralateral Common Femoral Vein: Respiratory phasicity is normal and symmetric with the symptomatic side. No evidence of thrombus. Normal compressibility. Common Femoral Vein: No evidence of thrombus. Normal compressibility, respiratory phasicity and response to augmentation. Saphenofemoral Junction: No evidence of thrombus. Normal compressibility and flow on color Doppler imaging. Profunda Femoral Vein: No evidence of thrombus. Normal compressibility and flow on color Doppler imaging. Femoral Vein: No evidence of thrombus. Normal compressibility, respiratory phasicity and response to augmentation. Popliteal Vein: No evidence of thrombus. Normal compressibility, respiratory phasicity and response to augmentation. Calf Veins: No evidence of thrombus. Normal compressibility and flow on color Doppler imaging. Superficial Great Saphenous  Vein: No evidence of thrombus. Normal compressibility. Venous Reflux:  None. Other Findings:  None. IMPRESSION: Negative. Electronically Signed   By: Rolm Baptise M.D.   On: 02/13/2019 16:16   DG Chest Port 1 View  Result Date: 02/15/2019 CLINICAL DATA:  Fever and altered mental status. EXAM: PORTABLE CHEST 1 VIEW COMPARISON:  Chest x-ray dated October 19, 2018. FINDINGS: Unchanged cardiomegaly. Atherosclerotic calcification of the aortic arch. Normal pulmonary vascularity. Small nodular opacities at both lung bases. No pleural effusion or pneumothorax. No acute osseous abnormality. IMPRESSION: Small nodular opacities at both lung bases, suspicious for pneumonia, including atypical infection. Electronically Signed   By: Titus Dubin M.D.   On: 02/15/2019 11:02     The results of significant diagnostics from this hospitalization (including imaging, microbiology, ancillary and laboratory) are listed below for reference.     Microbiology: Recent Results (from the past 240 hour(s))  Urine culture     Status: None   Collection Time: 02/15/19 10:59 AM   Specimen: In/Out Cath Urine  Result Value Ref Range Status   Specimen Description   Final    IN/OUT CATH URINE Performed at [facility], [address], [place], [place] [address]    Special Requests   Final    NONE Performed at [facility], [address], [place], [place] [address]    Culture   Final    NO GROWTH Performed at [facility], [address] [place], [place] [address]    Report Status 02/16/2019 FINAL  Final  Respiratory Panel by RT PCR (Flu A&B, Covid) - Nasopharyngeal Swab     Status: None   Collection Time: 02/15/19 10:59 AM   Specimen: Nasopharyngeal Swab  Result Value Ref Range Status   SARS Coronavirus 2 by RT PCR NEGATIVE NEGATIVE Final    Comment: (NOTE) SARS-CoV-2 target nucleic acids are NOT DETECTED. The SARS-CoV-2 RNA is generally detectable in upper respiratoy specimens  during the acute phase of infection. The lowest concentration of SARS-CoV-2 viral copies this assay can detect is 131 copies/mL. A negative result does not preclude SARS-Cov-2 infection and should not be used as the sole basis for treatment or other patient management decisions. A negative result may occur with  improper specimen collection/handling, submission of specimen other than nasopharyngeal swab, presence of viral mutation(s) within the areas targeted by this assay, and inadequate number of viral copies (<131 copies/mL). A negative result must be combined with clinical observations, patient history, and epidemiological information. The expected result is Negative. Fact Sheet for Patients:  PinkCheek.be Fact Sheet for Healthcare Providers:  GravelBags.it This test is not yet ap proved or cleared by the [place] FDA and  has been authorized for detection and/or diagnosis of SARS-CoV-2 by FDA under an Emergency Use Authorization (EUA). This EUA will remain  in  effect (meaning this test can be used) for the duration of the COVID-19 declaration under Section 564(b)(1) of the Act, 21 U.S.C. section 360bbb-3(b)(1), unless the authorization is terminated or revoked sooner.    Influenza A by PCR NEGATIVE NEGATIVE Final   Influenza B by PCR NEGATIVE NEGATIVE Final    Comment: (NOTE) The Xpert Xpress SARS-CoV-2/FLU/RSV assay is intended as an aid in  the diagnosis of influenza from Nasopharyngeal swab specimens and  should not be used as a sole basis for treatment. Nasal washings and  aspirates are unacceptable for Xpert Xpress SARS-CoV-2/FLU/RSV  testing. Fact Sheet for Patients: PinkCheek.be Fact Sheet for Healthcare Providers: GravelBags.it This test is not yet approved or cleared by the [place] FDA and  has been authorized for detection and/or diagnosis of  SARS-CoV-2 by  FDA under an Emergency Use Authorization (EUA). This EUA will remain  in effect (meaning this test can be used) for the duration of the  Covid-19 declaration under Section 564(b)(1) of the Act, 21  U.S.C. section 360bbb-3(b)(1), unless the authorization is  terminated or revoked. Performed at [facility], [address] [place], [place] [address]      Labs: BNP (last 3 results) No results for input(s): BNP in the last 8760 hours. Basic Metabolic Panel: Recent Labs  Lab 02/13/19 1458 02/15/19 1059  NA 141 137  K 3.8 4.0  CL 105 102  CO2 29 23  GLUCOSE 103* 141*  BUN 9 15  CREATININE 0.84 0.67  CALCIUM 9.1 9.0   Liver Function Tests: Recent Labs  Lab 02/13/19 1458 02/15/19 1059  AST 17 24  ALT 10 12  ALKPHOS 72 75  BILITOT 1.0 1.6*  PROT 7.0 7.5  ALBUMIN 3.6 3.7   No results for input(s): LIPASE, AMYLASE in the last 168 hours. No results for input(s): AMMONIA in the last 168 hours. CBC: Recent Labs  Lab 02/13/19 1458 02/15/19 1059  WBC 5.9 10.0  NEUTROABS 4.0 9.1*  HGB 12.2 14.0  HCT 38.9 43.3  MCV 94.0 91.9  PLT 215 224   Cardiac Enzymes: No results for input(s): CKTOTAL, CKMB, CKMBINDEX, TROPONINI in the last 168 hours. BNP: Invalid input(s): POCBNP CBG: No results for input(s): GLUCAP in the last 168 hours. D-Dimer No results for input(s): DDIMER in the last 72 hours. Hgb A1c No results for input(s): HGBA1C in the last 72 hours. Lipid Profile No results for input(s): CHOL, HDL, LDLCALC, TRIG, CHOLHDL, LDLDIRECT in the last 72 hours. Thyroid function studies No results for input(s): TSH, T4TOTAL, T3FREE, THYROIDAB in the last 72 hours.  Invalid input(s): FREET3 Anemia work up No results for input(s): VITAMINB12, FOLATE, FERRITIN, TIBC, IRON, RETICCTPCT in the last 72 hours. Urinalysis    Component Value Date/Time   COLORURINE YELLOW (A) 02/15/2019 1059   APPEARANCEUR HAZY (A) 02/15/2019 1059   LABSPEC 1.024  02/15/2019 1059   PHURINE 5.0 02/15/2019 1059   GLUCOSEU NEGATIVE 02/15/2019 1059   HGBUR NEGATIVE 02/15/2019 1059   BILIRUBINUR NEGATIVE 02/15/2019 1059   KETONESUR 20 (A) 02/15/2019 1059   PROTEINUR 100 (A) 02/15/2019 1059   NITRITE NEGATIVE 02/15/2019 1059   LEUKOCYTESUR NEGATIVE 02/15/2019 1059   Sepsis Labs Invalid input(s): PROCALCITONIN,  WBC,  LACTICIDVEN Microbiology Recent Results (from the past 240 hour(s))  Urine culture     Status: None   Collection Time: 02/15/19 10:59 AM   Specimen: In/Out Cath Urine  Result Value Ref Range Status   Specimen Description   Final    IN/OUT  CATH URINE Performed at [facility], [address], [place], [place] [address]    Special Requests   Final    NONE Performed at [facility], [address], [place], [place] [address]    Culture   Final    NO GROWTH Performed at [facility], [place] [address], [place],  [address]    Report Status 02/16/2019 FINAL  Final  Respiratory Panel by RT PCR (Flu A&B, Covid) - Nasopharyngeal Swab     Status: None   Collection Time: 02/15/19 10:59 AM   Specimen: Nasopharyngeal Swab  Result Value Ref Range Status   SARS Coronavirus 2 by RT PCR NEGATIVE NEGATIVE Final    Comment: (NOTE) SARS-CoV-2 target nucleic acids are NOT DETECTED. The SARS-CoV-2 RNA is generally detectable in upper respiratoy specimens during the acute phase of infection. The lowest concentration of SARS-CoV-2 viral copies this assay can detect is 131 copies/mL. A negative result does not preclude SARS-Cov-2 infection and should not be used as the sole basis for treatment or other patient management decisions. A negative result may occur with  improper specimen collection/handling, submission of specimen other than nasopharyngeal swab, presence of viral mutation(s) within the areas targeted by this assay, and inadequate number of viral copies (<131 copies/mL). A negative result must be combined  with clinical observations, patient history, and epidemiological information. The expected result is Negative. Fact Sheet for Patients:  PinkCheek.be Fact Sheet for Healthcare Providers:  GravelBags.it This test is not yet ap proved or cleared by the [place] FDA and  has been authorized for detection and/or diagnosis of SARS-CoV-2 by FDA under an Emergency Use Authorization (EUA). This EUA will remain  in effect (meaning this test can be used) for the duration of the COVID-19 declaration under Section 564(b)(1) of the Act, 21 U.S.C. section 360bbb-3(b)(1), unless the authorization is terminated or revoked sooner.    Influenza A by PCR NEGATIVE NEGATIVE Final   Influenza B by PCR NEGATIVE NEGATIVE Final    Comment: (NOTE) The Xpert Xpress SARS-CoV-2/FLU/RSV assay is intended as an aid in  the diagnosis of influenza from Nasopharyngeal swab specimens and  should not be used as a sole basis for treatment. Nasal washings and  aspirates are unacceptable for Xpert Xpress SARS-CoV-2/FLU/RSV  testing. Fact Sheet for Patients: PinkCheek.be Fact Sheet for Healthcare Providers: GravelBags.it This test is not yet approved or cleared by the [place] FDA and  has been authorized for detection and/or diagnosis of SARS-CoV-2 by  FDA under an Emergency Use Authorization (EUA). This EUA will remain  in effect (meaning this test can be used) for the duration of the  Covid-19 declaration under Section 564(b)(1) of the Act, 21  U.S.C. section 360bbb-3(b)(1), unless the authorization is  terminated or revoked. Performed at [facility], [address] [place],  [address]      Time coordinating discharge:  I have spent 35 minutes face to face with the patient and on the ward discussing the patients care, assessment, plan and disposition with other care  givers. >50% of the time was devoted counseling the patient about the risks and benefits of treatment/Discharge disposition and coordinating care.   SIGNED:   Damita Lack, MD  [facility] 02/17/2019, 10:21 AM   If 7PM-7AM, please contact night-coverage

## 2019-02-16 NOTE — Progress Notes (Signed)
New referral for [facility] hospice services at home received from [place]. Writer spoke in the room with patient's son [name] and daughter in law [name] and patient's  Room to initiate education regarding hospice services, philosophy and team approach to care with understanding voiced. Family has requested a fully electric hospital bed. DME has been ordered for delivery today, family is requesting discharge home in the morning. Patient will require EMS transport with signed out of facility DNR in place. CSW Judson Roch boswell updated. She will also require prescriptions for morphine sulfate liquid 10 mg/0.5 ml, dose 5-10 mg q 2 hrs PRN dyspnea/pain dispense 30 ml Lorazepam 0.5 mg tab q 4 hrs PRN Atropine 1% opthalmic drops 4 drops Sublingual q 4 hrs PRN increased ORAL secretions. At discharge. Patient information faxed to referral. Will continue to follow through discharge. Flo Shanks BSN, RN, [place] [phone]

## 2019-02-16 NOTE — Progress Notes (Signed)
Comfort measures dtr in law at bedside. Son in earlier. meds given earlier see mar.

## 2019-02-17 MED ORDER — LORAZEPAM 0.5 MG PO TABS: 0.5000 mg | ORAL_TABLET | ORAL | 0 refills | Status: AC | PRN

## 2019-02-17 MED ORDER — GLYCOPYRROLATE 1 MG PO TABS: 1.0000 mg | ORAL_TABLET | Freq: Three times a day (TID) | ORAL | 0 refills | Status: AC

## 2019-02-17 MED ORDER — MORPHINE SULFATE (CONCENTRATE) 10 MG/0.5ML PO SOLN: 5.0000 mg | ORAL | 0 refills | Status: AC | PRN

## 2019-02-17 NOTE — Progress Notes (Signed)
Follow up visit made to new referral for [facility] hospice services at home. Patient remains unresponsive. She did receive a PRN dose of liquid morphine overnight. She continues with scheduled glycopyrrolate for management of secretions.  Her daughter in Insurance account manager at bedside and confirms that the hospital bed was delivered last evening and plan continues for discharge home today. Hospital care team updated and aware. Comfort medications prescriptions are in place, signed out of facility DNR is also in place. Staff RN has call EMS for transport. Discharge summary faxed to referral. Will continue to follow through discharge. Flo Shanks BSN, RN, [place] [phone]

## 2019-02-17 NOTE — TOC Transition Note (Signed)
Transition of Care [facility]) - CM/SW Discharge Note   Patient Details  Name: Nicole Walton MRN: [id number] Date of Birth: 10-22-1931  Transition of Care [facility]) CM/SW Contact:  Candie Chroman, LCSW Phone Number: 02/17/2019, 10:38 AM   Clinical Narrative: Patient has orders to discharge home with hospice through [facility] today. [facility] liaison is aware. Transport paperwork is complete and in her discharge packet. DNR is signed and in the packet as well. No further concerns. CSW signing off.    Final next level of care: Home w Hospice Care Barriers to Discharge: Barriers Resolved   Patient Goals and CMS Choice Patient states their goals for this hospitalization and ongoing recovery are:: Patient not awake.      Discharge Placement                Patient to be transferred to facility by: EMS Name of family member notified: [name] Patient and family notified of of transfer: 02/17/19  Discharge Plan and Services     Post Acute Care Choice: Hospice                               Social Determinants of Health (SDOH) Interventions     Readmission Risk Interventions No flowsheet data found.

## 2019-02-17 NOTE — Progress Notes (Signed)
EMS called this morning, pt is 2nd/3rd on list awaiting transport  D: Pt responsive to pain.   A: Pt's daughter in law received discharge and medication education/information. Pt belongings were gathered and taken with daughter in law Mikita Guenette upon discharge.   R: Pt's daughter in law Adileni Brooker verbalized understanding of discharge and medication education/information.  Pt is being transported via EMS to home.

## 2019-02-17 NOTE — Progress Notes (Signed)
   02/17/19 1100  Clinical Encounter Type  Visited With Patient not available;Health care provider  Visit Type Initial  Referral From Other (Comment) (Hospice Liason)   Referral to support the patient's family received from Flo Shanks, Hospice Liaison. The patient is going home with Hospice once arrangements made at the home. The patient was receiving care from the Nurse Tech at this time. Will attempt to follow up at a later time.

## 2019-02-17 NOTE — Progress Notes (Signed)
EMS has arrived and it transporting home

## 2019-02-17 NOTE — Progress Notes (Addendum)
Patient seen and examined at bedside.  Son and daughter-in-law are at bedside.  No acute events overnight, patient still remains unresponsive.  Vital signs are overall stable.  Patient does not have any intentional movement, does not follow any commands.  Does not appear to be any distress at the moment.  Family tells me hospital bed was delivered at home yesterday, otherwise everything else is being situated.  They are having patient's daughter flying in this afternoon who is going to be a primary helper at home with the patient.  Discharge paperwork done yesterday.  Comfort medications and pain medication prescribed.  Please call with further questions as needed.  She can be discharged today once all the arrangements have been made at home for patient's comfort.  Time spent 15 mins Gerlean Ren MD [facility]

## 2019-03-08 DEATH — deceased

## 2019-09-22 IMAGING — MR MR HEAD W/O CM
11 series · 48 of 48 positions shown · non-contrast
Comparison: Head CT earlier today.  Brain MRI 02/25/2016.

CLINICAL DATA: 86-year-old female with altered mental status,
stroke-like symptoms.

EXAM:
MRI HEAD WITHOUT CONTRAST
TECHNIQUE: Multiplanar, multiecho pulse sequences of the brain and surrounding
structures were obtained without intravenous contrast.

[Series 2: ax dwi_tracew · axial · 3.0mm · 1.31mm/px · z∈[-106,+48]mm · 6 of 48 slices shown]
[im 1/48]
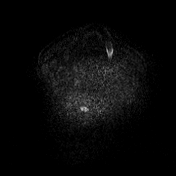
[im 10/48]
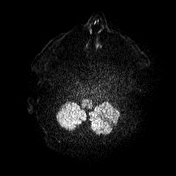
[im 19/48]
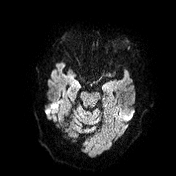
[im 29/48]
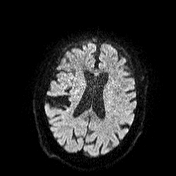
[im 38/48]
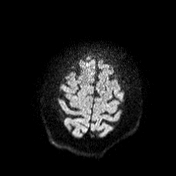
[im 48/48]
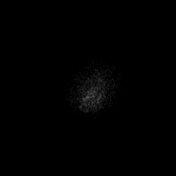

[Series 3: ax dwi_adc · axial · 3.0mm · 1.31mm/px · z∈[-106,+48]mm · 5 of 48 slices shown]
[im 1/48]
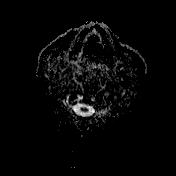
[im 12/48]
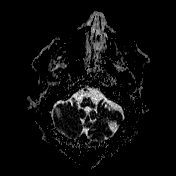
[im 24/48]
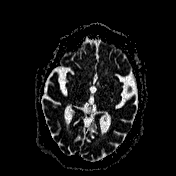
[im 36/48]
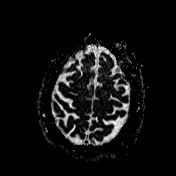
[im 48/48]
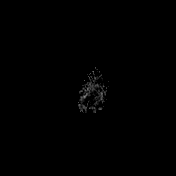

[Series 4: cor dwi_tracew · coronal · 5.0mm · 1.31mm/px · 4 of 38 slices shown]
[im 1/38]
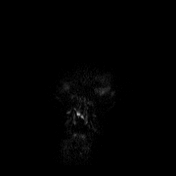
[im 13/38]
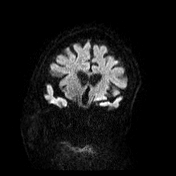
[im 25/38]
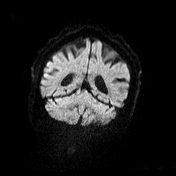
[im 38/38]
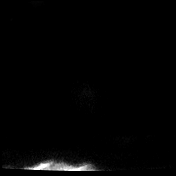

[Series 5: cor dwi_adc · coronal · 5.0mm · 1.31mm/px · 4 of 38 slices shown]
[im 1/38]
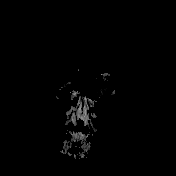
[im 13/38]
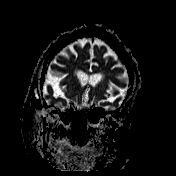
[im 25/38]
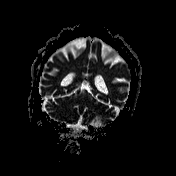
[im 38/38]
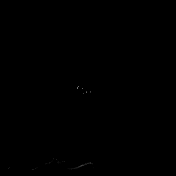

[Series 6: T2 · axial · 5.0mm · 0.45mm/px · z∈[-108,+47]mm · 3 of 27 slices shown (1 of 2)]
[im 1/27]
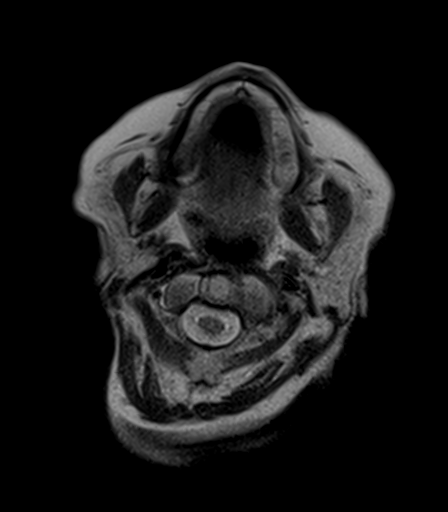
[im 14/27]
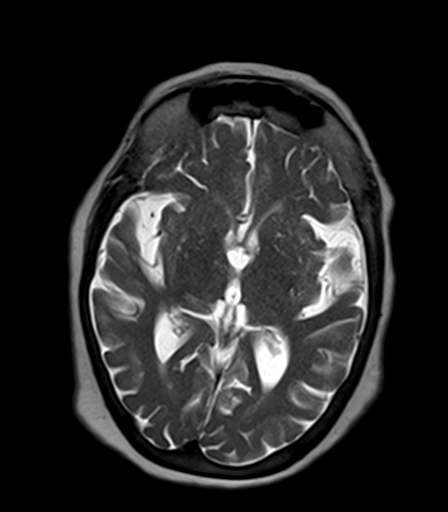
[im 27/27]
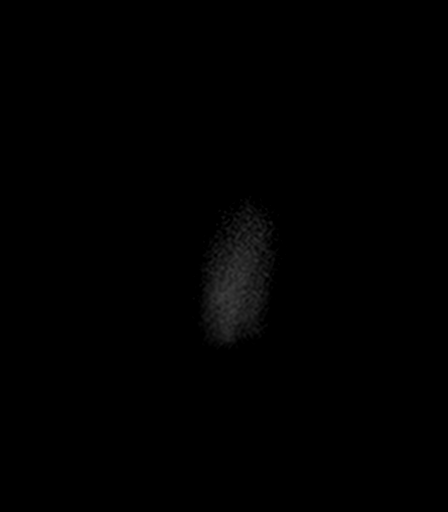

[Series 8: pha_images · axial · 3.0mm · 0.90mm/px · z∈[-118,+52]mm · 6 of 58 slices shown]
[im 1/58]
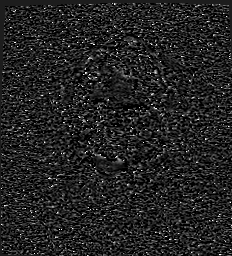
[im 12/58]
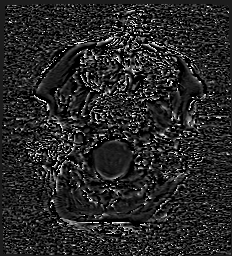
[im 23/58]
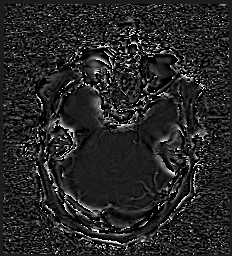
[im 35/58]
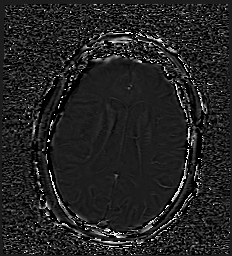
[im 46/58]
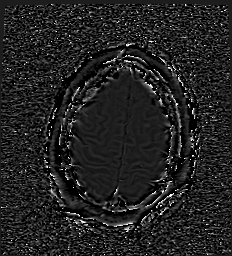
[im 58/58]
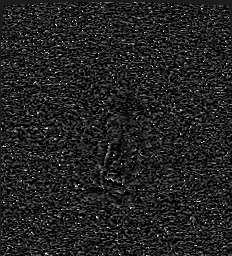

[Series 9: swi_images · axial · 3.0mm · 0.90mm/px · z∈[-118,+58]mm · 6 of 60 slices shown]
[im 1/60]
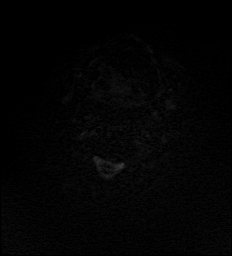
[im 12/60]
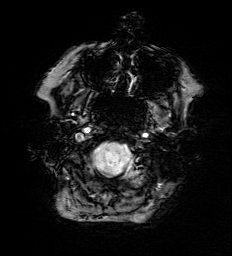
[im 24/60]
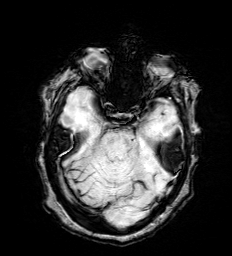
[im 36/60]
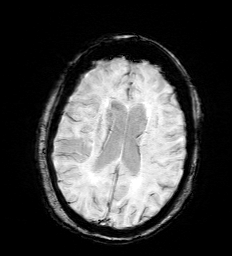
[im 48/60]
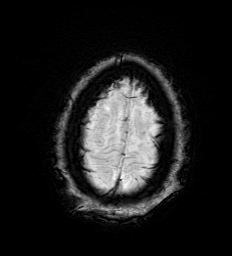
[im 60/60]
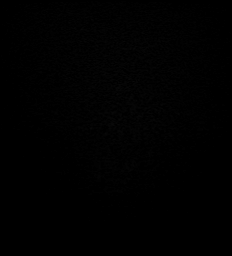

[Series 11: FLAIR · axial · 3.0mm · 0.53mm/px · z∈[-110,+51]mm · 6 of 55 slices shown]
[im 1/55]
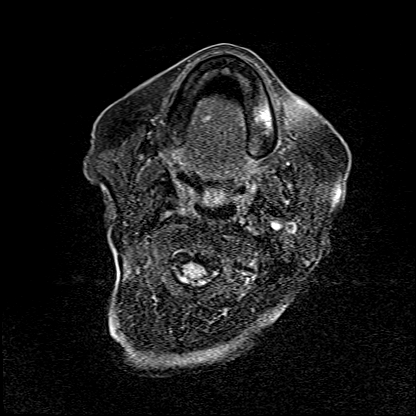
[im 11/55]
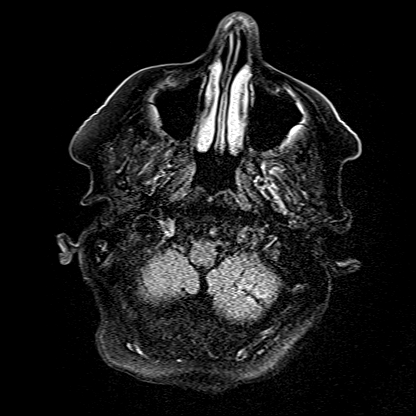
[im 22/55]
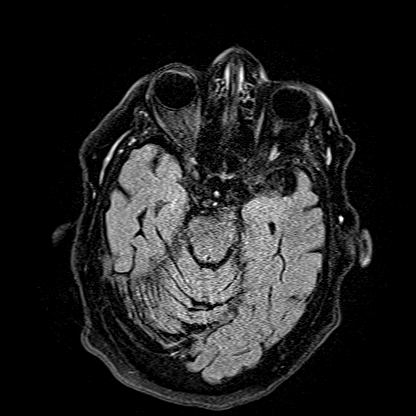
[im 33/55]
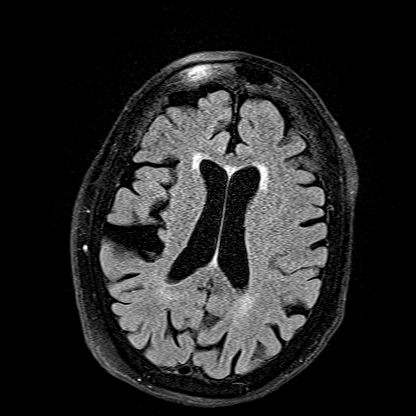
[im 44/55]
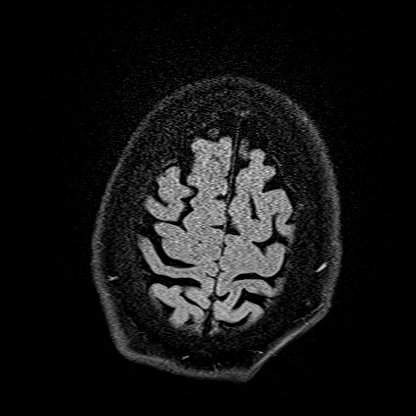
[im 55/55]
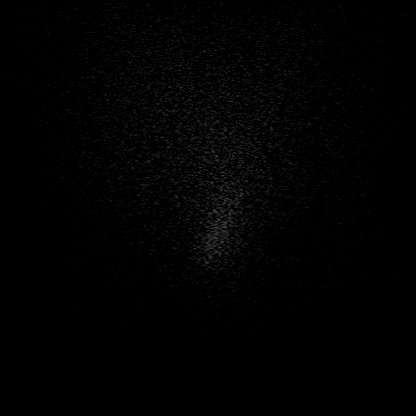

[Series 12: T1 · axial · 5.0mm · 0.90mm/px · z∈[-108,+47]mm · 3 of 27 slices shown (1 of 2)]
[im 1/27]
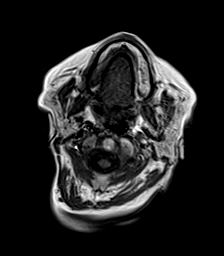
[im 14/27]
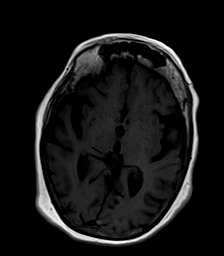
[im 27/27]
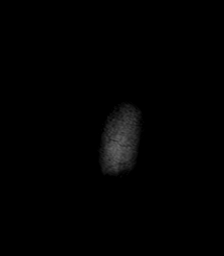

[Series 13: T1 · sagittal · 5.0mm · 0.94mm/px · 2 of 23 slices shown (2 of 2)]
[im 1/23]
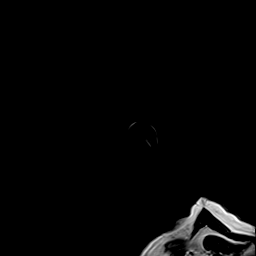
[im 23/23]
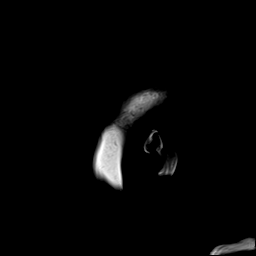

[Series 14: T2 · coronal · 5.0mm · 0.45mm/px · 3 of 31 slices shown (2 of 2)]
[im 1/31]
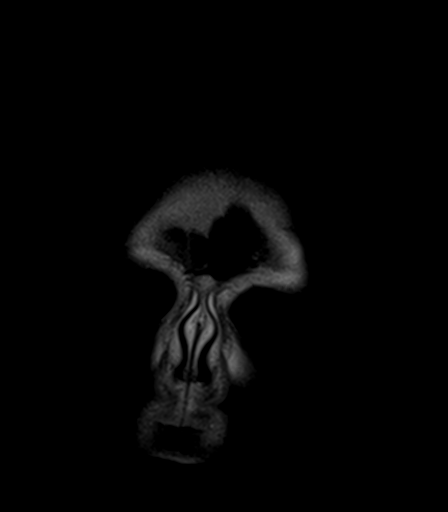
[im 16/31]
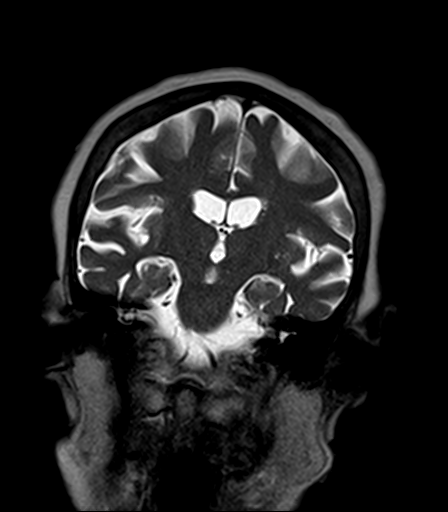
[im 31/31]
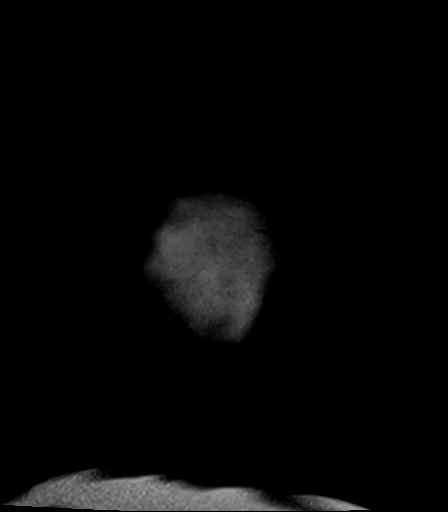

[48 of 48 positions shown; findings below may reference images not displayed]

FINDINGS: Brain: No restricted diffusion to suggest acute infarction. No
midline shift, mass effect, evidence of mass lesion,
ventriculomegaly, extra-axial collection or acute intracranial
hemorrhage. Cervicomedullary junction and pituitary are within
normal limits.

Stable gray and white matter signal throughout the brain.

Small chronic infarcts in the left cerebellum. Small area of chronic
cortical encephalomalacia at the posterior operculum. Chronic
microhemorrhage in the left anterior centrum semiovale. Negative for
age gray and white matter signal elsewhere.

Vascular: Major intracranial vascular flow voids are stable since
5459.

Skull and upper cervical spine: Partially visible advanced
widespread cervical spine degeneration. Multilevel degenerative
spondylolisthesis (series 13, image 11. Visualized bone marrow
signal is within normal limits.

Sinuses/Orbits: Stable and negative.

Other: Mastoids remain clear. Visible internal auditory structures
appear normal. Scalp and face soft tissues appear negative.
IMPRESSION: No acute intracranial abnormality.

Stable non-contrast MRI appearance of the brain since 5459, small
chronic infarcts in the left cerebellum and right operculum.

## 2019-09-23 IMAGING — US US ABDOMEN LIMITED
1 series · 14 of 25 positions shown · non-contrast
Comparison: No recent prior.

CLINICAL DATA: Abdominal pain.  Cholecystectomy.

EXAM:
ULTRASOUND ABDOMEN LIMITED RIGHT UPPER QUADRANT

[Series 1: us abdomen limited · 14 of 30 slices shown]
[im 1/30]
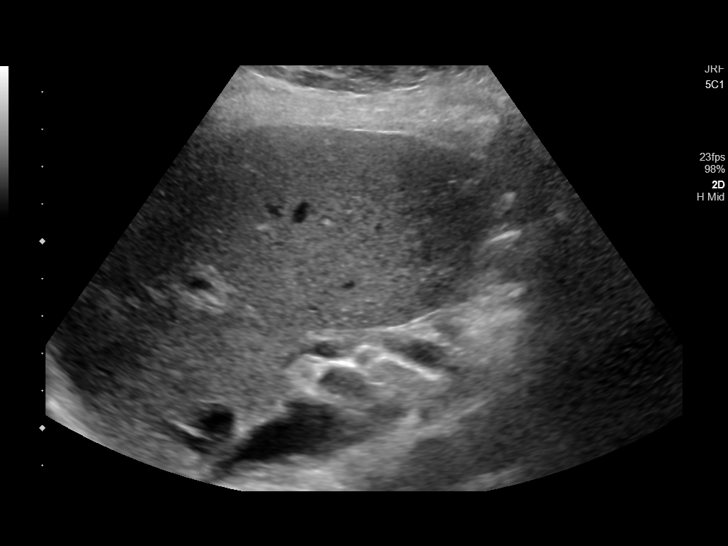
[im 3/30]
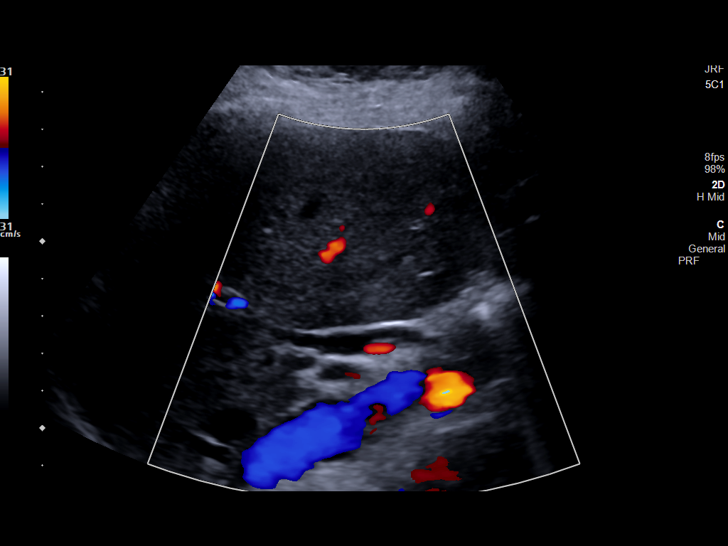
[im 5/30]
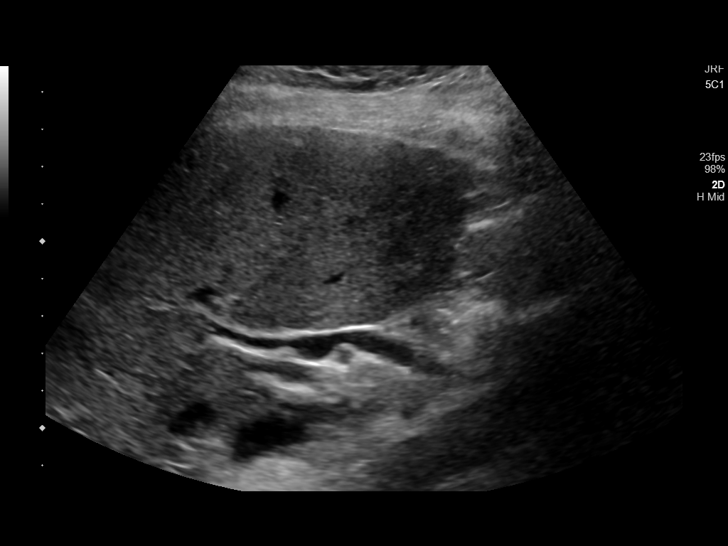
[im 8/30]
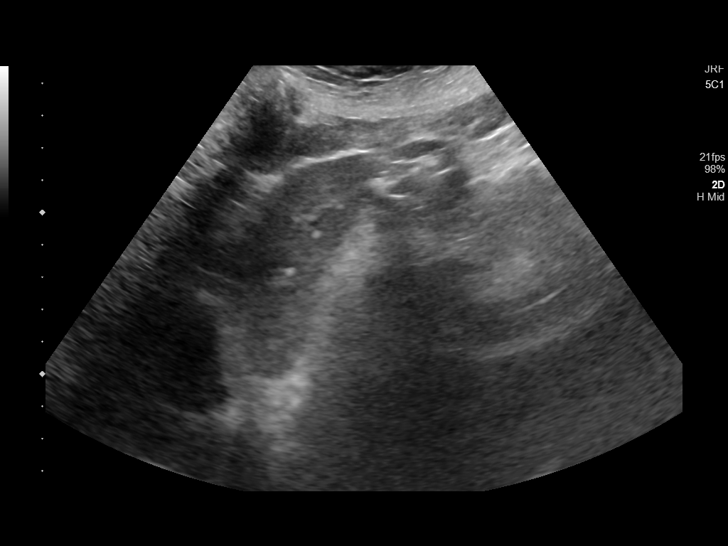
[im 10/30]
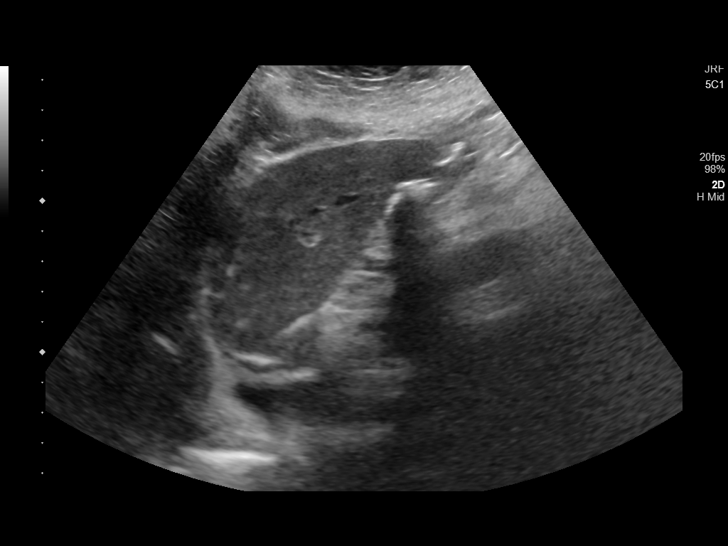
[im 11/30]
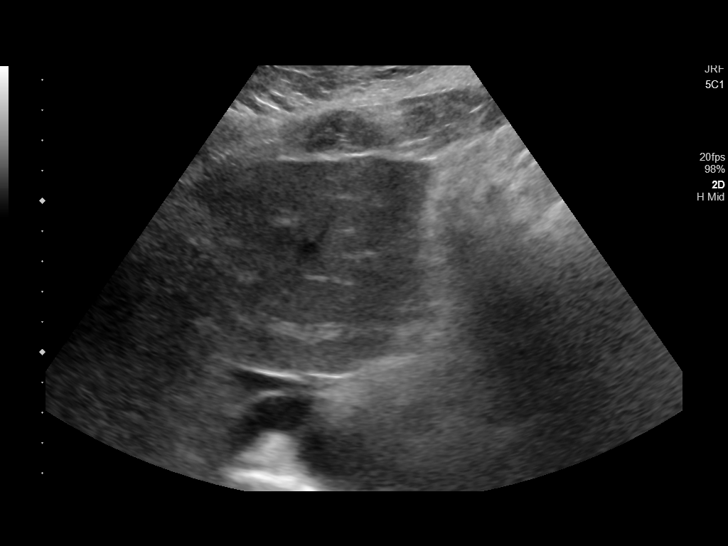
[im 14/30]
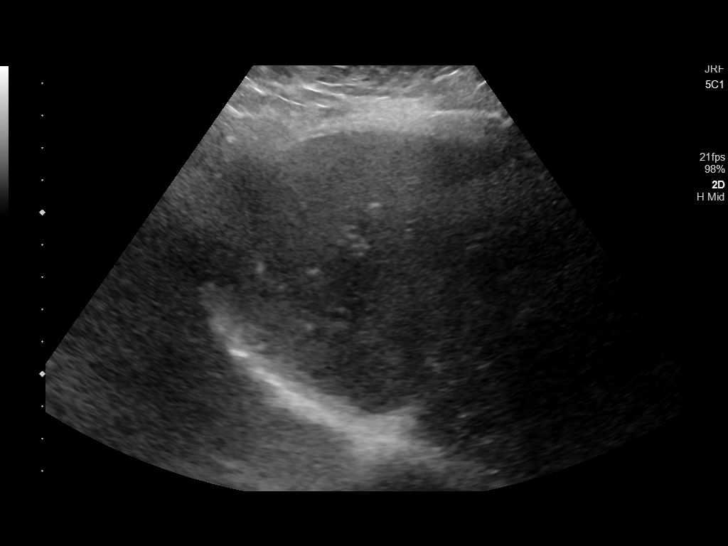
[im 16/30]
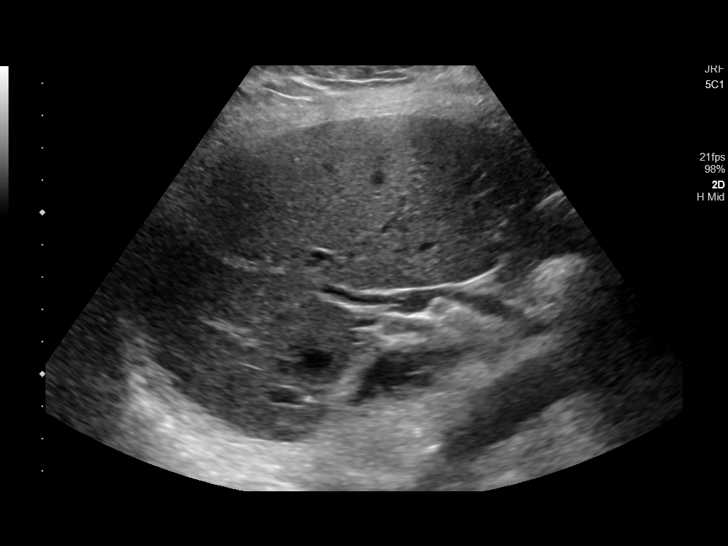
[im 19/30]
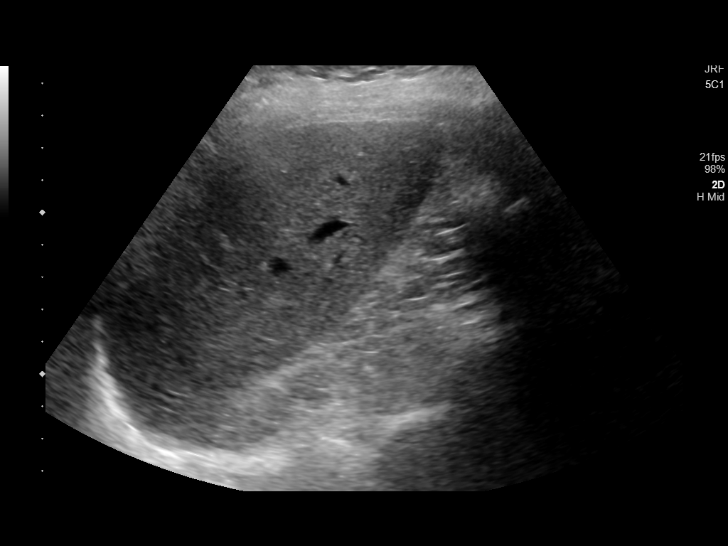
[im 20/30]
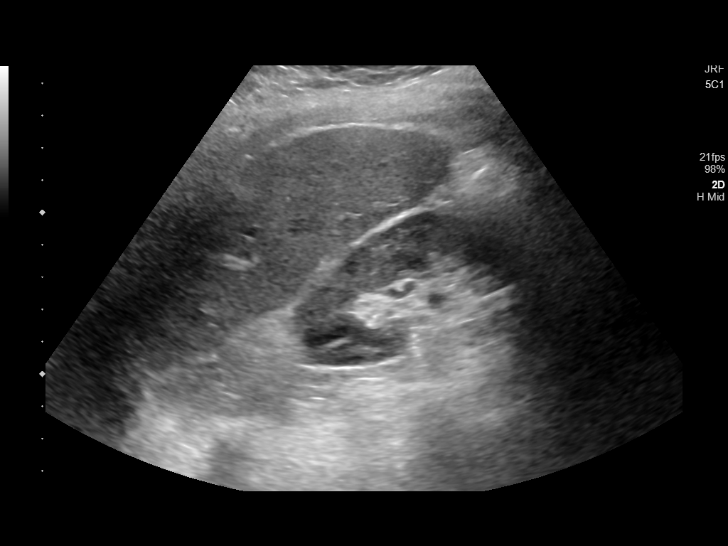
[im 22/30]
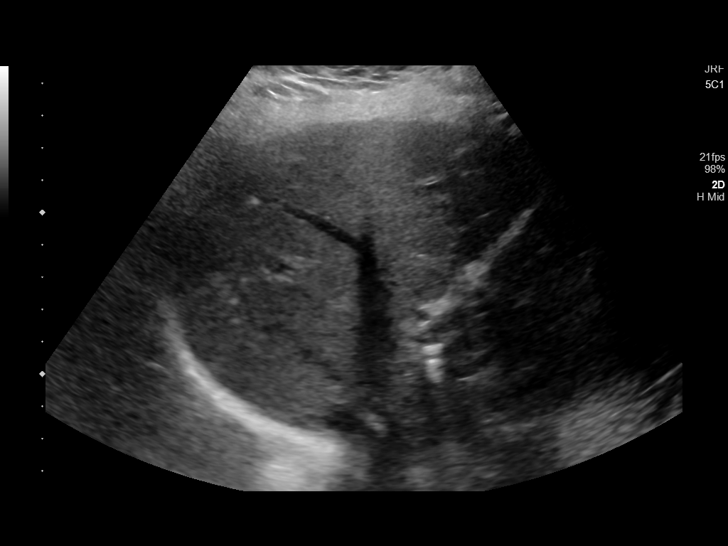
[im 25/30]
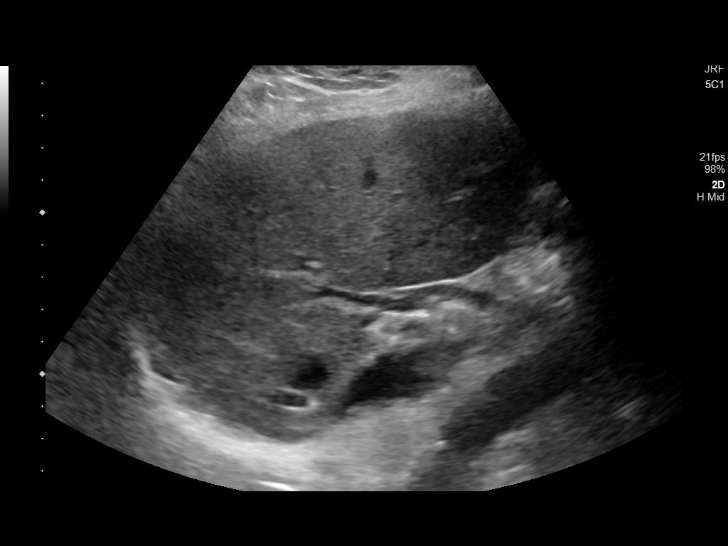
[im 27/30]
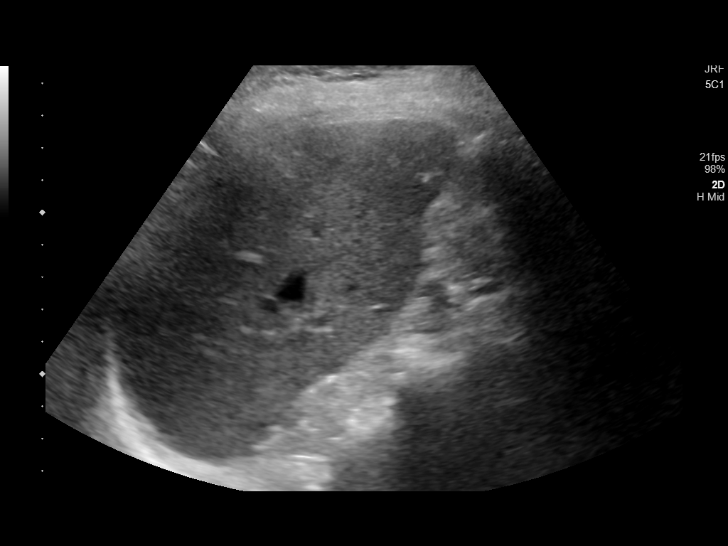
[im 30/30]
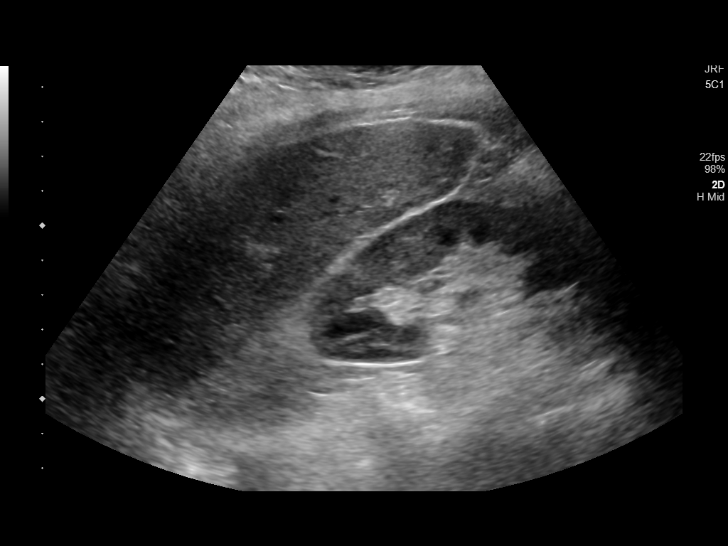

[14 of 25 positions shown; findings below may reference images not displayed]

FINDINGS: Gallbladder:

Cholecystectomy.

Common bile duct:

Diameter: 6.7 mm

Liver:

No focal lesion identified. Within normal limits in parenchymal
echogenicity. Portal vein is patent on color Doppler imaging with
normal direction of blood flow towards the liver.

Other: None.
IMPRESSION: 1.  Prior cholecystectomy.  No biliary distention.

2.  No acute abnormality identified.
